# Patient Record
Sex: Female | Born: 1985 | Race: White | Hispanic: No | Smoking: Never smoker
Health system: Southern US, Community
[De-identification: ages and names within clinical notes are randomized; demographics above are authoritative.]

## PROBLEM LIST (undated history)

## (undated) ENCOUNTER — Inpatient Hospital Stay (HOSPITAL_COMMUNITY): Payer: Self-pay

## (undated) DIAGNOSIS — D649 Anemia, unspecified: Secondary | ICD-10-CM

## (undated) DIAGNOSIS — E669 Obesity, unspecified: Secondary | ICD-10-CM

## (undated) DIAGNOSIS — N83209 Unspecified ovarian cyst, unspecified side: Secondary | ICD-10-CM

## (undated) DIAGNOSIS — I10 Essential (primary) hypertension: Secondary | ICD-10-CM

## (undated) HISTORY — PX: TUBAL LIGATION: SHX77

## (undated) HISTORY — DX: Unspecified ovarian cyst, unspecified side: N83.209

## (undated) HISTORY — DX: Essential (primary) hypertension: I10

## (undated) HISTORY — DX: Obesity, unspecified: E66.9

## (undated) SURGERY — Surgical Case
Anesthesia: *Unknown

---
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
## 2009-05-16 ENCOUNTER — Emergency Department (HOSPITAL_COMMUNITY): Admission: EM | Admit: 2009-05-16 | Discharge: 2009-05-16 | Payer: Self-pay | Admitting: Emergency Medicine

## 2009-07-04 ENCOUNTER — Other Ambulatory Visit: Admission: RE | Admit: 2009-07-04 | Discharge: 2009-07-04 | Payer: Self-pay | Admitting: Obstetrics & Gynecology

## 2009-09-09 ENCOUNTER — Emergency Department (HOSPITAL_COMMUNITY): Admission: EM | Admit: 2009-09-09 | Discharge: 2009-09-09 | Payer: Self-pay | Admitting: Emergency Medicine

## 2009-12-17 ENCOUNTER — Ambulatory Visit: Payer: Self-pay | Admitting: Physician Assistant

## 2009-12-17 ENCOUNTER — Inpatient Hospital Stay (HOSPITAL_COMMUNITY): Admission: AD | Admit: 2009-12-17 | Discharge: 2009-12-17 | Payer: Self-pay | Admitting: Obstetrics and Gynecology

## 2010-01-13 ENCOUNTER — Inpatient Hospital Stay (HOSPITAL_COMMUNITY): Admission: AD | Admit: 2010-01-13 | Discharge: 2010-01-17 | Payer: Self-pay | Admitting: Obstetrics and Gynecology

## 2010-02-26 ENCOUNTER — Other Ambulatory Visit: Admission: RE | Admit: 2010-02-26 | Discharge: 2010-02-26 | Payer: Self-pay | Admitting: Obstetrics & Gynecology

## 2010-10-26 ENCOUNTER — Inpatient Hospital Stay (HOSPITAL_COMMUNITY)
Admission: AD | Admit: 2010-10-26 | Discharge: 2010-10-26 | Payer: Self-pay | Source: Home / Self Care | Attending: Family Medicine | Admitting: Family Medicine

## 2011-01-28 LAB — CBC
Hemoglobin: 13 g/dL (ref 12.0–15.0)
MCH: 29.3 pg (ref 26.0–34.0)
MCHC: 33.5 g/dL (ref 30.0–36.0)
RDW: 13.8 % (ref 11.5–15.5)

## 2011-01-28 LAB — POCT PREGNANCY, URINE: Preg Test, Ur: NEGATIVE

## 2011-02-03 LAB — URINALYSIS, ROUTINE W REFLEX MICROSCOPIC
Bilirubin Urine: NEGATIVE
Ketones, ur: NEGATIVE mg/dL
Nitrite: NEGATIVE
Protein, ur: NEGATIVE mg/dL
pH: 6 (ref 5.0–8.0)

## 2011-02-06 LAB — CBC
HCT: 35.4 % — ABNORMAL LOW (ref 36.0–46.0)
Hemoglobin: 11.9 g/dL — ABNORMAL LOW (ref 12.0–15.0)
MCHC: 33.5 g/dL (ref 30.0–36.0)
MCHC: 33.6 g/dL (ref 30.0–36.0)
MCV: 88.4 fL (ref 78.0–100.0)
Platelets: 162 10*3/uL (ref 150–400)
Platelets: 213 10*3/uL (ref 150–400)
RDW: 16 % — ABNORMAL HIGH (ref 11.5–15.5)

## 2011-02-25 LAB — CBC
Hemoglobin: 14.1 g/dL (ref 12.0–15.0)
RBC: 4.47 MIL/uL (ref 3.87–5.11)
RDW: 13.1 % (ref 11.5–15.5)
WBC: 9.9 10*3/uL (ref 4.0–10.5)

## 2011-02-25 LAB — URINALYSIS, ROUTINE W REFLEX MICROSCOPIC
Bilirubin Urine: NEGATIVE
Ketones, ur: NEGATIVE mg/dL
Nitrite: NEGATIVE
Protein, ur: NEGATIVE mg/dL
Urobilinogen, UA: 1 mg/dL (ref 0.0–1.0)

## 2011-02-25 LAB — RPR: RPR Ser Ql: NONREACTIVE

## 2011-02-25 LAB — BASIC METABOLIC PANEL
Calcium: 9.4 mg/dL (ref 8.4–10.5)
GFR calc Af Amer: >60 mL/min (ref >60)
GFR calc non Af Amer: >60 mL/min (ref >60)
Glucose, Bld: 77 mg/dL (ref 70–99)
Sodium: 136 mEq/L (ref 135–145)

## 2011-02-25 LAB — DIFFERENTIAL
Basophils Absolute: 0 10*3/uL (ref 0.0–0.1)
Lymphocytes Relative: 35 % (ref 12–46)
Monocytes Absolute: 0.6 10*3/uL (ref 0.1–1.0)
Neutro Abs: 5.7 10*3/uL (ref 1.7–7.7)

## 2011-02-25 LAB — GC/CHLAMYDIA PROBE AMP, GENITAL
Chlamydia, DNA Probe: NEGATIVE
GC Probe Amp, Genital: NEGATIVE

## 2011-02-25 LAB — PREGNANCY, URINE: Preg Test, Ur: POSITIVE

## 2012-02-28 ENCOUNTER — Other Ambulatory Visit (HOSPITAL_COMMUNITY)
Admission: RE | Admit: 2012-02-28 | Discharge: 2012-02-28 | Disposition: A | Discharge status: Home / Self Care | Payer: No Typology Code available for payment source | Source: Ambulatory Visit | Attending: Obstetrics and Gynecology | Admitting: Obstetrics and Gynecology

## 2012-02-28 ENCOUNTER — Other Ambulatory Visit: Payer: Self-pay | Admitting: Adult Health

## 2012-02-28 DIAGNOSIS — Z01419 Encounter for gynecological examination (general) (routine) without abnormal findings: Secondary | ICD-10-CM | POA: Insufficient documentation

## 2012-12-04 ENCOUNTER — Encounter (HOSPITAL_COMMUNITY): Payer: Self-pay | Admitting: Radiology

## 2012-12-04 ENCOUNTER — Emergency Department (HOSPITAL_COMMUNITY)
Admission: EM | Admit: 2012-12-04 | Discharge: 2012-12-04 | Disposition: A | Discharge status: Home / Self Care | Payer: No Typology Code available for payment source | Attending: Emergency Medicine | Admitting: Emergency Medicine

## 2012-12-04 ENCOUNTER — Emergency Department (HOSPITAL_COMMUNITY): Payer: No Typology Code available for payment source

## 2012-12-04 DIAGNOSIS — Z3202 Encounter for pregnancy test, result negative: Secondary | ICD-10-CM | POA: Insufficient documentation

## 2012-12-04 DIAGNOSIS — Y92410 Unspecified street and highway as the place of occurrence of the external cause: Secondary | ICD-10-CM | POA: Diagnosis not present

## 2012-12-04 DIAGNOSIS — S3690XA Unspecified injury of unspecified intra-abdominal organ, initial encounter: Secondary | ICD-10-CM | POA: Insufficient documentation

## 2012-12-04 DIAGNOSIS — Y9389 Activity, other specified: Secondary | ICD-10-CM | POA: Diagnosis not present

## 2012-12-04 DIAGNOSIS — S99919A Unspecified injury of unspecified ankle, initial encounter: Secondary | ICD-10-CM | POA: Diagnosis present

## 2012-12-04 DIAGNOSIS — S8990XA Unspecified injury of unspecified lower leg, initial encounter: Secondary | ICD-10-CM | POA: Diagnosis present

## 2012-12-04 DIAGNOSIS — T148XXA Other injury of unspecified body region, initial encounter: Secondary | ICD-10-CM | POA: Diagnosis not present

## 2012-12-04 DIAGNOSIS — S298XXA Other specified injuries of thorax, initial encounter: Secondary | ICD-10-CM | POA: Insufficient documentation

## 2012-12-04 LAB — URINALYSIS, ROUTINE W REFLEX MICROSCOPIC
Bilirubin Urine: NEGATIVE
Glucose, UA: NEGATIVE mg/dL
Ketones, ur: NEGATIVE mg/dL
Leukocytes, UA: NEGATIVE
Protein, ur: NEGATIVE mg/dL
pH: 6 (ref 5.0–8.0)

## 2012-12-04 LAB — POCT PREGNANCY, URINE: Preg Test, Ur: NEGATIVE

## 2012-12-04 MED ORDER — OXYCODONE-ACETAMINOPHEN 5-325 MG PO TABS: 2.0000 | ORAL_TABLET | ORAL | Status: DC | PRN

## 2012-12-04 MED ORDER — CYCLOBENZAPRINE HCL 10 MG PO TABS: 10.0000 mg | ORAL_TABLET | Freq: Two times a day (BID) | ORAL | Status: DC | PRN

## 2012-12-04 MED ORDER — KETOROLAC TROMETHAMINE 30 MG/ML IJ SOLN
30.0000 mg | Freq: Once | INTRAMUSCULAR | Status: AC
  Administered 2012-12-04: 30 mg via INTRAVENOUS
  Filled 2012-12-04: qty 1

## 2012-12-04 MED ORDER — IOHEXOL 300 MG/ML  SOLN
100.0000 mL | Freq: Once | INTRAMUSCULAR | Status: AC | PRN
  Administered 2012-12-04: 100 mL via INTRAVENOUS

## 2012-12-04 MED ORDER — DIAZEPAM 5 MG/ML IJ SOLN
5.0000 mg | Freq: Once | INTRAMUSCULAR | Status: AC
  Administered 2012-12-04: 5 mg via INTRAVENOUS
  Filled 2012-12-04: qty 2

## 2012-12-04 MED ORDER — OXYCODONE-ACETAMINOPHEN 5-325 MG PO TABS
2.0000 | ORAL_TABLET | Freq: Once | ORAL | Status: AC
  Administered 2012-12-04: 2 via ORAL
  Filled 2012-12-04: qty 2

## 2012-12-04 NOTE — ED Notes (Signed)
Pt transported to CT ?

## 2012-12-04 NOTE — ED Notes (Signed)
PA at bedside.

## 2012-12-04 NOTE — ED Provider Notes (Signed)
Medical screening examination/treatment/procedure(s) were conducted as a shared visit with non-physician practitioner(s) and myself.  I personally evaluated the patient during the encounter  See my additional note  Glynn Octave, MD 12/04/12 1630
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
## 2012-12-06 ENCOUNTER — Encounter (HOSPITAL_COMMUNITY): Payer: Self-pay | Admitting: Nurse Practitioner

## 2012-12-06 ENCOUNTER — Emergency Department (HOSPITAL_COMMUNITY)
Admission: EM | Admit: 2012-12-06 | Discharge: 2012-12-06 | Disposition: A | Discharge status: Home / Self Care | Payer: No Typology Code available for payment source | Attending: Emergency Medicine | Admitting: Emergency Medicine

## 2012-12-06 DIAGNOSIS — G44309 Post-traumatic headache, unspecified, not intractable: Secondary | ICD-10-CM | POA: Insufficient documentation

## 2012-12-06 DIAGNOSIS — R51 Headache: Secondary | ICD-10-CM

## 2012-12-06 DIAGNOSIS — R5381 Other malaise: Secondary | ICD-10-CM | POA: Insufficient documentation

## 2012-12-06 DIAGNOSIS — Z3202 Encounter for pregnancy test, result negative: Secondary | ICD-10-CM | POA: Insufficient documentation

## 2012-12-06 LAB — URINALYSIS, ROUTINE W REFLEX MICROSCOPIC
Glucose, UA: NEGATIVE mg/dL
Ketones, ur: NEGATIVE mg/dL
Leukocytes, UA: NEGATIVE
Nitrite: NEGATIVE
Specific Gravity, Urine: 1.016 (ref 1.005–1.030)
pH: 8 (ref 5.0–8.0)

## 2012-12-06 LAB — POCT PREGNANCY, URINE: Preg Test, Ur: NEGATIVE

## 2012-12-06 MED ORDER — DIPHENHYDRAMINE HCL 50 MG/ML IJ SOLN
25.0000 mg | Freq: Once | INTRAMUSCULAR | Status: AC
  Administered 2012-12-06: 25 mg via INTRAVENOUS
  Filled 2012-12-06: qty 1

## 2012-12-06 MED ORDER — METOCLOPRAMIDE HCL 5 MG/ML IJ SOLN
10.0000 mg | Freq: Once | INTRAMUSCULAR | Status: AC
  Administered 2012-12-06: 10 mg via INTRAVENOUS
  Filled 2012-12-06: qty 2

## 2012-12-06 MED ORDER — SODIUM CHLORIDE 0.9 % IV BOLUS (SEPSIS)
1000.0000 mL | Freq: Once | INTRAVENOUS | Status: AC
  Administered 2012-12-06: 1000 mL via INTRAVENOUS

## 2012-12-06 MED ORDER — KETOROLAC TROMETHAMINE 30 MG/ML IJ SOLN
30.0000 mg | Freq: Once | INTRAMUSCULAR | Status: AC
  Administered 2012-12-06: 30 mg via INTRAVENOUS
  Filled 2012-12-06: qty 1
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
## 2012-12-06 NOTE — ED Notes (Signed)
Discharge instructions reviewed. Pt verbalized understanding.  

## 2012-12-07 ENCOUNTER — Telehealth (HOSPITAL_COMMUNITY): Payer: Self-pay | Admitting: Orthopedic Surgery
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
## 2012-12-10 NOTE — ED Provider Notes (Signed)
Medical screening examination/treatment/procedure(s) were performed by non-physician practitioner and as supervising physician I was immediately available for consultation/collaboration.   Loren Racer, MD 12/10/12 1505

## 2012-12-14 NOTE — ED Provider Notes (Signed)
Medical screening examination/treatment/procedure(s) were performed by non-physician practitioner and as supervising physician I was immediately available for consultation/collaboration.   Carleene Cooper III, MD 12/14/12 2025

## 2013-01-05 LAB — OB RESULTS CONSOLE HIV ANTIBODY (ROUTINE TESTING): HIV: NONREACTIVE

## 2013-01-05 LAB — OB RESULTS CONSOLE ABO/RH: RH Type: POSITIVE

## 2013-01-05 LAB — OB RESULTS CONSOLE RPR: RPR: NONREACTIVE

## 2013-01-12 LAB — OB RESULTS CONSOLE GBS: GBS: POSITIVE

## 2013-01-19 ENCOUNTER — Encounter: Payer: Self-pay | Admitting: *Deleted

## 2013-01-25 ENCOUNTER — Other Ambulatory Visit: Payer: Self-pay | Admitting: Obstetrics & Gynecology

## 2013-01-25 DIAGNOSIS — Z36 Encounter for antenatal screening of mother: Secondary | ICD-10-CM

## 2013-02-02 ENCOUNTER — Other Ambulatory Visit: Payer: Self-pay | Admitting: Advanced Practice Midwife

## 2013-02-02 ENCOUNTER — Ambulatory Visit (INDEPENDENT_AMBULATORY_CARE_PROVIDER_SITE_OTHER): Payer: No Typology Code available for payment source | Admitting: Advanced Practice Midwife

## 2013-02-02 ENCOUNTER — Ambulatory Visit (INDEPENDENT_AMBULATORY_CARE_PROVIDER_SITE_OTHER): Payer: No Typology Code available for payment source

## 2013-02-02 ENCOUNTER — Encounter: Payer: Self-pay | Admitting: Advanced Practice Midwife

## 2013-02-02 VITALS — BP 122/64 | Wt 180.8 lb

## 2013-02-02 DIAGNOSIS — Z1389 Encounter for screening for other disorder: Secondary | ICD-10-CM

## 2013-02-02 DIAGNOSIS — O10919 Unspecified pre-existing hypertension complicating pregnancy, unspecified trimester: Secondary | ICD-10-CM | POA: Insufficient documentation

## 2013-02-02 DIAGNOSIS — O10911 Unspecified pre-existing hypertension complicating pregnancy, first trimester: Secondary | ICD-10-CM

## 2013-02-02 DIAGNOSIS — O34219 Maternal care for unspecified type scar from previous cesarean delivery: Secondary | ICD-10-CM

## 2013-02-02 DIAGNOSIS — Z3491 Encounter for supervision of normal pregnancy, unspecified, first trimester: Secondary | ICD-10-CM

## 2013-02-02 DIAGNOSIS — O10019 Pre-existing essential hypertension complicating pregnancy, unspecified trimester: Secondary | ICD-10-CM

## 2013-02-02 DIAGNOSIS — O99891 Other specified diseases and conditions complicating pregnancy: Secondary | ICD-10-CM

## 2013-02-02 DIAGNOSIS — Z331 Pregnant state, incidental: Secondary | ICD-10-CM

## 2013-02-02 DIAGNOSIS — Z36 Encounter for antenatal screening of mother: Secondary | ICD-10-CM

## 2013-02-02 LAB — POCT URINALYSIS DIPSTICK
Glucose, UA: NEGATIVE
Leukocytes, UA: NEGATIVE
Protein, UA: NEGATIVE

## 2013-02-02 NOTE — Progress Notes (Signed)
Pt reports swelling in the hands.

## 2013-02-02 NOTE — Addendum Note (Signed)
Addended by: Jacklyn Shell on: 02/02/2013 05:44 PM   Modules accepted: Orders

## 2013-02-03 NOTE — Progress Notes (Signed)
No c/o at this time.  Routine questions about pregnancy andswered.  F/U in 4 weeks for pnv and 2nd IT.

## 2013-02-07 LAB — MATERNAL SCREEN, INTEGRATED #1
Number of fetuses: 1
Referring Physician NPI: 1881783975

## 2013-03-02 ENCOUNTER — Ambulatory Visit (INDEPENDENT_AMBULATORY_CARE_PROVIDER_SITE_OTHER): Payer: BC Managed Care – PPO | Admitting: Advanced Practice Midwife

## 2013-03-02 ENCOUNTER — Other Ambulatory Visit: Payer: Self-pay | Admitting: Advanced Practice Midwife

## 2013-03-02 VITALS — BP 102/78 | Wt 184.5 lb

## 2013-03-02 DIAGNOSIS — O10911 Unspecified pre-existing hypertension complicating pregnancy, first trimester: Secondary | ICD-10-CM

## 2013-03-02 DIAGNOSIS — O99891 Other specified diseases and conditions complicating pregnancy: Secondary | ICD-10-CM

## 2013-03-02 DIAGNOSIS — O34219 Maternal care for unspecified type scar from previous cesarean delivery: Secondary | ICD-10-CM

## 2013-03-02 DIAGNOSIS — O09519 Supervision of elderly primigravida, unspecified trimester: Secondary | ICD-10-CM

## 2013-03-02 DIAGNOSIS — O10019 Pre-existing essential hypertension complicating pregnancy, unspecified trimester: Secondary | ICD-10-CM

## 2013-03-02 LAB — POCT URINALYSIS DIPSTICK
Glucose, UA: NEGATIVE
Nitrite, UA: NEGATIVE

## 2013-03-02 NOTE — Progress Notes (Signed)
Occ nosebleeds.  Try saline spray.  Feels jittery at times, several hours at a time. ? Hypoglycemia.  Instructed to seek care during an episode. Had 2nd IT today Routine questions about pregnancy answered.  F/U in 3 weeks for anatomy scan.

## 2013-03-02 NOTE — Progress Notes (Signed)
C/o nose bleeds and "don't feel right and jittery"

## 2013-03-08 LAB — MATERNAL SCREEN, INTEGRATED #2
AFP MoM: 0.85
Crown Rump Length: 64.7 mm
Estriol Mom: 0.94
Estriol, Free: 0.59 ng/mL
MSS Trisomy 18 Risk: <1:5000 {titer}
Nuchal Translucency: 1.61 mm
PAPP-A MoM: 0.58
hCG, Serum: 34.3 IU/mL

## 2013-03-11 ENCOUNTER — Encounter (HOSPITAL_COMMUNITY): Payer: Self-pay | Admitting: *Deleted

## 2013-03-11 ENCOUNTER — Telehealth: Payer: Self-pay | Admitting: *Deleted

## 2013-03-11 ENCOUNTER — Inpatient Hospital Stay (HOSPITAL_COMMUNITY)
Admission: AD | Admit: 2013-03-11 | Discharge: 2013-03-11 | Disposition: A | Discharge status: Home / Self Care | Payer: BC Managed Care – PPO | Source: Ambulatory Visit | Attending: Obstetrics & Gynecology | Admitting: Obstetrics & Gynecology

## 2013-03-11 DIAGNOSIS — O10019 Pre-existing essential hypertension complicating pregnancy, unspecified trimester: Secondary | ICD-10-CM | POA: Insufficient documentation

## 2013-03-11 DIAGNOSIS — O10911 Unspecified pre-existing hypertension complicating pregnancy, first trimester: Secondary | ICD-10-CM

## 2013-03-11 DIAGNOSIS — N949 Unspecified condition associated with female genital organs and menstrual cycle: Secondary | ICD-10-CM

## 2013-03-11 DIAGNOSIS — O34219 Maternal care for unspecified type scar from previous cesarean delivery: Secondary | ICD-10-CM

## 2013-03-11 DIAGNOSIS — O99891 Other specified diseases and conditions complicating pregnancy: Secondary | ICD-10-CM | POA: Insufficient documentation

## 2013-03-11 DIAGNOSIS — R109 Unspecified abdominal pain: Secondary | ICD-10-CM | POA: Insufficient documentation

## 2013-03-11 LAB — URINALYSIS, ROUTINE W REFLEX MICROSCOPIC
Hgb urine dipstick: NEGATIVE
Leukocytes, UA: NEGATIVE
Nitrite: NEGATIVE
Specific Gravity, Urine: 1.025 (ref 1.005–1.030)
Urobilinogen, UA: 0.2 mg/dL (ref 0.0–1.0)

## 2013-03-11 NOTE — Telephone Encounter (Signed)
Pt c/o cramping at 17 weeks of pregnancy, no bleeding. Informed pt to try OTC Tylenol, push fluids and to rest if no improvement to call office back, Pt informed to go to MAU with severe cramping and bleeding were to occur. Pt verbalized understanding.
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
## 2013-03-11 NOTE — MAU Note (Signed)
Pt reports having some cramping on and off since yesterday. Cramping is strong today. Denies vag bleeding or discharge at this time.

## 2013-03-25 ENCOUNTER — Encounter: Payer: Self-pay | Admitting: Advanced Practice Midwife

## 2013-03-25 ENCOUNTER — Ambulatory Visit (INDEPENDENT_AMBULATORY_CARE_PROVIDER_SITE_OTHER): Payer: BC Managed Care – PPO | Admitting: Advanced Practice Midwife

## 2013-03-25 ENCOUNTER — Ambulatory Visit (INDEPENDENT_AMBULATORY_CARE_PROVIDER_SITE_OTHER): Payer: BC Managed Care – PPO

## 2013-03-25 ENCOUNTER — Other Ambulatory Visit: Payer: Self-pay | Admitting: Advanced Practice Midwife

## 2013-03-25 VITALS — BP 120/90 | Wt 187.0 lb

## 2013-03-25 DIAGNOSIS — O10012 Pre-existing essential hypertension complicating pregnancy, second trimester: Secondary | ICD-10-CM

## 2013-03-25 DIAGNOSIS — O0990 Supervision of high risk pregnancy, unspecified, unspecified trimester: Secondary | ICD-10-CM | POA: Insufficient documentation

## 2013-03-25 DIAGNOSIS — Z1389 Encounter for screening for other disorder: Secondary | ICD-10-CM

## 2013-03-25 DIAGNOSIS — O34219 Maternal care for unspecified type scar from previous cesarean delivery: Secondary | ICD-10-CM

## 2013-03-25 DIAGNOSIS — O162 Unspecified maternal hypertension, second trimester: Secondary | ICD-10-CM

## 2013-03-25 DIAGNOSIS — O09519 Supervision of elderly primigravida, unspecified trimester: Secondary | ICD-10-CM

## 2013-03-25 DIAGNOSIS — Z3492 Encounter for supervision of normal pregnancy, unspecified, second trimester: Secondary | ICD-10-CM

## 2013-03-25 DIAGNOSIS — Z331 Pregnant state, incidental: Secondary | ICD-10-CM

## 2013-03-25 DIAGNOSIS — O0992 Supervision of high risk pregnancy, unspecified, second trimester: Secondary | ICD-10-CM

## 2013-03-25 DIAGNOSIS — O99891 Other specified diseases and conditions complicating pregnancy: Secondary | ICD-10-CM

## 2013-03-25 DIAGNOSIS — O10019 Pre-existing essential hypertension complicating pregnancy, unspecified trimester: Secondary | ICD-10-CM

## 2013-03-25 DIAGNOSIS — O10912 Unspecified pre-existing hypertension complicating pregnancy, second trimester: Secondary | ICD-10-CM

## 2013-03-25 DIAGNOSIS — O10911 Unspecified pre-existing hypertension complicating pregnancy, first trimester: Secondary | ICD-10-CM

## 2013-03-25 LAB — POCT URINALYSIS DIPSTICK
Blood, UA: NEGATIVE
Protein, UA: NEGATIVE

## 2013-03-25 NOTE — Progress Notes (Signed)
U/S-19+4wks, active fetus, meas c/w dates, cx long and closed, fluid wnl, no major abnl noted,ant gr 0 plac,  Female fetus, bilateral adnexa wnl

## 2013-03-25 NOTE — Progress Notes (Signed)
"  Belly pain."

## 2013-03-25 NOTE — Progress Notes (Signed)
Had anatomy scan today. Still with round ligament pain,  Better with extra water.   DIdn't take BP med this am.  No c/o at this time.  Routine questions about pregnancy answered.  F/U in 4 weeks for growth ultrasound.

## 2013-03-29 ENCOUNTER — Telehealth: Payer: Self-pay | Admitting: Obstetrics and Gynecology

## 2013-03-29 NOTE — Telephone Encounter (Signed)
Pt states had stomach bug this past weekend, unable to keep anything down, called after nurse line and was given Zofran. Pt informed ok to take Zofran during pregnancy, to push fluids to prevent dehydration. Pt states Zofran is helping. Pt told to call office back if anymore complications. Pt verbalized understanding.

## 2013-03-30 LAB — US OB DETAIL + 14 WK

## 2013-04-18 ENCOUNTER — Encounter (HOSPITAL_COMMUNITY): Payer: Self-pay | Admitting: *Deleted

## 2013-04-18 ENCOUNTER — Inpatient Hospital Stay (HOSPITAL_COMMUNITY)
Admission: AD | Admit: 2013-04-18 | Discharge: 2013-04-18 | Disposition: A | Discharge status: Home / Self Care | Payer: BC Managed Care – PPO | Source: Ambulatory Visit | Attending: Obstetrics and Gynecology | Admitting: Obstetrics and Gynecology

## 2013-04-18 DIAGNOSIS — N949 Unspecified condition associated with female genital organs and menstrual cycle: Secondary | ICD-10-CM | POA: Insufficient documentation

## 2013-04-18 DIAGNOSIS — O10019 Pre-existing essential hypertension complicating pregnancy, unspecified trimester: Secondary | ICD-10-CM | POA: Insufficient documentation

## 2013-04-18 DIAGNOSIS — O99891 Other specified diseases and conditions complicating pregnancy: Secondary | ICD-10-CM | POA: Insufficient documentation

## 2013-04-18 DIAGNOSIS — O10912 Unspecified pre-existing hypertension complicating pregnancy, second trimester: Secondary | ICD-10-CM

## 2013-04-18 DIAGNOSIS — N898 Other specified noninflammatory disorders of vagina: Secondary | ICD-10-CM

## 2013-04-18 DIAGNOSIS — O26892 Other specified pregnancy related conditions, second trimester: Secondary | ICD-10-CM

## 2013-04-18 DIAGNOSIS — O0992 Supervision of high risk pregnancy, unspecified, second trimester: Secondary | ICD-10-CM

## 2013-04-18 DIAGNOSIS — O34219 Maternal care for unspecified type scar from previous cesarean delivery: Secondary | ICD-10-CM

## 2013-04-18 LAB — URINALYSIS, ROUTINE W REFLEX MICROSCOPIC
Glucose, UA: NEGATIVE mg/dL
Hgb urine dipstick: NEGATIVE
Ketones, ur: NEGATIVE mg/dL
Protein, ur: NEGATIVE mg/dL

## 2013-04-18 LAB — WET PREP, GENITAL: Yeast Wet Prep HPF POC: NONE SEEN

## 2013-04-18 NOTE — MAU Provider Note (Signed)
History     CSN: [id number]  Arrival date and time: 04/18/13 1804   None     Chief Complaint  Patient presents with  . Vaginal Discharge   HPI 27 y.o. G2P1001 at [redacted]w[redacted]d here for leaking of fluid over last few days. Feels her underwear has been wet past two days. No gush of fluid. Last intercourse 2 days ago. No itching, dysuria, bleeding or cramping/contractions. Baby moving well.   Gets care at [facility]. History chronic HTN, prior c-section for arrest of descent/dilation. No other complications of pregnancy.  OB History   Grav Para Term Preterm Abortions TAB SAB Ect Mult Living   2 1 1       1       Past Medical History  Diagnosis Date  . Hypertension     Past Surgical History  Procedure Laterality Date  . Cesarean section      Family History  Problem Relation Age of Onset  . Hypertension Mother   . Cancer Mother     skin  . Cancer Maternal Grandmother   . Hypertension Maternal Grandmother   . Cancer Other     breast    History  Substance Use Topics  . Smoking status: Never Smoker   . Smokeless tobacco: Not on file  . Alcohol Use: No    Allergies: No Known Allergies  Prescriptions prior to admission  Medication Sig Dispense Refill  . acetaminophen (TYLENOL) 500 MG tablet Take 1,000 mg by mouth every 6 (six) hours as needed for pain.      . calcium carbonate (TUMS - DOSED IN MG ELEMENTAL CALCIUM) 500 MG chewable tablet Chew 2-4 tablets by mouth 2 (two) times daily as needed for heartburn.      . methyldopa (ALDOMET) 500 MG tablet Take 500 mg by mouth 2 (two) times daily.       . prenatal vitamin w/FE, FA (PRENATAL 1 + 1) 27-1 MG TABS Take 1 tablet by mouth daily at 12 noon.        ROS See HPI   Physical Exam   Blood pressure 114/68, pulse 86, temperature 97.7 F (36.5 C), temperature source Oral, resp. rate 18, weight 88.451 kg (195 lb), last menstrual period 11/08/2012.  Physical Exam  Constitutional: She is oriented to person, place, and time.  She appears well-developed and well-nourished. No distress.  HENT:  Head: Normocephalic and atraumatic.  Eyes: Conjunctivae and EOM are normal.  Neck: Normal range of motion. Neck supple.  Cardiovascular: Normal rate.   Respiratory: Effort normal. No respiratory distress.  GI: Soft. There is no tenderness. There is no rebound and no guarding.  Genitourinary:  Normal external genitalia. Normal vagina, minimal discharge, no blood. Clear/white mucous at cervical os. Cervix appears closed and is closed/thick/high on digital exam. No CMT.  Musculoskeletal: Normal range of motion. She exhibits no edema and no tenderness.  Neurological: She is alert and oriented to person, place, and time.  Skin: Skin is warm and dry.  Psychiatric: She has a normal mood and affect.   Results for orders placed during the hospital encounter of 04/18/13 (from the past 24 hour(s))  URINALYSIS, ROUTINE W REFLEX MICROSCOPIC     Status: Abnormal   Collection Time    04/18/13  6:20 PM      Result Value Range   Color, Urine YELLOW  YELLOW   APPearance CLEAR  CLEAR   Specific Gravity, Urine >1.030 (*) 1.005 - 1.030   pH 6.0  5.0 -  8.0   Glucose, UA NEGATIVE  NEGATIVE mg/dL   Hgb urine dipstick NEGATIVE  NEGATIVE   Bilirubin Urine NEGATIVE  NEGATIVE   Ketones, ur NEGATIVE  NEGATIVE mg/dL   Protein, ur NEGATIVE  NEGATIVE mg/dL   Urobilinogen, UA 0.2  0.0 - 1.0 mg/dL   Nitrite NEGATIVE  NEGATIVE   Leukocytes, UA NEGATIVE  NEGATIVE  WET PREP, GENITAL     Status: Abnormal   Collection Time    04/18/13  6:40 PM      Result Value Range   Yeast Wet Prep HPF POC NONE SEEN  NONE SEEN   Trich, Wet Prep NONE SEEN  NONE SEEN   Clue Cells Wet Prep HPF POC NONE SEEN  NONE SEEN   WBC, Wet Prep HPF POC FEW (*) NONE SEEN    FHTs:  Baseline: 140 Variability:  Good (1-6 bpm) Accelerations:  Present    Decelerations:  none TOCO:  No ctx   MAU Course  Procedures   Assessment and Plan  27 y.o. G2P1001 at [redacted]w[redacted]d with  -  Normal vaginal secretions - Reassurance - F/U at [facility] as scheduled   Napoleon Form 04/18/2013, 6:31 PM

## 2013-04-18 NOTE — MAU Note (Signed)
?   leaking, first noted 2 days ago.  No bleeding. No pain.  Leaking continued off and on.

## 2013-04-19 LAB — GC/CHLAMYDIA PROBE AMP: CT Probe RNA: NEGATIVE

## 2013-04-20 NOTE — MAU Provider Note (Signed)
Attestation of Attending Supervision of Advanced Practitioner: Evaluation and management procedures were performed by the PA/NP/CNM/OB Fellow under my supervision/collaboration. Chart reviewed and agree with management and plan.  Kirstie Larsen V 04/20/2013 1:50 PM

## 2013-04-21 ENCOUNTER — Telehealth: Payer: Self-pay | Admitting: Obstetrics and Gynecology

## 2013-04-21 NOTE — Telephone Encounter (Signed)
Spoke with pt. Thinks she saw blood in urine. Just noticed it 1 time. No urinary symptoms, no cramping. Advised to push fluids and keep appt for tomorrow. Pt voiced understanding. JSY

## 2013-04-22 ENCOUNTER — Ambulatory Visit (INDEPENDENT_AMBULATORY_CARE_PROVIDER_SITE_OTHER): Payer: BC Managed Care – PPO

## 2013-04-22 ENCOUNTER — Ambulatory Visit (INDEPENDENT_AMBULATORY_CARE_PROVIDER_SITE_OTHER): Payer: BC Managed Care – PPO | Admitting: Advanced Practice Midwife

## 2013-04-22 ENCOUNTER — Encounter: Payer: Self-pay | Admitting: Advanced Practice Midwife

## 2013-04-22 VITALS — BP 110/80 | Wt 195.5 lb

## 2013-04-22 DIAGNOSIS — O10019 Pre-existing essential hypertension complicating pregnancy, unspecified trimester: Secondary | ICD-10-CM

## 2013-04-22 DIAGNOSIS — Z1389 Encounter for screening for other disorder: Secondary | ICD-10-CM

## 2013-04-22 DIAGNOSIS — O99891 Other specified diseases and conditions complicating pregnancy: Secondary | ICD-10-CM

## 2013-04-22 DIAGNOSIS — O34219 Maternal care for unspecified type scar from previous cesarean delivery: Secondary | ICD-10-CM

## 2013-04-22 DIAGNOSIS — O10913 Unspecified pre-existing hypertension complicating pregnancy, third trimester: Secondary | ICD-10-CM

## 2013-04-22 DIAGNOSIS — O10912 Unspecified pre-existing hypertension complicating pregnancy, second trimester: Secondary | ICD-10-CM

## 2013-04-22 DIAGNOSIS — Z331 Pregnant state, incidental: Secondary | ICD-10-CM

## 2013-04-22 DIAGNOSIS — O162 Unspecified maternal hypertension, second trimester: Secondary | ICD-10-CM

## 2013-04-22 DIAGNOSIS — O169 Unspecified maternal hypertension, unspecified trimester: Secondary | ICD-10-CM

## 2013-04-22 LAB — POCT URINALYSIS DIPSTICK
Blood, UA: NEGATIVE
Glucose, UA: NEGATIVE
Ketones, UA: NEGATIVE

## 2013-04-22 NOTE — Progress Notes (Signed)
Having some pressure. Had growth u/s (42%).  Cx 4.3 cms long per u/s.    No c/o at this time.  Routine questions about pregnancy answered.  F/U in 4 weeks for growth us/PN2

## 2013-04-22 NOTE — Patient Instructions (Addendum)
Nothing to eat or drink after midnight before your next appointment & plan to be here for 2 hours (for your sugar test).  

## 2013-04-22 NOTE — Progress Notes (Signed)
U/S at 23+ wks: active fetus, meas c/w dates - approp growth, ant high gr 0 plac is 7+ cm superior to internal os, cx long and closed, fluid WNL, anat reviewed - no structural abnl identified in female fetus

## 2013-05-03 ENCOUNTER — Ambulatory Visit (INDEPENDENT_AMBULATORY_CARE_PROVIDER_SITE_OTHER): Payer: BC Managed Care – PPO | Admitting: Women's Health

## 2013-05-03 ENCOUNTER — Encounter: Payer: Self-pay | Admitting: Women's Health

## 2013-05-03 VITALS — BP 110/80 | Wt 196.0 lb

## 2013-05-03 DIAGNOSIS — Z3482 Encounter for supervision of other normal pregnancy, second trimester: Secondary | ICD-10-CM

## 2013-05-03 DIAGNOSIS — O34219 Maternal care for unspecified type scar from previous cesarean delivery: Secondary | ICD-10-CM

## 2013-05-03 DIAGNOSIS — Z1389 Encounter for screening for other disorder: Secondary | ICD-10-CM

## 2013-05-03 DIAGNOSIS — Z331 Pregnant state, incidental: Secondary | ICD-10-CM

## 2013-05-03 DIAGNOSIS — O10019 Pre-existing essential hypertension complicating pregnancy, unspecified trimester: Secondary | ICD-10-CM

## 2013-05-03 DIAGNOSIS — O99891 Other specified diseases and conditions complicating pregnancy: Secondary | ICD-10-CM

## 2013-05-03 LAB — POCT URINALYSIS DIPSTICK
Ketones, UA: NEGATIVE
Leukocytes, UA: NEGATIVE

## 2013-05-03 NOTE — Progress Notes (Addendum)
Here as work in. Reports good fm. Denies lof, vb, urinary frequency, urgency, hesitancy, or dysuria.  Reports pelvic pressure, swelling/pain, some LBP.  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 Requests note for decrease in hours-note printed for pt. Spec exam cx visually closed, SVE: outer os 1, inner os closed/th/-3. Reviewed ptl s/s, fm, s/s to report.  All questions answered. F/U 7/3 as scheduled for PN2 and visit.

## 2013-05-03 NOTE — Patient Instructions (Signed)
You will have your sugar test next visit.  Please do not eat or drink anything after midnight the night before you come, not even water.  You will be here for at least two hours.     Preterm Labor Preterm labor is when labor starts at less than 37 weeks of pregnancy. The normal length of a pregnancy is 39 to 41 weeks. CAUSES Often, there is no identifiable underlying cause as to why a woman goes into preterm labor. However, one of the most common known causes of preterm labor is infection. Infections of the uterus, cervix, vagina, amniotic sac, bladder, kidney, or even the lungs (pneumonia) can cause labor to start. Other causes of preterm labor include:  Urogenital infections, such as yeast infections and bacterial vaginosis.  Uterine abnormalities (uterine shape, uterine septum, fibroids, bleeding from the placenta).  A cervix that has been operated on and opens prematurely.  Malformations in the baby.  Multiple gestations (twins, triplets, and so on).  Breakage of the amniotic sac. Additional risk factors for preterm labor include:  Previous history of preterm labor.  Premature rupture of membranes (PROM).  A placenta that covers the opening of the cervix (placenta previa).  A placenta that separates from the uterus (placenta abruption).  A cervix that is too weak to hold the baby in the uterus (incompetence cervix).  Having too much fluid in the amniotic sac (polyhydramnios).  Taking illegal drugs or smoking while pregnant.  Not gaining enough weight while pregnant.  Women younger than 18 and older than 25 years old.  Low socioeconomic status.  African-American ethnicity. SYMPTOMS Signs and symptoms of preterm labor include:  Menstrual-like cramps.  Contractions that are 30 to 70 seconds apart, become very regular, closer together, and are more intense and painful.  Contractions that start on the top of the uterus and spread down to the lower abdomen and back.  A  sense of increased pelvic pressure or back pain.  A watery or bloody discharge that comes from the vagina. DIAGNOSIS  A diagnosis can be confirmed by:  A vaginal exam.  An ultrasound of the cervix.  Sampling (swabbing) cervico-vaginal secretions. These samples can be tested for the presence of fetal fibronectin. This is a protein found in cervical discharge which is associated with preterm labor.  Fetal monitoring. TREATMENT  Depending on the length of the pregnancy and other circumstances, a caregiver may suggest bed rest. If necessary, there are medicines that can be given to stop contractions and to quicken fetal lung maturity. If labor happens before 34 weeks of pregnancy, a prolonged hospital stay may be recommended. Treatment depends on the condition of both the mother and baby. PREVENTION There are some things a mother can do to lower the risk of preterm labor in future pregnancies. A woman can:   Stop smoking.  Maintain healthy weight gain and avoid chemicals and drugs that are not necessary.  Be watchful for any type of infection.  Inform her caregiver if she has a known history of preterm labor. Document Released: 01/25/2004 Document Revised: 01/27/2012 Document Reviewed: 03/01/2011 Exit[employer] Patient Information 2014 [employer].  

## 2013-05-03 NOTE — Progress Notes (Signed)
Pain in stomach and in vaginal area.

## 2013-05-20 ENCOUNTER — Other Ambulatory Visit: Payer: Self-pay | Admitting: Advanced Practice Midwife

## 2013-05-20 ENCOUNTER — Ambulatory Visit (INDEPENDENT_AMBULATORY_CARE_PROVIDER_SITE_OTHER): Payer: BC Managed Care – PPO | Admitting: Obstetrics and Gynecology

## 2013-05-20 ENCOUNTER — Ambulatory Visit (INDEPENDENT_AMBULATORY_CARE_PROVIDER_SITE_OTHER): Payer: BC Managed Care – PPO

## 2013-05-20 ENCOUNTER — Other Ambulatory Visit: Payer: BC Managed Care – PPO

## 2013-05-20 VITALS — BP 120/82 | Wt 200.8 lb

## 2013-05-20 DIAGNOSIS — Z3483 Encounter for supervision of other normal pregnancy, third trimester: Secondary | ICD-10-CM

## 2013-05-20 DIAGNOSIS — O34219 Maternal care for unspecified type scar from previous cesarean delivery: Secondary | ICD-10-CM

## 2013-05-20 DIAGNOSIS — O99891 Other specified diseases and conditions complicating pregnancy: Secondary | ICD-10-CM

## 2013-05-20 DIAGNOSIS — O10019 Pre-existing essential hypertension complicating pregnancy, unspecified trimester: Secondary | ICD-10-CM

## 2013-05-20 DIAGNOSIS — O10913 Unspecified pre-existing hypertension complicating pregnancy, third trimester: Secondary | ICD-10-CM

## 2013-05-20 DIAGNOSIS — Z3493 Encounter for supervision of normal pregnancy, unspecified, third trimester: Secondary | ICD-10-CM

## 2013-05-20 DIAGNOSIS — Z1389 Encounter for screening for other disorder: Secondary | ICD-10-CM

## 2013-05-20 DIAGNOSIS — O169 Unspecified maternal hypertension, unspecified trimester: Secondary | ICD-10-CM

## 2013-05-20 DIAGNOSIS — Z331 Pregnant state, incidental: Secondary | ICD-10-CM

## 2013-05-20 LAB — POCT URINALYSIS DIPSTICK
Blood, UA: NEGATIVE
Glucose, UA: NEGATIVE
Protein, UA: NEGATIVE

## 2013-05-20 LAB — CBC
HCT: 34 % — ABNORMAL LOW (ref 36.0–46.0)
Hemoglobin: 11.8 g/dL — ABNORMAL LOW (ref 12.0–15.0)
MCV: 86.7 fL (ref 78.0–100.0)
RBC: 3.92 MIL/uL (ref 3.87–5.11)
WBC: 10.9 10*3/uL — ABNORMAL HIGH (ref 4.0–10.5)

## 2013-05-20 LAB — RPR

## 2013-05-20 NOTE — Patient Instructions (Signed)
kFetal Movement Counts Patient Name: __________________________________________________ Patient Due Date: ____________________ Performing a fetal movement count is highly recommended in high-risk pregnancies, but it is good for every pregnant woman to do. Your caregiver may ask you to start counting fetal movements at 28 weeks of the pregnancy. Fetal movements often increase:  After eating a full meal.  After physical activity.  After eating or drinking something sweet or cold.  At rest. Pay attention to when you feel the baby is most active. This will help you notice a pattern of your baby's sleep and wake cycles and what factors contribute to an increase in fetal movement. It is important to perform a fetal movement count at the same time each day when your baby is normally most active.  HOW TO COUNT FETAL MOVEMENTS 1. Find a quiet and comfortable area to sit or lie down on your left side. Lying on your left side provides the best blood and oxygen circulation to your baby. 2. Write down the day and time on a sheet of paper or in a journal. 3. Start counting kicks, flutters, swishes, rolls, or jabs in a 2 hour period. You should feel at least 10 movements within 2 hours. 4. If you do not feel 10 movements in 2 hours, wait 2 3 hours and count again. Look for a change in the pattern or not enough counts in 2 hours. SEEK MEDICAL CARE IF:  You feel less than 10 counts in 2 hours, tried twice.  There is no movement in over an hour.  The pattern is changing or taking longer each day to reach 10 counts in 2 hours.  You feel the baby is not moving as he or she usually does. Date: ____________ Movements: ____________ Start time: ____________ Doreatha Martin time: ____________  Date: ____________ Movements: ____________ Start time: ____________ Doreatha Martin time: ____________ Date: ____________ Movements: ____________ Start time: ____________ Doreatha Martin time: ____________ Date: ____________ Movements: ____________  Start time: ____________ Doreatha Martin time: ____________ Date: ____________ Movements: ____________ Start time: ____________ Doreatha Martin time: ____________ Date: ____________ Movements: ____________ Start time: ____________ Doreatha Martin time: ____________ Date: ____________ Movements: ____________ Start time: ____________ Doreatha Martin time: ____________ Date: ____________ Movements: ____________ Start time: ____________ Doreatha Martin time: ____________  Date: ____________ Movements: ____________ Start time: ____________ Doreatha Martin time: ____________ Date: ____________ Movements: ____________ Start time: ____________ Doreatha Martin time: ____________ Date: ____________ Movements: ____________ Start time: ____________ Doreatha Martin time: ____________ Date: ____________ Movements: ____________ Start time: ____________ Doreatha Martin time: ____________ Date: ____________ Movements: ____________ Start time: ____________ Doreatha Martin time: ____________ Date: ____________ Movements: ____________ Start time: ____________ Doreatha Martin time: ____________ Date: ____________ Movements: ____________ Start time: ____________ Doreatha Martin time: ____________  Date: ____________ Movements: ____________ Start time: ____________ Doreatha Martin time: ____________ Date: ____________ Movements: ____________ Start time: ____________ Doreatha Martin time: ____________ Date: ____________ Movements: ____________ Start time: ____________ Doreatha Martin time: ____________ Date: ____________ Movements: ____________ Start time: ____________ Doreatha Martin time: ____________ Date: ____________ Movements: ____________ Start time: ____________ Doreatha Martin time: ____________ Date: ____________ Movements: ____________ Start time: ____________ Doreatha Martin time: ____________ Date: ____________ Movements: ____________ Start time: ____________ Doreatha Martin time: ____________  Date: ____________ Movements: ____________ Start time: ____________ Doreatha Martin time: ____________ Date: ____________ Movements: ____________ Start time: ____________ Doreatha Martin time:  ____________ Date: ____________ Movements: ____________ Start time: ____________ Doreatha Martin time: ____________ Date: ____________ Movements: ____________ Start time: ____________ Doreatha Martin time: ____________ Date: ____________ Movements: ____________ Start time: ____________ Doreatha Martin time: ____________ Date: ____________ Movements: ____________ Start time: ____________ Doreatha Martin time: ____________ Date: ____________ Movements: ____________ Start time: ____________ Doreatha Martin time: ____________  Date: ____________ Movements: ____________ Start time: ____________ Doreatha Martin  time: ____________ Date: ____________ Movements: ____________ Start time: ____________ Finish time: ____________ Date: ____________ Movements: ____________ Start time: ____________ Finish time: ____________ Date: ____________ Movements: ____________ Start time: ____________ Finish time: ____________ Date: ____________ Movements: ____________ Start time: ____________ Finish time: ____________ Date: ____________ Movements: ____________ Start time: ____________ Finish time: ____________ Date: ____________ Movements: ____________ Start time: ____________ Finish time: ____________  Date: ____________ Movements: ____________ Start time: ____________ Finish time: ____________ Date: ____________ Movements: ____________ Start time: ____________ Finish time: ____________ Date: ____________ Movements: ____________ Start time: ____________ Finish time: ____________ Date: ____________ Movements: ____________ Start time: ____________ Finish time: ____________ Date: ____________ Movements: ____________ Start time: ____________ Finish time: ____________ Date: ____________ Movements: ____________ Start time: ____________ Finish time: ____________ Date: ____________ Movements: ____________ Start time: ____________ Finish time: ____________  Date: ____________ Movements: ____________ Start time: ____________ Finish time: ____________ Date: ____________ Movements:  ____________ Start time: ____________ Finish time: ____________ Date: ____________ Movements: ____________ Start time: ____________ Finish time: ____________ Date: ____________ Movements: ____________ Start time: ____________ Finish time: ____________ Date: ____________ Movements: ____________ Start time: ____________ Finish time: ____________ Date: ____________ Movements: ____________ Start time: ____________ Finish time: ____________ Date: ____________ Movements: ____________ Start time: ____________ Finish time: ____________  Date: ____________ Movements: ____________ Start time: ____________ Finish time: ____________ Date: ____________ Movements: ____________ Start time: ____________ Finish time: ____________ Date: ____________ Movements: ____________ Start time: ____________ Finish time: ____________ Date: ____________ Movements: ____________ Start time: ____________ Finish time: ____________ Date: ____________ Movements: ____________ Start time: ____________ Finish time: ____________ Date: ____________ Movements: ____________ Start time: ____________ Finish time: ____________ Document Released: 12/04/2006 Document Revised: 10/21/2012 Document Reviewed: 08/31/2012 [employer] Patient Information 2014 [employer].  

## 2013-05-20 NOTE — Progress Notes (Signed)
Pt c/o swelling and watery discharge, states went to [facility] to be evaluated x 4 weeks ago for water discharge and told WNL. Pt here for PN2 and ultrasound also today.   Ultrasound for cervix length: 4.1 cm length, closed, no funnelling.  Will schedule for rpt c/s 22 Sept Monday . Considering BTL at c/s. Options if interval BTL at  6mos vs at c/s discussed. jvf

## 2013-05-20 NOTE — Progress Notes (Signed)
U/S(27+4wks)-vtx active fetus, approp growth EFw 2 lb 7 oz 52nd%tile, fluid WNL, ant gr 1 plac, cx long and closed 4.1cm, female fetus

## 2013-05-21 LAB — GLUCOSE TOLERANCE, 2 HOURS W/ 1HR
Glucose, 1 hour: 89 mg/dL (ref 70–170)
Glucose, 2 hour: 71 mg/dL (ref 70–139)
Glucose, Fasting: 69 mg/dL — ABNORMAL LOW (ref 70–99)

## 2013-05-21 LAB — ANTIBODY SCREEN: Antibody Screen: NEGATIVE

## 2013-05-24 LAB — HSV 2 ANTIBODY, IGG: HSV 2 Glycoprotein G Ab, IgG: 0.16 IV

## 2013-06-01 ENCOUNTER — Ambulatory Visit (INDEPENDENT_AMBULATORY_CARE_PROVIDER_SITE_OTHER): Payer: BC Managed Care – PPO | Admitting: Women's Health

## 2013-06-01 ENCOUNTER — Other Ambulatory Visit (INDEPENDENT_AMBULATORY_CARE_PROVIDER_SITE_OTHER): Payer: BC Managed Care – PPO

## 2013-06-01 ENCOUNTER — Encounter: Payer: Self-pay | Admitting: Women's Health

## 2013-06-01 VITALS — BP 116/80 | Wt 202.8 lb

## 2013-06-01 DIAGNOSIS — O99891 Other specified diseases and conditions complicating pregnancy: Secondary | ICD-10-CM

## 2013-06-01 DIAGNOSIS — Z1389 Encounter for screening for other disorder: Secondary | ICD-10-CM

## 2013-06-01 DIAGNOSIS — O10913 Unspecified pre-existing hypertension complicating pregnancy, third trimester: Secondary | ICD-10-CM

## 2013-06-01 DIAGNOSIS — R3915 Urgency of urination: Secondary | ICD-10-CM

## 2013-06-01 DIAGNOSIS — Z331 Pregnant state, incidental: Secondary | ICD-10-CM

## 2013-06-01 DIAGNOSIS — O34219 Maternal care for unspecified type scar from previous cesarean delivery: Secondary | ICD-10-CM

## 2013-06-01 DIAGNOSIS — O10019 Pre-existing essential hypertension complicating pregnancy, unspecified trimester: Secondary | ICD-10-CM

## 2013-06-01 DIAGNOSIS — O0993 Supervision of high risk pregnancy, unspecified, third trimester: Secondary | ICD-10-CM

## 2013-06-01 LAB — POCT URINALYSIS DIPSTICK
Glucose, UA: NEGATIVE
Leukocytes, UA: NEGATIVE
Nitrite, UA: NEGATIVE

## 2013-06-01 NOTE — Patient Instructions (Signed)
Urinary Tract Infection in Pregnancy  A urinary tract infection (UTI) is a bacterial infection of the urinary tract. Infection of the urinary tract can include the ureters, kidneys (pyelonephritis), bladder (cystitis), and urethra (urethritis). All pregnant women should be screened for bacteria in the urinary tract. Identifying and treating a UTI will decrease the risk of preterm labor and developing more serious infections in both the mother and baby.  CAUSES  Bacteria germs cause almost all UTIs. There are many factors that can increase your chances of getting a UTI during pregnancy. These include:   Having a short urethra.   Poor toilet and hygiene habits.   Sexual intercourse.   Blockage of urine along the urinary tract.   Problems with the pelvic muscles or nerves.   Diabetes.   Obesity.   Bladder problems after having several children.   Previous history of UTI.  SYMPTOMS    Pain, burning, or a stinging feeling when urinating.   Suddenly feeling the need to urinate right away (urgency).   Loss of bladder control (urinary incontinence).   Frequent urination, more than is common with pregnancy.   Lower abdominal or back discomfort.   Cloudy urine.   Blood in the urine (hematuria).   Fever.  When the kidneys are infected, the symptoms may be:   Back pain.   Flank pain on the right side more so than the left.   Fever.   Chills.   Nausea.   Vomiting.  DIAGNOSIS    Urine tests.   Additional tests and procedures may include:   Ultrasound of the kidneys, ureters, bladder, and urethra.   Looking in the bladder with a lighted tube (cystoscopy).   Certain X-ray studies only when absolutely necessary.  Finding out the results of your test  Ask when your test results will be ready. Make sure you get your test results.  TREATMENT   Antibiotic medicine by mouth.   Antibiotics given through the vein (intravenously), if needed.  HOME CARE INSTRUCTIONS    Take your antibiotics as directed. Finish  them even if you start to feel better. Only take medicine as directed by your caregiver.   Drink enough fluids to keep your urine clear or pale yellow.   Do not have sexual intercourse until the infection is gone and your caregiver says it is okay.   Make sure you are tested for UTIs throughout your pregnancy if you get one. These infections often come back.  Preventing a UTI in the future:   Practice good toilet habits. Always wipe from front to back. Use the tissue only once.   Do not hold your urine. Empty your bladder as soon as possible when the urge comes.   Do not douche or use deodorant sprays.   Wash with soap and warm water around the genital area and the anus.   Empty your bladder before and after sexual intercourse.   Wear underwear with a cotton crotch.   Avoid caffeine and carbonated drinks. They can irritate the bladder.   Drink cranberry juice or take cranberry pills. This may decrease the risk of getting a UTI.   Do not drink alcohol.   Keep all your appointments and tests as scheduled.  SEEK MEDICAL CARE IF:    Your symptoms get worse.   You are still having fevers 2 or more days after treatment begins.   You develop a rash.   You feel that you are having problems with medicines prescribed.   You   develop abnormal vaginal discharge.  SEEK IMMEDIATE MEDICAL CARE IF:    You develop back or flank pain.   You develop chills.   You have blood in your urine.   You develop nausea and vomiting.   You develop contractions of your uterus.   You have a gush of fluid from the vagina.  MAKE SURE YOU:    Understand these instructions.   Will watch your condition.   Will get help right away if you are not doing well or get worse.  Document Released: 03/01/2011 Document Revised: 10/21/2012 Document Reviewed: 03/01/2011  [employer] Patient Information 2014 [employer].

## 2013-06-01 NOTE — Progress Notes (Signed)
EFW= 2#,15oz., meas. C/W dates, 48%tile, AFI=15.2cm/55% for 29 wks, cx  Not seen, , FHR @ 134 bpm, vertex lie, active, ant. plac.

## 2013-06-01 NOTE — Progress Notes (Signed)
Has noticed blood in urine off and on.

## 2013-06-01 NOTE — Addendum Note (Signed)
Addended by: Cheral Marker on: 06/01/2013 03:14 PM   Modules accepted: Orders

## 2013-06-01 NOTE — Progress Notes (Signed)
W[place] today for reports of seeing spots of blood in urine. Urine today neg for all including hemoglobin. Some increased frequency and urgency, no dysuria or hesitancy. Will send urine for culture. Reports good fm. Reviewed ptl s/s, fetal kick counts, s/s to report.  All questions answered. F/U asap for growth u/s, and 3wks for visit and begin 2x/wk nst's d/t [facility].

## 2013-06-03 LAB — URINE CULTURE
Colony Count: NO GROWTH
Organism ID, Bacteria: NO GROWTH

## 2013-06-05 ENCOUNTER — Encounter: Payer: Self-pay | Admitting: Women's Health

## 2013-06-10 ENCOUNTER — Other Ambulatory Visit: Payer: Self-pay | Admitting: Obstetrics & Gynecology

## 2013-06-10 DIAGNOSIS — O10013 Pre-existing essential hypertension complicating pregnancy, third trimester: Secondary | ICD-10-CM

## 2013-06-17 ENCOUNTER — Ambulatory Visit (INDEPENDENT_AMBULATORY_CARE_PROVIDER_SITE_OTHER): Payer: BC Managed Care – PPO

## 2013-06-17 ENCOUNTER — Other Ambulatory Visit: Payer: Self-pay | Admitting: Obstetrics & Gynecology

## 2013-06-17 ENCOUNTER — Encounter: Payer: BC Managed Care – PPO | Admitting: Advanced Practice Midwife

## 2013-06-17 ENCOUNTER — Ambulatory Visit (INDEPENDENT_AMBULATORY_CARE_PROVIDER_SITE_OTHER): Payer: BC Managed Care – PPO | Admitting: Advanced Practice Midwife

## 2013-06-17 ENCOUNTER — Encounter: Payer: Self-pay | Admitting: Advanced Practice Midwife

## 2013-06-17 VITALS — BP 90/60 | Wt 205.0 lb

## 2013-06-17 DIAGNOSIS — O10913 Unspecified pre-existing hypertension complicating pregnancy, third trimester: Secondary | ICD-10-CM

## 2013-06-17 DIAGNOSIS — O10019 Pre-existing essential hypertension complicating pregnancy, unspecified trimester: Secondary | ICD-10-CM

## 2013-06-17 DIAGNOSIS — O10013 Pre-existing essential hypertension complicating pregnancy, third trimester: Secondary | ICD-10-CM

## 2013-06-17 DIAGNOSIS — O99891 Other specified diseases and conditions complicating pregnancy: Secondary | ICD-10-CM

## 2013-06-17 DIAGNOSIS — Z1389 Encounter for screening for other disorder: Secondary | ICD-10-CM

## 2013-06-17 DIAGNOSIS — O34219 Maternal care for unspecified type scar from previous cesarean delivery: Secondary | ICD-10-CM

## 2013-06-17 LAB — POCT URINALYSIS DIPSTICK
Blood, UA: NEGATIVE
Leukocytes, UA: NEGATIVE
Nitrite, UA: NEGATIVE
Protein, UA: NEGATIVE

## 2013-06-17 NOTE — Progress Notes (Signed)
HAD U/S DONE TODAY. 

## 2013-06-17 NOTE — Patient Instructions (Addendum)
Take only 1 methyldopa in the am and 1 in the pm for now.

## 2013-06-17 NOTE — Progress Notes (Addendum)
Had u/s today.  Was lying on her back during u/s and felt dizzy.  Probably has low b/p due to positioning.  On aldoment 500mg  BID.  Pt to take b/p at home at if it stays low, will  Decrease medication. No c/o at this time.  Routine questions about pregnancy answered.  F/U in Monday to start NST alternating with BPP q week.  Requested maternity leave now,.  States employer is fine with it, and will not draw disability.

## 2013-06-17 NOTE — Progress Notes (Signed)
U/S(31+4wks)-active fetus, meas c/w dates EFw 3 lb 8 oz (32nd%tile), fluid wnl AFI-11.3cm, fundal Gr1 plac, BPP 8/8, UA Doppler RI-0.63 & 0.61

## 2013-06-21 ENCOUNTER — Ambulatory Visit (INDEPENDENT_AMBULATORY_CARE_PROVIDER_SITE_OTHER): Payer: BC Managed Care – PPO | Admitting: Obstetrics and Gynecology

## 2013-06-21 VITALS — BP 100/64 | Wt 207.6 lb

## 2013-06-21 DIAGNOSIS — Z1389 Encounter for screening for other disorder: Secondary | ICD-10-CM

## 2013-06-21 DIAGNOSIS — O99891 Other specified diseases and conditions complicating pregnancy: Secondary | ICD-10-CM

## 2013-06-21 DIAGNOSIS — Z331 Pregnant state, incidental: Secondary | ICD-10-CM

## 2013-06-21 DIAGNOSIS — O10019 Pre-existing essential hypertension complicating pregnancy, unspecified trimester: Secondary | ICD-10-CM

## 2013-06-21 DIAGNOSIS — O34219 Maternal care for unspecified type scar from previous cesarean delivery: Secondary | ICD-10-CM

## 2013-06-21 LAB — POCT URINALYSIS DIPSTICK
Blood, UA: NEGATIVE
Glucose, UA: NEGATIVE
Nitrite, UA: NEGATIVE

## 2013-06-21 NOTE — Progress Notes (Signed)
No complaints at this time. NST today. 

## 2013-06-24 ENCOUNTER — Encounter: Payer: Self-pay | Admitting: Obstetrics & Gynecology

## 2013-06-24 ENCOUNTER — Other Ambulatory Visit: Payer: Self-pay | Admitting: Obstetrics & Gynecology

## 2013-06-24 ENCOUNTER — Ambulatory Visit (INDEPENDENT_AMBULATORY_CARE_PROVIDER_SITE_OTHER): Payer: BC Managed Care – PPO | Admitting: Obstetrics & Gynecology

## 2013-06-24 ENCOUNTER — Inpatient Hospital Stay (HOSPITAL_COMMUNITY)
Admission: AD | Admit: 2013-06-24 | Discharge: 2013-06-24 | Disposition: A | Discharge status: Home / Self Care | Payer: BC Managed Care – PPO | Source: Ambulatory Visit | Attending: Family Medicine | Admitting: Family Medicine

## 2013-06-24 ENCOUNTER — Other Ambulatory Visit: Payer: Self-pay | Admitting: Advanced Practice Midwife

## 2013-06-24 ENCOUNTER — Ambulatory Visit (INDEPENDENT_AMBULATORY_CARE_PROVIDER_SITE_OTHER): Payer: BC Managed Care – PPO

## 2013-06-24 VITALS — BP 90/60 | Wt 210.0 lb

## 2013-06-24 DIAGNOSIS — O10019 Pre-existing essential hypertension complicating pregnancy, unspecified trimester: Secondary | ICD-10-CM

## 2013-06-24 DIAGNOSIS — Z1389 Encounter for screening for other disorder: Secondary | ICD-10-CM

## 2013-06-24 DIAGNOSIS — Z331 Pregnant state, incidental: Secondary | ICD-10-CM

## 2013-06-24 DIAGNOSIS — O10913 Unspecified pre-existing hypertension complicating pregnancy, third trimester: Secondary | ICD-10-CM

## 2013-06-24 DIAGNOSIS — O99891 Other specified diseases and conditions complicating pregnancy: Secondary | ICD-10-CM | POA: Insufficient documentation

## 2013-06-24 DIAGNOSIS — O10013 Pre-existing essential hypertension complicating pregnancy, third trimester: Secondary | ICD-10-CM

## 2013-06-24 DIAGNOSIS — O34219 Maternal care for unspecified type scar from previous cesarean delivery: Secondary | ICD-10-CM

## 2013-06-24 DIAGNOSIS — Z0379 Encounter for other suspected maternal and fetal conditions ruled out: Secondary | ICD-10-CM

## 2013-06-24 LAB — POCT URINALYSIS DIPSTICK
Blood, UA: NEGATIVE
Glucose, UA: NEGATIVE
Ketones, UA: NEGATIVE
Leukocytes, UA: NEGATIVE
Nitrite, UA: NEGATIVE
Protein, UA: NEGATIVE

## 2013-06-24 NOTE — MAU Note (Signed)
Pt states she felt some waterl eaking about 1715. Pt  States she was having contractions earlier but only occasionally now

## 2013-06-24 NOTE — MAU Provider Note (Cosign Needed)
  History     CSN: [id number]  Arrival date and time: 06/24/13 1831   None     Chief Complaint  Patient presents with  . poss rom    HPI Sarah Eaton is a 27 y.o. G2P1001 at [redacted]w[redacted]d is for evaluation leakage of fluid. Patient reports 2 episodes of three-inch diameter spots of liquid after sitting. Patient has had issues with leakage of urine as well. Patient states baby is moving well. No contractions, no vaginal bleeding.  Patient is otherwise in her normal state of health. OB History   Grav Para Term Preterm Abortions TAB SAB Ect Mult Living   2 1 1       1       Past Medical History  Diagnosis Date  . Hypertension     Past Surgical History  Procedure Laterality Date  . Cesarean section      Family History  Problem Relation Age of Onset  . Hypertension Mother   . Cancer Mother     skin  . Cancer Maternal Grandmother   . Hypertension Maternal Grandmother   . Cancer Other     breast    History  Substance Use Topics  . Smoking status: Never Smoker   . Smokeless tobacco: Not on file  . Alcohol Use: No    Allergies: No Known Allergies  No prescriptions prior to admission    Review of Systems  Constitutional: Negative for fever and chills.  HENT: Negative for hearing loss.   Respiratory: Negative for cough and hemoptysis.   Gastrointestinal: Negative for nausea, vomiting and abdominal pain.  Neurological: Negative for headaches.   Physical Exam   Blood pressure 122/78, pulse 92, temperature 98 F (36.7 C), temperature source Oral, resp. rate 18, weight 95.709 kg (211 lb), last menstrual period 11/08/2012.  Physical Exam  Nursing note and vitals reviewed. Constitutional: She is oriented to person, place, and time. She appears well-developed and well-nourished. No distress.  Eyes: Conjunctivae and EOM are normal.  Neck: Normal range of motion. Neck supple.  Respiratory: Effort normal.  GI: Soft. She exhibits no distension and no mass. There is no  tenderness. There is no rebound and no guarding.  Genitourinary: Vagina normal and uterus normal. No vaginal discharge ( normal lochia pregnancy) found.  Neurological: She is alert and oriented to person, place, and time. No cranial nerve deficit. Coordination normal.  Skin: She is not diaphoretic.  Psychiatric: She has a normal mood and affect. Her behavior is normal. Judgment and thought content normal.   SVE: Fingertip/thick/high   Negative ferning MAU Course  Procedures  MDM Patient evaluated for rupture of membranes. Patient had normal lochia of pregnancy on visual exam. Patient had negative ferning on slide. Patient reassured and discharged home.   Assessment and Plan  Sarah Eaton is a 27 y.o. G2P1001 at [redacted]w[redacted]d here for evaluation from. Patient had negative ferning on slide. Patient had reassuring fetal heart tracings. Likely urine. Patient discharged with preterm labor precaution   Teddrick Mallari, Audie Clear 06/24/2013, 10:45 PM

## 2013-06-24 NOTE — Progress Notes (Signed)
U/S(32+4wks)-vtx active fetus, BPP 8/8, flud wnl AFI=13cm, UA Doppler RI 0.55, ant gr 1 plac

## 2013-06-24 NOTE — Progress Notes (Signed)
FOLLOW-UP U/S. 

## 2013-06-24 NOTE — Progress Notes (Signed)
Sonogram reviewed see report   BPP 8/8 excellent Doppler ratio BP weight and urine results all reviewed and noted. Patient reports good fetal movement, denies any bleeding and no rupture of membranes symptoms or regular contractions. Patient is without complaints. All questions were answered. Continue twice weekly testing

## 2013-06-24 NOTE — MAU Note (Signed)
Was at dr's office today- everything was fine.  Had a bad pain in upper abd., afterward, got up to bathroom and there was a wet spot on the bed, and another again later.  Underwear is damp- but that is nothing new

## 2013-06-28 ENCOUNTER — Ambulatory Visit (INDEPENDENT_AMBULATORY_CARE_PROVIDER_SITE_OTHER): Payer: BC Managed Care – PPO | Admitting: Obstetrics and Gynecology

## 2013-06-28 ENCOUNTER — Other Ambulatory Visit: Payer: BC Managed Care – PPO

## 2013-06-28 ENCOUNTER — Encounter: Payer: Self-pay | Admitting: Obstetrics and Gynecology

## 2013-06-28 VITALS — BP 120/90 | Wt 209.2 lb

## 2013-06-28 DIAGNOSIS — Z331 Pregnant state, incidental: Secondary | ICD-10-CM

## 2013-06-28 DIAGNOSIS — Z1389 Encounter for screening for other disorder: Secondary | ICD-10-CM

## 2013-06-28 DIAGNOSIS — O0993 Supervision of high risk pregnancy, unspecified, third trimester: Secondary | ICD-10-CM

## 2013-06-28 DIAGNOSIS — O34219 Maternal care for unspecified type scar from previous cesarean delivery: Secondary | ICD-10-CM

## 2013-06-28 DIAGNOSIS — O10019 Pre-existing essential hypertension complicating pregnancy, unspecified trimester: Secondary | ICD-10-CM

## 2013-06-28 LAB — POCT URINALYSIS DIPSTICK
Blood, UA: NEGATIVE
Glucose, UA: NEGATIVE
Nitrite, UA: NEGATIVE

## 2013-06-28 NOTE — Patient Instructions (Signed)
Kick countsFetal Movement Counts Patient Name: __________________________________________________ Patient Due Date: ____________________ Performing a fetal movement count is highly recommended in high-risk pregnancies, but it is good for every pregnant woman to do. Your caregiver may ask you to start counting fetal movements at 28 weeks of the pregnancy. Fetal movements often increase:  After eating a full meal.  After physical activity.  After eating or drinking something sweet or cold.  At rest. Pay attention to when you feel the baby is most active. This will help you notice a pattern of your baby's sleep and wake cycles and what factors contribute to an increase in fetal movement. It is important to perform a fetal movement count at the same time each day when your baby is normally most active.  HOW TO COUNT FETAL MOVEMENTS 1. Find a quiet and comfortable area to sit or lie down on your left side. Lying on your left side provides the best blood and oxygen circulation to your baby. 2. Write down the day and time on a sheet of paper or in a journal. 3. Start counting kicks, flutters, swishes, rolls, or jabs in a 2 hour period. You should feel at least 10 movements within 2 hours. 4. If you do not feel 10 movements in 2 hours, wait 2 3 hours and count again. Look for a change in the pattern or not enough counts in 2 hours. SEEK MEDICAL CARE IF:  You feel less than 10 counts in 2 hours, tried twice.  There is no movement in over an hour.  The pattern is changing or taking longer each day to reach 10 counts in 2 hours.  You feel the baby is not moving as he or she usually does. Date: ____________ Movements: ____________ Start time: ____________ Finish time: ____________  Date: ____________ Movements: ____________ Start time: ____________ Finish time: ____________ Date: ____________ Movements: ____________ Start time: ____________ Finish time: ____________ Date: ____________ Movements:  ____________ Start time: ____________ Finish time: ____________ Date: ____________ Movements: ____________ Start time: ____________ Finish time: ____________ Date: ____________ Movements: ____________ Start time: ____________ Finish time: ____________ Date: ____________ Movements: ____________ Start time: ____________ Finish time: ____________ Date: ____________ Movements: ____________ Start time: ____________ Finish time: ____________  Date: ____________ Movements: ____________ Start time: ____________ Finish time: ____________ Date: ____________ Movements: ____________ Start time: ____________ Finish time: ____________ Date: ____________ Movements: ____________ Start time: ____________ Finish time: ____________ Date: ____________ Movements: ____________ Start time: ____________ Finish time: ____________ Date: ____________ Movements: ____________ Start time: ____________ Finish time: ____________ Date: ____________ Movements: ____________ Start time: ____________ Finish time: ____________ Date: ____________ Movements: ____________ Start time: ____________ Finish time: ____________  Date: ____________ Movements: ____________ Start time: ____________ Finish time: ____________ Date: ____________ Movements: ____________ Start time: ____________ Finish time: ____________ Date: ____________ Movements: ____________ Start time: ____________ Finish time: ____________ Date: ____________ Movements: ____________ Start time: ____________ Finish time: ____________ Date: ____________ Movements: ____________ Start time: ____________ Finish time: ____________ Date: ____________ Movements: ____________ Start time: ____________ Finish time: ____________ Date: ____________ Movements: ____________ Start time: ____________ Finish time: ____________  Date: ____________ Movements: ____________ Start time: ____________ Finish time: ____________ Date: ____________ Movements: ____________ Start time: ____________ Finish  time: ____________ Date: ____________ Movements: ____________ Start time: ____________ Finish time: ____________ Date: ____________ Movements: ____________ Start time: ____________ Finish time: ____________ Date: ____________ Movements: ____________ Start time: ____________ Finish time: ____________ Date: ____________ Movements: ____________ Start time: ____________ Finish time: ____________ Date: ____________ Movements: ____________ Start time: ____________ Finish time: ____________  Date: ____________ Movements: ____________ Start time: ____________   Finish time: ____________ Date: ____________ Movements: ____________ Start time: ____________ Finish time: ____________ Date: ____________ Movements: ____________ Start time: ____________ Finish time: ____________ Date: ____________ Movements: ____________ Start time: ____________ Finish time: ____________ Date: ____________ Movements: ____________ Start time: ____________ Finish time: ____________ Date: ____________ Movements: ____________ Start time: ____________ Finish time: ____________ Date: ____________ Movements: ____________ Start time: ____________ Finish time: ____________  Date: ____________ Movements: ____________ Start time: ____________ Finish time: ____________ Date: ____________ Movements: ____________ Start time: ____________ Finish time: ____________ Date: ____________ Movements: ____________ Start time: ____________ Finish time: ____________ Date: ____________ Movements: ____________ Start time: ____________ Finish time: ____________ Date: ____________ Movements: ____________ Start time: ____________ Finish time: ____________ Date: ____________ Movements: ____________ Start time: ____________ Finish time: ____________ Date: ____________ Movements: ____________ Start time: ____________ Finish time: ____________  Date: ____________ Movements: ____________ Start time: ____________ Finish time: ____________ Date: ____________  Movements: ____________ Start time: ____________ Finish time: ____________ Date: ____________ Movements: ____________ Start time: ____________ Finish time: ____________ Date: ____________ Movements: ____________ Start time: ____________ Finish time: ____________ Date: ____________ Movements: ____________ Start time: ____________ Finish time: ____________ Date: ____________ Movements: ____________ Start time: ____________ Finish time: ____________ Date: ____________ Movements: ____________ Start time: ____________ Finish time: ____________  Date: ____________ Movements: ____________ Start time: ____________ Finish time: ____________ Date: ____________ Movements: ____________ Start time: ____________ Finish time: ____________ Date: ____________ Movements: ____________ Start time: ____________ Finish time: ____________ Date: ____________ Movements: ____________ Start time: ____________ Finish time: ____________ Date: ____________ Movements: ____________ Start time: ____________ Finish time: ____________ Date: ____________ Movements: ____________ Start time: ____________ Finish time: ____________ Document Released: 12/04/2006 Document Revised: 10/21/2012 Document Reviewed: 08/31/2012 [employer] Patient Information 2014 [employer].  

## 2013-06-28 NOTE — Progress Notes (Signed)
NST today for chtn.  Developed first htn with first pregnancy GHTN, none during interval. NST reactive

## 2013-06-29 ENCOUNTER — Telehealth: Payer: Self-pay | Admitting: Obstetrics and Gynecology

## 2013-06-29 NOTE — Telephone Encounter (Signed)
Pt states checked blood pressure last night due to night feeling well and was 90/63. Pt states taking Aldomet 500 mg  Bid. Pt was seen in office yesterday and blood pressure was 120/90 and has f/u appt on this Thursday. Per Dr. Despina Hidden pt to continue taking Aldomet as prescribed and to keep her appt for Thursday. Pt verbalized understanding.

## 2013-07-01 ENCOUNTER — Other Ambulatory Visit: Payer: Self-pay | Admitting: Obstetrics & Gynecology

## 2013-07-01 ENCOUNTER — Ambulatory Visit (INDEPENDENT_AMBULATORY_CARE_PROVIDER_SITE_OTHER): Payer: BC Managed Care – PPO | Admitting: Advanced Practice Midwife

## 2013-07-01 ENCOUNTER — Ambulatory Visit (INDEPENDENT_AMBULATORY_CARE_PROVIDER_SITE_OTHER): Payer: BC Managed Care – PPO

## 2013-07-01 VITALS — BP 120/80 | Wt 212.0 lb

## 2013-07-01 DIAGNOSIS — O10913 Unspecified pre-existing hypertension complicating pregnancy, third trimester: Secondary | ICD-10-CM

## 2013-07-01 DIAGNOSIS — Z1389 Encounter for screening for other disorder: Secondary | ICD-10-CM

## 2013-07-01 DIAGNOSIS — O10013 Pre-existing essential hypertension complicating pregnancy, third trimester: Secondary | ICD-10-CM

## 2013-07-01 DIAGNOSIS — O10019 Pre-existing essential hypertension complicating pregnancy, unspecified trimester: Secondary | ICD-10-CM

## 2013-07-01 LAB — POCT URINALYSIS DIPSTICK
Blood, UA: NEGATIVE
Protein, UA: NEGATIVE

## 2013-07-01 NOTE — Progress Notes (Signed)
Had U/S today.  All normal.  Denies problems.  F/U Monday for NST.

## 2013-07-01 NOTE — Progress Notes (Signed)
U/S(33+4wks)-vtx active fetus, meas c/w dates EFW 4 lb 10 oz (36th%tile), fluid wnl AFI-8.4cm, fundal gr 1 plac, BPP 8/8, UA Doppler RI-0.67 & 0.69, female fetus

## 2013-07-01 NOTE — Progress Notes (Signed)
Pt here today for routine visit and [place]. Pt states she is having pain and pressure and some irregular contractions. Pt denies any other problems or concerns.

## 2013-07-05 ENCOUNTER — Ambulatory Visit (INDEPENDENT_AMBULATORY_CARE_PROVIDER_SITE_OTHER): Payer: BC Managed Care – PPO | Admitting: Obstetrics and Gynecology

## 2013-07-05 VITALS — BP 128/88 | Wt 213.0 lb

## 2013-07-05 DIAGNOSIS — O10019 Pre-existing essential hypertension complicating pregnancy, unspecified trimester: Secondary | ICD-10-CM

## 2013-07-05 DIAGNOSIS — Z1389 Encounter for screening for other disorder: Secondary | ICD-10-CM

## 2013-07-05 DIAGNOSIS — Z331 Pregnant state, incidental: Secondary | ICD-10-CM

## 2013-07-05 LAB — POCT URINALYSIS DIPSTICK: Ketones, UA: NEGATIVE

## 2013-07-05 NOTE — Progress Notes (Signed)
No complaints at this time. Unable to obtain urine sample.

## 2013-07-06 ENCOUNTER — Other Ambulatory Visit: Payer: Self-pay | Admitting: Obstetrics & Gynecology

## 2013-07-06 DIAGNOSIS — O10913 Unspecified pre-existing hypertension complicating pregnancy, third trimester: Secondary | ICD-10-CM

## 2013-07-07 ENCOUNTER — Other Ambulatory Visit: Payer: Self-pay | Admitting: Obstetrics & Gynecology

## 2013-07-08 ENCOUNTER — Encounter: Payer: Self-pay | Admitting: Advanced Practice Midwife

## 2013-07-08 ENCOUNTER — Ambulatory Visit (INDEPENDENT_AMBULATORY_CARE_PROVIDER_SITE_OTHER): Payer: BC Managed Care – PPO | Admitting: Advanced Practice Midwife

## 2013-07-08 ENCOUNTER — Ambulatory Visit (INDEPENDENT_AMBULATORY_CARE_PROVIDER_SITE_OTHER): Payer: BC Managed Care – PPO

## 2013-07-08 VITALS — BP 116/72 | Wt 213.8 lb

## 2013-07-08 DIAGNOSIS — Z331 Pregnant state, incidental: Secondary | ICD-10-CM

## 2013-07-08 DIAGNOSIS — Z1389 Encounter for screening for other disorder: Secondary | ICD-10-CM

## 2013-07-08 DIAGNOSIS — O10019 Pre-existing essential hypertension complicating pregnancy, unspecified trimester: Secondary | ICD-10-CM

## 2013-07-08 DIAGNOSIS — O10913 Unspecified pre-existing hypertension complicating pregnancy, third trimester: Secondary | ICD-10-CM

## 2013-07-08 LAB — POCT URINALYSIS DIPSTICK
Blood, UA: NEGATIVE
Glucose, UA: NEGATIVE
Ketones, UA: NEGATIVE

## 2013-07-08 NOTE — Progress Notes (Addendum)
U/S(34+4wks)-vtx active fetus, BPP 8/8, fluid WNL AFI-8.5cm, ant gr 0 plac, UA Doppler RI=0.64 & 0.62, female fetus

## 2013-07-08 NOTE — Progress Notes (Signed)
Stuffy head/headache today. OTC meds for now.  U/s normal.  F/U Monday for NST

## 2013-07-09 ENCOUNTER — Other Ambulatory Visit: Payer: Self-pay | Admitting: Obstetrics & Gynecology

## 2013-07-09 DIAGNOSIS — O10013 Pre-existing essential hypertension complicating pregnancy, third trimester: Secondary | ICD-10-CM

## 2013-07-12 ENCOUNTER — Ambulatory Visit (INDEPENDENT_AMBULATORY_CARE_PROVIDER_SITE_OTHER): Payer: BC Managed Care – PPO | Admitting: Women's Health

## 2013-07-12 VITALS — BP 110/80 | Wt 218.0 lb

## 2013-07-12 DIAGNOSIS — O10019 Pre-existing essential hypertension complicating pregnancy, unspecified trimester: Secondary | ICD-10-CM

## 2013-07-12 DIAGNOSIS — O0993 Supervision of high risk pregnancy, unspecified, third trimester: Secondary | ICD-10-CM

## 2013-07-12 DIAGNOSIS — O10913 Unspecified pre-existing hypertension complicating pregnancy, third trimester: Secondary | ICD-10-CM

## 2013-07-12 DIAGNOSIS — Z1389 Encounter for screening for other disorder: Secondary | ICD-10-CM

## 2013-07-12 NOTE — Progress Notes (Signed)
Pt here today for routine visit and NST. Pt denies any problems or concerns at this time.  

## 2013-07-12 NOTE — Patient Instructions (Signed)

## 2013-07-12 NOTE — Progress Notes (Signed)
Reports good fm. Denies uc's, lof, vb, urinary frequency, urgency, hesitancy, or dysuria.  No complaints.  NST reactive. Reviewed ptl s/s, fkc.  All questions answered. F/U in 3d for u/s and visit.

## 2013-07-15 ENCOUNTER — Ambulatory Visit (INDEPENDENT_AMBULATORY_CARE_PROVIDER_SITE_OTHER): Payer: BC Managed Care – PPO | Admitting: Obstetrics & Gynecology

## 2013-07-15 ENCOUNTER — Encounter: Payer: Self-pay | Admitting: Obstetrics & Gynecology

## 2013-07-15 ENCOUNTER — Ambulatory Visit (INDEPENDENT_AMBULATORY_CARE_PROVIDER_SITE_OTHER): Payer: BC Managed Care – PPO

## 2013-07-15 VITALS — BP 100/60 | Wt 217.0 lb

## 2013-07-15 DIAGNOSIS — O10019 Pre-existing essential hypertension complicating pregnancy, unspecified trimester: Secondary | ICD-10-CM

## 2013-07-15 DIAGNOSIS — O34219 Maternal care for unspecified type scar from previous cesarean delivery: Secondary | ICD-10-CM

## 2013-07-15 DIAGNOSIS — Z331 Pregnant state, incidental: Secondary | ICD-10-CM

## 2013-07-15 DIAGNOSIS — Z1389 Encounter for screening for other disorder: Secondary | ICD-10-CM

## 2013-07-15 DIAGNOSIS — O10013 Pre-existing essential hypertension complicating pregnancy, third trimester: Secondary | ICD-10-CM

## 2013-07-15 DIAGNOSIS — O9989 Other specified diseases and conditions complicating pregnancy, childbirth and the puerperium: Secondary | ICD-10-CM

## 2013-07-15 DIAGNOSIS — O99891 Other specified diseases and conditions complicating pregnancy: Secondary | ICD-10-CM

## 2013-07-15 LAB — POCT URINALYSIS DIPSTICK
Blood, UA: NEGATIVE
Glucose, UA: NEGATIVE
Nitrite, UA: NEGATIVE

## 2013-07-15 NOTE — Progress Notes (Signed)
Follow-Up U/S. 

## 2013-07-15 NOTE — Progress Notes (Signed)
U/S(35+4wks)-vtx active fetus, EFW 5 lb 2 oz (18th%tile), fluid WNL AFI=11.7cm, ant gr 1 plac, UA Doppler RI=0.67x2, BPP 8/8, FHR=134bpm, female fetus

## 2013-07-15 NOTE — Progress Notes (Signed)
Sonogram noted and report done.

## 2013-07-20 ENCOUNTER — Encounter: Payer: Self-pay | Admitting: Obstetrics & Gynecology

## 2013-07-20 ENCOUNTER — Ambulatory Visit (INDEPENDENT_AMBULATORY_CARE_PROVIDER_SITE_OTHER): Payer: Self-pay | Admitting: Obstetrics & Gynecology

## 2013-07-20 VITALS — BP 110/80 | Wt 221.0 lb

## 2013-07-20 DIAGNOSIS — Z1389 Encounter for screening for other disorder: Secondary | ICD-10-CM

## 2013-07-20 DIAGNOSIS — O99891 Other specified diseases and conditions complicating pregnancy: Secondary | ICD-10-CM

## 2013-07-20 DIAGNOSIS — O99019 Anemia complicating pregnancy, unspecified trimester: Secondary | ICD-10-CM

## 2013-07-20 DIAGNOSIS — Z331 Pregnant state, incidental: Secondary | ICD-10-CM

## 2013-07-20 DIAGNOSIS — O10013 Pre-existing essential hypertension complicating pregnancy, third trimester: Secondary | ICD-10-CM

## 2013-07-20 DIAGNOSIS — O34219 Maternal care for unspecified type scar from previous cesarean delivery: Secondary | ICD-10-CM

## 2013-07-20 DIAGNOSIS — O10019 Pre-existing essential hypertension complicating pregnancy, unspecified trimester: Secondary | ICD-10-CM

## 2013-07-20 LAB — POCT URINALYSIS DIPSTICK
Glucose, UA: NEGATIVE
Leukocytes, UA: NEGATIVE
Protein, UA: NEGATIVE

## 2013-07-20 NOTE — Progress Notes (Signed)
For NST Today. 

## 2013-07-20 NOTE — Progress Notes (Signed)
Reactive NST BP weight and urine results all reviewed and noted. Patient reports good fetal movement, denies any bleeding and no rupture of membranes symptoms or regular contractions. Patient is without complaints. All questions were answered.  

## 2013-07-22 ENCOUNTER — Other Ambulatory Visit: Payer: Self-pay | Admitting: Obstetrics & Gynecology

## 2013-07-22 ENCOUNTER — Ambulatory Visit (INDEPENDENT_AMBULATORY_CARE_PROVIDER_SITE_OTHER): Payer: Self-pay

## 2013-07-22 ENCOUNTER — Ambulatory Visit (INDEPENDENT_AMBULATORY_CARE_PROVIDER_SITE_OTHER): Payer: Self-pay | Admitting: Obstetrics & Gynecology

## 2013-07-22 ENCOUNTER — Encounter: Payer: Self-pay | Admitting: Obstetrics & Gynecology

## 2013-07-22 VITALS — BP 100/70 | Wt 223.0 lb

## 2013-07-22 DIAGNOSIS — O99019 Anemia complicating pregnancy, unspecified trimester: Secondary | ICD-10-CM

## 2013-07-22 DIAGNOSIS — O10013 Pre-existing essential hypertension complicating pregnancy, third trimester: Secondary | ICD-10-CM

## 2013-07-22 DIAGNOSIS — O99891 Other specified diseases and conditions complicating pregnancy: Secondary | ICD-10-CM

## 2013-07-22 DIAGNOSIS — O10019 Pre-existing essential hypertension complicating pregnancy, unspecified trimester: Secondary | ICD-10-CM

## 2013-07-22 DIAGNOSIS — O0993 Supervision of high risk pregnancy, unspecified, third trimester: Secondary | ICD-10-CM

## 2013-07-22 DIAGNOSIS — Z331 Pregnant state, incidental: Secondary | ICD-10-CM

## 2013-07-22 DIAGNOSIS — Z1389 Encounter for screening for other disorder: Secondary | ICD-10-CM

## 2013-07-22 DIAGNOSIS — O34219 Maternal care for unspecified type scar from previous cesarean delivery: Secondary | ICD-10-CM

## 2013-07-22 DIAGNOSIS — Z3480 Encounter for supervision of other normal pregnancy, unspecified trimester: Secondary | ICD-10-CM

## 2013-07-22 LAB — POCT URINALYSIS DIPSTICK
Ketones, UA: NEGATIVE
Leukocytes, UA: NEGATIVE
Nitrite, UA: NEGATIVE
Protein, UA: NEGATIVE

## 2013-07-22 NOTE — Progress Notes (Signed)
For GC/ CHL.

## 2013-07-22 NOTE — Progress Notes (Signed)
U/S(36+4wks)-vtx active fetus, BPP 8/8, fluid wnl  AFI=10.7cm, UA Doppler RI=0.57 & 0.53, female fetus, ant gr 2 plac

## 2013-07-22 NOTE — Progress Notes (Signed)
Sonogram reassuring see report BP weight and urine results all reviewed and noted. Patient reports good fetal movement, denies any bleeding and no rupture of membranes symptoms or regular contractions. Patient is without complaints. All questions were answered.

## 2013-07-23 LAB — GC/CHLAMYDIA PROBE AMP: CT Probe RNA: NEGATIVE

## 2013-07-26 ENCOUNTER — Ambulatory Visit (INDEPENDENT_AMBULATORY_CARE_PROVIDER_SITE_OTHER): Payer: Self-pay | Admitting: Obstetrics & Gynecology

## 2013-07-26 ENCOUNTER — Encounter: Payer: Self-pay | Admitting: Obstetrics & Gynecology

## 2013-07-26 VITALS — BP 124/78 | Wt 222.0 lb

## 2013-07-26 DIAGNOSIS — O99019 Anemia complicating pregnancy, unspecified trimester: Secondary | ICD-10-CM

## 2013-07-26 DIAGNOSIS — O34219 Maternal care for unspecified type scar from previous cesarean delivery: Secondary | ICD-10-CM

## 2013-07-26 DIAGNOSIS — Z1389 Encounter for screening for other disorder: Secondary | ICD-10-CM

## 2013-07-26 DIAGNOSIS — O10913 Unspecified pre-existing hypertension complicating pregnancy, third trimester: Secondary | ICD-10-CM

## 2013-07-26 DIAGNOSIS — O10019 Pre-existing essential hypertension complicating pregnancy, unspecified trimester: Secondary | ICD-10-CM

## 2013-07-26 DIAGNOSIS — O99891 Other specified diseases and conditions complicating pregnancy: Secondary | ICD-10-CM

## 2013-07-26 LAB — POCT URINALYSIS DIPSTICK
Blood, UA: NEGATIVE
Ketones, UA: NEGATIVE
Protein, UA: NEGATIVE

## 2013-07-26 NOTE — Progress Notes (Signed)
Reactive NST BP weight and urine results all reviewed and noted. Patient reports good fetal movement, denies any bleeding and no rupture of membranes symptoms or regular contractions. Patient is without complaints. All questions were answered.  

## 2013-07-26 NOTE — Progress Notes (Signed)
Pt here today for routine visit and NST. Pt states her contractions are getting more regular. Pt states she is still having pressure.

## 2013-07-27 ENCOUNTER — Encounter: Payer: BC Managed Care – PPO | Admitting: Obstetrics & Gynecology

## 2013-07-29 ENCOUNTER — Encounter: Payer: BC Managed Care – PPO | Admitting: Obstetrics & Gynecology

## 2013-07-29 ENCOUNTER — Ambulatory Visit (INDEPENDENT_AMBULATORY_CARE_PROVIDER_SITE_OTHER): Payer: Self-pay | Admitting: Obstetrics & Gynecology

## 2013-07-29 VITALS — BP 108/80 | Wt 223.0 lb

## 2013-07-29 DIAGNOSIS — Z1389 Encounter for screening for other disorder: Secondary | ICD-10-CM

## 2013-07-29 DIAGNOSIS — O10913 Unspecified pre-existing hypertension complicating pregnancy, third trimester: Secondary | ICD-10-CM

## 2013-07-29 DIAGNOSIS — Z331 Pregnant state, incidental: Secondary | ICD-10-CM

## 2013-07-29 DIAGNOSIS — O99891 Other specified diseases and conditions complicating pregnancy: Secondary | ICD-10-CM

## 2013-07-29 DIAGNOSIS — O34219 Maternal care for unspecified type scar from previous cesarean delivery: Secondary | ICD-10-CM

## 2013-07-29 DIAGNOSIS — O10019 Pre-existing essential hypertension complicating pregnancy, unspecified trimester: Secondary | ICD-10-CM

## 2013-07-29 LAB — POCT URINALYSIS DIPSTICK
Ketones, UA: NEGATIVE
Leukocytes, UA: NEGATIVE

## 2013-07-29 NOTE — Progress Notes (Signed)
Reactive NST BP weight and urine results all reviewed and noted. Patient reports good fetal movement, denies any bleeding and no rupture of membranes symptoms or regular contractions. Patient is without complaints. All questions were answered.  

## 2013-07-29 NOTE — Progress Notes (Signed)
C/o "numbness in whole body when pt lays down"

## 2013-08-02 ENCOUNTER — Ambulatory Visit (INDEPENDENT_AMBULATORY_CARE_PROVIDER_SITE_OTHER): Payer: Self-pay | Admitting: Women's Health

## 2013-08-02 ENCOUNTER — Encounter: Payer: Self-pay | Admitting: Women's Health

## 2013-08-02 ENCOUNTER — Other Ambulatory Visit: Payer: Self-pay | Admitting: Obstetrics & Gynecology

## 2013-08-02 ENCOUNTER — Ambulatory Visit (INDEPENDENT_AMBULATORY_CARE_PROVIDER_SITE_OTHER): Payer: Self-pay

## 2013-08-02 VITALS — BP 118/80 | Wt 227.0 lb

## 2013-08-02 DIAGNOSIS — Z1389 Encounter for screening for other disorder: Secondary | ICD-10-CM

## 2013-08-02 DIAGNOSIS — O10013 Pre-existing essential hypertension complicating pregnancy, third trimester: Secondary | ICD-10-CM

## 2013-08-02 DIAGNOSIS — O10913 Unspecified pre-existing hypertension complicating pregnancy, third trimester: Secondary | ICD-10-CM

## 2013-08-02 DIAGNOSIS — O99891 Other specified diseases and conditions complicating pregnancy: Secondary | ICD-10-CM

## 2013-08-02 DIAGNOSIS — O10019 Pre-existing essential hypertension complicating pregnancy, unspecified trimester: Secondary | ICD-10-CM

## 2013-08-02 DIAGNOSIS — O34219 Maternal care for unspecified type scar from previous cesarean delivery: Secondary | ICD-10-CM

## 2013-08-02 DIAGNOSIS — Z331 Pregnant state, incidental: Secondary | ICD-10-CM

## 2013-08-02 DIAGNOSIS — O0993 Supervision of high risk pregnancy, unspecified, third trimester: Secondary | ICD-10-CM

## 2013-08-02 LAB — POCT URINALYSIS DIPSTICK
Blood, UA: NEGATIVE
Glucose, UA: NEGATIVE
Ketones, UA: NEGATIVE

## 2013-08-02 NOTE — Progress Notes (Signed)
Had U/S Today.

## 2013-08-02 NOTE — Patient Instructions (Signed)

## 2013-08-02 NOTE — Progress Notes (Signed)
U/s(38+1WKS)-VTX ACTIVE FETUS, MEAS C/W DATES, FLUID WNL AFI-8.25CM, ANT GR 2 PLAC, EFW 6 LB 8 OZ (25TH%TILE), BPP 8/8, UA DOPPLER RI-0.57 & 0.66, FHR-135BPM

## 2013-08-02 NOTE — Progress Notes (Signed)
Reports good fm. Denies uc's, lof, vb, urinary frequency, urgency, hesitancy, or dysuria.  Bilateral arms from shoulders to fingers numb throughout day- not doing any repetitive mvmnts w/ arms, but they have been swelling.  Reviewed u/s results, labor s/s, fkc.  All questions answered. F/U in 3d for nst and visit.

## 2013-08-03 ENCOUNTER — Other Ambulatory Visit: Payer: Self-pay | Admitting: Obstetrics & Gynecology

## 2013-08-03 DIAGNOSIS — O10013 Pre-existing essential hypertension complicating pregnancy, third trimester: Secondary | ICD-10-CM

## 2013-08-05 ENCOUNTER — Encounter (HOSPITAL_COMMUNITY): Payer: Self-pay | Admitting: Pharmacy Technician

## 2013-08-05 ENCOUNTER — Encounter: Payer: Self-pay | Admitting: Advanced Practice Midwife

## 2013-08-05 ENCOUNTER — Ambulatory Visit (INDEPENDENT_AMBULATORY_CARE_PROVIDER_SITE_OTHER): Payer: Self-pay | Admitting: Advanced Practice Midwife

## 2013-08-05 ENCOUNTER — Ambulatory Visit (INDEPENDENT_AMBULATORY_CARE_PROVIDER_SITE_OTHER): Payer: Self-pay

## 2013-08-05 ENCOUNTER — Encounter (HOSPITAL_COMMUNITY)
Admission: RE | Disposition: A | Discharge status: Home / Self Care | Payer: Self-pay | Source: Ambulatory Visit | Attending: Obstetrics & Gynecology

## 2013-08-05 ENCOUNTER — Encounter (HOSPITAL_COMMUNITY): Payer: Self-pay | Admitting: Anesthesiology

## 2013-08-05 ENCOUNTER — Inpatient Hospital Stay (HOSPITAL_COMMUNITY)
Admission: RE | Admit: 2013-08-05 | Discharge: 2013-08-07 | DRG: 371 | Disposition: A | Discharge status: Home / Self Care | Payer: BC Managed Care – PPO | Source: Ambulatory Visit | Attending: Obstetrics & Gynecology | Admitting: Obstetrics & Gynecology

## 2013-08-05 ENCOUNTER — Inpatient Hospital Stay (HOSPITAL_COMMUNITY): Payer: BC Managed Care – PPO | Admitting: Anesthesiology

## 2013-08-05 ENCOUNTER — Encounter (HOSPITAL_COMMUNITY): Payer: Self-pay | Admitting: *Deleted

## 2013-08-05 ENCOUNTER — Other Ambulatory Visit: Payer: Self-pay | Admitting: Advanced Practice Midwife

## 2013-08-05 VITALS — BP 110/82 | Wt 228.0 lb

## 2013-08-05 DIAGNOSIS — O34219 Maternal care for unspecified type scar from previous cesarean delivery: Secondary | ICD-10-CM

## 2013-08-05 DIAGNOSIS — Z1389 Encounter for screening for other disorder: Secondary | ICD-10-CM

## 2013-08-05 DIAGNOSIS — O368190 Decreased fetal movements, unspecified trimester, not applicable or unspecified: Secondary | ICD-10-CM

## 2013-08-05 DIAGNOSIS — Z302 Encounter for sterilization: Secondary | ICD-10-CM

## 2013-08-05 DIAGNOSIS — Z23 Encounter for immunization: Secondary | ICD-10-CM

## 2013-08-05 DIAGNOSIS — O99891 Other specified diseases and conditions complicating pregnancy: Secondary | ICD-10-CM

## 2013-08-05 DIAGNOSIS — O10013 Pre-existing essential hypertension complicating pregnancy, third trimester: Secondary | ICD-10-CM

## 2013-08-05 DIAGNOSIS — O99892 Other specified diseases and conditions complicating childbirth: Secondary | ICD-10-CM | POA: Diagnosis present

## 2013-08-05 DIAGNOSIS — Z331 Pregnant state, incidental: Secondary | ICD-10-CM

## 2013-08-05 DIAGNOSIS — O10913 Unspecified pre-existing hypertension complicating pregnancy, third trimester: Secondary | ICD-10-CM

## 2013-08-05 DIAGNOSIS — O1002 Pre-existing essential hypertension complicating childbirth: Secondary | ICD-10-CM | POA: Diagnosis present

## 2013-08-05 DIAGNOSIS — O289 Unspecified abnormal findings on antenatal screening of mother: Secondary | ICD-10-CM

## 2013-08-05 DIAGNOSIS — O0993 Supervision of high risk pregnancy, unspecified, third trimester: Secondary | ICD-10-CM

## 2013-08-05 DIAGNOSIS — Z22330 Carrier of Group B streptococcus: Secondary | ICD-10-CM

## 2013-08-05 DIAGNOSIS — O288 Other abnormal findings on antenatal screening of mother: Secondary | ICD-10-CM

## 2013-08-05 DIAGNOSIS — O9989 Other specified diseases and conditions complicating pregnancy, childbirth and the puerperium: Secondary | ICD-10-CM

## 2013-08-05 DIAGNOSIS — O10019 Pre-existing essential hypertension complicating pregnancy, unspecified trimester: Secondary | ICD-10-CM

## 2013-08-05 LAB — POCT URINALYSIS DIPSTICK
Glucose, UA: NEGATIVE
Ketones, UA: NEGATIVE
Leukocytes, UA: NEGATIVE

## 2013-08-05 LAB — CBC
HCT: 35.1 % — ABNORMAL LOW (ref 36.0–46.0)
MCV: 85.2 fL (ref 78.0–100.0)
RBC: 4.12 MIL/uL (ref 3.87–5.11)
RDW: 13.9 % (ref 11.5–15.5)
WBC: 12.1 10*3/uL — ABNORMAL HIGH (ref 4.0–10.5)

## 2013-08-05 LAB — APTT: aPTT: 26 seconds (ref 24–37)

## 2013-08-05 SURGERY — Surgical Case
Anesthesia: Spinal | Site: Abdomen | Laterality: Bilateral | Wound class: Clean Contaminated

## 2013-08-05 MED ORDER — PHENYLEPHRINE HCL 10 MG/ML IJ SOLN
INTRAMUSCULAR | Status: DC | PRN
  Administered 2013-08-05: 120 ug via INTRAVENOUS
  Administered 2013-08-05: 80 ug via INTRAVENOUS
  Administered 2013-08-05: 40 ug via INTRAVENOUS

## 2013-08-05 MED ORDER — SIMETHICONE 80 MG PO CHEW
80.0000 mg | CHEWABLE_TABLET | Freq: Three times a day (TID) | ORAL | Status: DC
  Administered 2013-08-07 (×3): 80 mg via ORAL

## 2013-08-05 MED ORDER — BUPIVACAINE HCL (PF) 0.5 % IJ SOLN
INTRAMUSCULAR | Status: DC | PRN
  Administered 2013-08-05: 30 mL

## 2013-08-05 MED ORDER — BUPIVACAINE HCL (PF) 0.5 % IJ SOLN
INTRAMUSCULAR | Status: AC
  Filled 2013-08-05: qty 30

## 2013-08-05 MED ORDER — ONDANSETRON HCL 4 MG/2ML IJ SOLN
INTRAMUSCULAR | Status: AC
  Filled 2013-08-05: qty 2

## 2013-08-05 MED ORDER — SIMETHICONE 80 MG PO CHEW: 80.0000 mg | CHEWABLE_TABLET | ORAL | Status: DC | PRN

## 2013-08-05 MED ORDER — NALBUPHINE SYRINGE 5 MG/0.5 ML
INJECTION | INTRAMUSCULAR | Status: AC
  Administered 2013-08-05: 10 mg via SUBCUTANEOUS
  Filled 2013-08-05: qty 1

## 2013-08-05 MED ORDER — SCOPOLAMINE 1 MG/3DAYS TD PT72: 1.0000 | MEDICATED_PATCH | Freq: Once | TRANSDERMAL | Status: DC

## 2013-08-05 MED ORDER — INFLUENZA VAC SPLIT QUAD 0.5 ML IM SUSP: 0.5000 mL | INTRAMUSCULAR | Status: AC

## 2013-08-05 MED ORDER — FENTANYL CITRATE 0.05 MG/ML IJ SOLN
INTRAMUSCULAR | Status: DC | PRN
  Administered 2013-08-05: 25 ug via INTRATHECAL

## 2013-08-05 MED ORDER — MORPHINE SULFATE 0.5 MG/ML IJ SOLN
INTRAMUSCULAR | Status: AC
  Filled 2013-08-05: qty 10

## 2013-08-05 MED ORDER — FENTANYL CITRATE 0.05 MG/ML IJ SOLN
INTRAMUSCULAR | Status: AC
  Administered 2013-08-05: 50 ug via INTRAVENOUS
  Filled 2013-08-05: qty 2

## 2013-08-05 MED ORDER — MENTHOL 3 MG MT LOZG: 1.0000 | LOZENGE | OROMUCOSAL | Status: DC | PRN

## 2013-08-05 MED ORDER — LANOLIN HYDROUS EX OINT: 1.0000 "application " | TOPICAL_OINTMENT | CUTANEOUS | Status: DC | PRN

## 2013-08-05 MED ORDER — SIMETHICONE 80 MG PO CHEW: 80.0000 mg | CHEWABLE_TABLET | ORAL | Status: DC

## 2013-08-05 MED ORDER — METOCLOPRAMIDE HCL 5 MG/ML IJ SOLN: 10.0000 mg | Freq: Three times a day (TID) | INTRAMUSCULAR | Status: DC | PRN

## 2013-08-05 MED ORDER — MEASLES, MUMPS & RUBELLA VAC ~~LOC~~ INJ: 0.5000 mL | INJECTION | Freq: Once | SUBCUTANEOUS | Status: DC

## 2013-08-05 MED ORDER — METHYLDOPA 500 MG PO TABS
500.0000 mg | ORAL_TABLET | Freq: Two times a day (BID) | ORAL | Status: DC
  Filled 2013-08-05 (×2): qty 1

## 2013-08-05 MED ORDER — PHENYLEPHRINE 40 MCG/ML (10ML) SYRINGE FOR IV PUSH (FOR BLOOD PRESSURE SUPPORT)
PREFILLED_SYRINGE | INTRAVENOUS | Status: AC
  Filled 2013-08-05: qty 5

## 2013-08-05 MED ORDER — OXYCODONE-ACETAMINOPHEN 5-325 MG PO TABS
1.0000 | ORAL_TABLET | ORAL | Status: DC | PRN
  Administered 2013-08-06 (×2): 2 via ORAL
  Administered 2013-08-06 (×2): 1 via ORAL
  Administered 2013-08-07 (×3): 2 via ORAL
  Filled 2013-08-05 (×4): qty 2
  Filled 2013-08-05: qty 1
  Filled 2013-08-05 (×2): qty 2

## 2013-08-05 MED ORDER — SCOPOLAMINE 1 MG/3DAYS TD PT72
MEDICATED_PATCH | TRANSDERMAL | Status: AC
  Administered 2013-08-05: 1.5 mg via TRANSDERMAL
  Filled 2013-08-05: qty 1

## 2013-08-05 MED ORDER — FENTANYL CITRATE 0.05 MG/ML IJ SOLN
25.0000 ug | INTRAMUSCULAR | Status: DC | PRN
  Administered 2013-08-05 (×4): 50 ug via INTRAVENOUS

## 2013-08-05 MED ORDER — ONDANSETRON HCL 4 MG/2ML IJ SOLN: 4.0000 mg | INTRAMUSCULAR | Status: DC | PRN

## 2013-08-05 MED ORDER — DIPHENHYDRAMINE HCL 25 MG PO CAPS: 25.0000 mg | ORAL_CAPSULE | Freq: Four times a day (QID) | ORAL | Status: DC | PRN

## 2013-08-05 MED ORDER — OXYTOCIN 40 UNITS IN LACTATED RINGERS INFUSION - SIMPLE MED: 62.5000 mL/h | INTRAVENOUS | Status: AC

## 2013-08-05 MED ORDER — NALOXONE HCL 1 MG/ML IJ SOLN
1.0000 ug/kg/h | INTRAVENOUS | Status: DC | PRN
  Filled 2013-08-05: qty 2

## 2013-08-05 MED ORDER — KETOROLAC TROMETHAMINE 60 MG/2ML IM SOLN
60.0000 mg | Freq: Once | INTRAMUSCULAR | Status: AC | PRN
  Administered 2013-08-05: 60 mg via INTRAMUSCULAR

## 2013-08-05 MED ORDER — CEFAZOLIN SODIUM-DEXTROSE 2-3 GM-% IV SOLR
2.0000 g | INTRAVENOUS | Status: AC
  Administered 2013-08-05: 2 g via INTRAVENOUS

## 2013-08-05 MED ORDER — EPHEDRINE 5 MG/ML INJ
INTRAVENOUS | Status: AC
  Filled 2013-08-05: qty 10

## 2013-08-05 MED ORDER — BUPIVACAINE HCL (PF) 0.5 % IJ SOLN
INTRAMUSCULAR | Status: DC | PRN
  Administered 2013-08-05: 11 mg

## 2013-08-05 MED ORDER — PRENATAL MULTIVITAMIN CH
1.0000 | ORAL_TABLET | Freq: Every day | ORAL | Status: DC
  Administered 2013-08-06 – 2013-08-07 (×2): 1 via ORAL
  Filled 2013-08-05 (×2): qty 1

## 2013-08-05 MED ORDER — MAGNESIUM HYDROXIDE 400 MG/5ML PO SUSP: 30.0000 mL | ORAL | Status: DC | PRN

## 2013-08-05 MED ORDER — EPHEDRINE SULFATE 50 MG/ML IJ SOLN
INTRAMUSCULAR | Status: DC | PRN
  Administered 2013-08-05 (×3): 10 mg via INTRAVENOUS

## 2013-08-05 MED ORDER — ONDANSETRON HCL 4 MG/2ML IJ SOLN: 4.0000 mg | Freq: Three times a day (TID) | INTRAMUSCULAR | Status: DC | PRN

## 2013-08-05 MED ORDER — MEPERIDINE HCL 25 MG/ML IJ SOLN: 6.2500 mg | INTRAMUSCULAR | Status: DC | PRN

## 2013-08-05 MED ORDER — NALBUPHINE HCL 10 MG/ML IJ SOLN
5.0000 mg | INTRAMUSCULAR | Status: DC | PRN
  Administered 2013-08-05: 10 mg via SUBCUTANEOUS
  Filled 2013-08-05: qty 1

## 2013-08-05 MED ORDER — LACTATED RINGERS IV SOLN
Freq: Once | INTRAVENOUS | Status: AC
  Administered 2013-08-05: 13:00:00 via INTRAVENOUS

## 2013-08-05 MED ORDER — SENNOSIDES-DOCUSATE SODIUM 8.6-50 MG PO TABS
2.0000 | ORAL_TABLET | Freq: Every day | ORAL | Status: DC
  Administered 2013-08-05 – 2013-08-07 (×2): 2 via ORAL

## 2013-08-05 MED ORDER — NALBUPHINE HCL 10 MG/ML IJ SOLN
5.0000 mg | INTRAMUSCULAR | Status: DC | PRN
  Filled 2013-08-05: qty 1

## 2013-08-05 MED ORDER — OXYTOCIN 10 UNIT/ML IJ SOLN
INTRAMUSCULAR | Status: AC
  Filled 2013-08-05: qty 4

## 2013-08-05 MED ORDER — LACTATED RINGERS IV SOLN
INTRAVENOUS | Status: DC
  Administered 2013-08-05 (×2): via INTRAVENOUS

## 2013-08-05 MED ORDER — KETOROLAC TROMETHAMINE 30 MG/ML IJ SOLN: 30.0000 mg | Freq: Four times a day (QID) | INTRAMUSCULAR | Status: AC | PRN

## 2013-08-05 MED ORDER — ONDANSETRON HCL 4 MG PO TABS: 4.0000 mg | ORAL_TABLET | ORAL | Status: DC | PRN

## 2013-08-05 MED ORDER — DIPHENHYDRAMINE HCL 50 MG/ML IJ SOLN: 25.0000 mg | INTRAMUSCULAR | Status: DC | PRN

## 2013-08-05 MED ORDER — NALOXONE HCL 0.4 MG/ML IJ SOLN: 0.4000 mg | INTRAMUSCULAR | Status: DC | PRN

## 2013-08-05 MED ORDER — DIBUCAINE 1 % RE OINT: 1.0000 "application " | TOPICAL_OINTMENT | RECTAL | Status: DC | PRN

## 2013-08-05 MED ORDER — TETANUS-DIPHTH-ACELL PERTUSSIS 5-2.5-18.5 LF-MCG/0.5 IM SUSP
0.5000 mL | Freq: Once | INTRAMUSCULAR | Status: AC
  Administered 2013-08-05: 0.5 mL via INTRAMUSCULAR
  Filled 2013-08-05: qty 0.5

## 2013-08-05 MED ORDER — CEFAZOLIN SODIUM-DEXTROSE 2-3 GM-% IV SOLR
INTRAVENOUS | Status: AC
  Filled 2013-08-05: qty 50

## 2013-08-05 MED ORDER — DIPHENHYDRAMINE HCL 25 MG PO CAPS
25.0000 mg | ORAL_CAPSULE | ORAL | Status: DC | PRN
  Administered 2013-08-05: 25 mg via ORAL
  Filled 2013-08-05 (×2): qty 1

## 2013-08-05 MED ORDER — SODIUM CHLORIDE 0.9 % IJ SOLN: 3.0000 mL | INTRAMUSCULAR | Status: DC | PRN

## 2013-08-05 MED ORDER — LACTATED RINGERS IV SOLN
INTRAVENOUS | Status: DC
  Administered 2013-08-05: 23:00:00 via INTRAVENOUS

## 2013-08-05 MED ORDER — FENTANYL CITRATE 0.05 MG/ML IJ SOLN
INTRAMUSCULAR | Status: AC
  Filled 2013-08-05: qty 2

## 2013-08-05 MED ORDER — ZOLPIDEM TARTRATE 5 MG PO TABS: 5.0000 mg | ORAL_TABLET | Freq: Every evening | ORAL | Status: DC | PRN

## 2013-08-05 MED ORDER — WITCH HAZEL-GLYCERIN EX PADS: 1.0000 "application " | MEDICATED_PAD | CUTANEOUS | Status: DC | PRN

## 2013-08-05 MED ORDER — SCOPOLAMINE 1 MG/3DAYS TD PT72
1.0000 | MEDICATED_PATCH | Freq: Once | TRANSDERMAL | Status: DC
  Administered 2013-08-05: 1.5 mg via TRANSDERMAL

## 2013-08-05 MED ORDER — KETOROLAC TROMETHAMINE 60 MG/2ML IM SOLN
INTRAMUSCULAR | Status: AC
  Administered 2013-08-05: 60 mg via INTRAMUSCULAR
  Filled 2013-08-05: qty 2

## 2013-08-05 MED ORDER — MORPHINE SULFATE (PF) 0.5 MG/ML IJ SOLN
INTRAMUSCULAR | Status: DC | PRN
  Administered 2013-08-05: 150 ug via INTRATHECAL

## 2013-08-05 MED ORDER — IBUPROFEN 600 MG PO TABS
600.0000 mg | ORAL_TABLET | Freq: Four times a day (QID) | ORAL | Status: DC
  Administered 2013-08-05 – 2013-08-07 (×7): 600 mg via ORAL
  Filled 2013-08-05 (×7): qty 1

## 2013-08-05 MED ORDER — DIPHENHYDRAMINE HCL 50 MG/ML IJ SOLN: 12.5000 mg | INTRAMUSCULAR | Status: DC | PRN

## 2013-08-05 SURGICAL SUPPLY — 29 items
APL SKNCLS STERI-STRIP NONHPOA (GAUZE/BANDAGES/DRESSINGS) ×1
BENZOIN TINCTURE PRP APPL 2/3 (GAUZE/BANDAGES/DRESSINGS) ×1 IMPLANT
CLIP FILSHIE TUBAL LIGA STRL (Clip) ×1 IMPLANT
CLOTH BEACON ORANGE TIMEOUT ST (SAFETY) ×2 IMPLANT
DEVICE BLD TRNS LUER ATTCH (MISCELLANEOUS) ×2 IMPLANT
DRAPE LG THREE QUARTER DISP (DRAPES) ×4 IMPLANT
DRSG OPSITE POSTOP 4X10 (GAUZE/BANDAGES/DRESSINGS) ×2 IMPLANT
DURAPREP 26ML APPLICATOR (WOUND CARE) ×2 IMPLANT
ELECT REM PT RETURN 9FT ADLT (ELECTROSURGICAL) ×2
ELECTRODE REM PT RTRN 9FT ADLT (ELECTROSURGICAL) ×1 IMPLANT
GLOVE BIO SURGEON STRL SZ 6.5 (GLOVE) ×2 IMPLANT
GOWN PREVENTION PLUS XLARGE (GOWN DISPOSABLE) ×4 IMPLANT
GOWN STRL REIN XL XLG (GOWN DISPOSABLE) ×4 IMPLANT
NDL SPNL 18GX3.5 QUINCKE PK (NEEDLE) ×1 IMPLANT
NEEDLE SPNL 18GX3.5 QUINCKE PK (NEEDLE) ×2 IMPLANT
NS IRRIG 1000ML POUR BTL (IV SOLUTION) ×2 IMPLANT
PACK C SECTION WH (CUSTOM PROCEDURE TRAY) ×2 IMPLANT
PAD OB MATERNITY 4.3X12.25 (PERSONAL CARE ITEMS) ×2 IMPLANT
STRIP CLOSURE SKIN 1/2X4 (GAUZE/BANDAGES/DRESSINGS) ×1 IMPLANT
SUT PDS AB 0 CTX 60 (SUTURE) ×1 IMPLANT
SUT VIC AB 2-0 CT1 27 (SUTURE) ×2
SUT VIC AB 2-0 CT1 TAPERPNT 27 (SUTURE) ×1 IMPLANT
SUT VIC AB 2-0 CTX 36 (SUTURE) ×4 IMPLANT
SUT VIC AB 3-0 CT1 27 (SUTURE) ×2
SUT VIC AB 3-0 CT1 TAPERPNT 27 (SUTURE) ×1 IMPLANT
SYR 30ML LL (SYRINGE) ×2 IMPLANT
TOWEL OR 17X24 6PK STRL BLUE (TOWEL DISPOSABLE) ×2 IMPLANT
TRAY FOLEY CATH 14FR (SET/KITS/TRAYS/PACK) ×2 IMPLANT
WATER STERILE IRR 1000ML POUR (IV SOLUTION) ×2 IMPLANT

## 2013-08-05 NOTE — Anesthesia Postprocedure Evaluation (Signed)
  Anesthesia Post Note  Patient: Sarah Eaton  Procedure(s) Performed: Procedure(s) (LRB): REPEAT CESAREAN SECTION WITH BILATERAL TUBAL LIGATION (Bilateral)  Anesthesia type: Spinal  Patient location: PACU  Post pain: Pain level controlled  Post assessment: Post-op Vital signs reviewed  Last Vitals:  Filed Vitals:   08/05/13 1500  BP: 119/95  Pulse: 70  Temp:   Resp: 20    Post vital signs: Reviewed  Level of consciousness: awake  Complications: No apparent anesthesia complications

## 2013-08-05 NOTE — Progress Notes (Signed)
U/S(38+4wks)-vtx  NON ACTIVE fetus, +Resp movements noted although no limb, tone movements noted, AFI-13.8cm fluid wnl, UA Doppler RI-0.57 BPP 4/10 female fetus 

## 2013-08-05 NOTE — Addendum Note (Signed)
Addended by: Jacklyn Shell on: 08/05/2013 11:06 AM   Modules accepted: Level of Service

## 2013-08-05 NOTE — Consult Note (Signed)
Asked to attend C- section delivery by Dr. Marice Potter. Infant arrived on heated warmer bed vigorous and crying.  Initial drying and bulb syringe suctioning performed. Three vessel cord noted, palate intact, anus patent, normal appearing female genitalia. Observed infant until 5 minute apgar was assigned. Left infant on warmer bed pink and crying with OR staff.

## 2013-08-05 NOTE — Transfer of Care (Signed)
Immediate Anesthesia Transfer of Care Note  Patient: Sarah Eaton  Procedure(s) Performed: Procedure(s): REPEAT CESAREAN SECTION WITH BILATERAL TUBAL LIGATION (Bilateral)  Patient Location: PACU  Anesthesia Type:Regional and Spinal  Level of Consciousness: awake, alert  and oriented  Airway & Oxygen Therapy: Patient Spontanous Breathing  Post-op Assessment: Report given to PACU RN and Post -op Vital signs reviewed and stable  Post vital signs: Reviewed and stable  Complications: No apparent anesthesia complications

## 2013-08-05 NOTE — Anesthesia Preprocedure Evaluation (Signed)
Anesthesia Evaluation  Patient identified by MRN, date of birth, ID band Patient awake    Reviewed: Allergy & Precautions, H&P , NPO status , Patient's Chart, lab work & pertinent test results, reviewed documented beta blocker date and time   History of Anesthesia Complications Negative for: history of anesthetic complications  Airway Mallampati: III TM Distance: >3 FB Neck ROM: full    Dental  (+) Teeth Intact   Pulmonary neg pulmonary ROS,  breath sounds clear to auscultation        Cardiovascular hypertension (CHTN), On Medications Rhythm:regular Rate:Normal     Neuro/Psych negative neurological ROS  negative psych ROS   GI/Hepatic negative GI ROS, Neg liver ROS,   Endo/Other  negative endocrine ROSMorbid obesity  Renal/GU negative Renal ROS  negative genitourinary   Musculoskeletal   Abdominal   Peds  Hematology negative hematology ROS (+)   Anesthesia Other Findings Last ate at [employer] at 4:30 am  Reproductive/Obstetrics (+) Pregnancy (h/o c/s x1, for repeat for NRFS)                           Anesthesia Physical Anesthesia Plan  ASA: III and emergent  Anesthesia Plan: Spinal   Post-op Pain Management:    Induction:   Airway Management Planned:   Additional Equipment:   Intra-op Plan:   Post-operative Plan:   Informed Consent: I have reviewed the patients History and Physical, chart, labs and discussed the procedure including the risks, benefits and alternatives for the proposed anesthesia with the patient or authorized representative who has indicated his/her understanding and acceptance.     Plan Discussed with: Surgeon and CRNA  Anesthesia Plan Comments:         Anesthesia Quick Evaluation

## 2013-08-05 NOTE — H&P (Signed)
Sarah Eaton is a 27 y.o. female G2P1001 with IUP at [redacted]w[redacted]d presenting for elective repeat cesarean section for a BPP of 4/8 in clinic this AM. 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 This AM, she had a  BPP of 4/8 and was scheduled for a repeat c/s in 3 days @[redacted]w[redacted]d  but given the fetal testing, she was sent for c/s today. She last ate at 5am.   . Pt states she has been having no contractions, associated with none vaginal bleeding.  Membranes are intact, with decreased  fetal movement.  No HA, vision changes, lee.    [facility] Complicated only by [facility] which has been well controlled. She is GBS +. Otherwise no abnormal labs. Normal anatomy and growth (32%ile).   Prenatal History/Complications:  Past Medical History: Past Medical History  Diagnosis Date  . Hypertension     Past Surgical History: Past Surgical History  Procedure Laterality Date  . Cesarean section      Obstetrical History: OB History   Grav Para Term Preterm Abortions TAB SAB Ect Mult Living   2 1 1       1       Social History: History   Social History  . Marital Status: Married    Spouse Name: N/A    Number of Children: N/A  . Years of Education: N/A   Social History Main Topics  . Smoking status: Never Smoker   . Smokeless tobacco: Never Used  . Alcohol Use: No  . Drug Use: No  . Sexual Activity: Yes    Birth Control/ Protection: None   Other Topics Concern  . Not on file   Social History Narrative  . No narrative on file    Family History: Family History  Problem Relation Age of Onset  . Hypertension Mother   . Cancer Mother     skin  . Cancer Maternal Grandmother   . Hypertension Maternal Grandmother   . Cancer Other     breast    Allergies: No Known Allergies  No prescriptions prior to admission     Review of Systems   Constitutional: Negative for fever, chills, weight loss, malaise/fatigue  and diaphoresis.  HENT: Negative for hearing loss, ear pain, nosebleeds, congestion, sore throat, neck pain, tinnitus and ear discharge.   Eyes: Negative for blurred vision, double vision, photophobia, pain, discharge and redness.  Respiratory: Negative for cough, hemoptysis, sputum production, shortness of breath, wheezing and stridor.   Cardiovascular: Negative for chest pain, palpitations, orthopnea,  leg swelling  Gastrointestinal: Positive for abdominal pain. Negative for heartburn, nausea, vomiting, diarrhea, constipation, blood in stool Genitourinary: Negative for dysuria, urgency, frequency, hematuria and flank pain.  Musculoskeletal: Negative for myalgias, back pain, joint pain and falls.  Skin: Negative for itching and rash.  Neurological: Negative for dizziness, tingling, tremors, sensory change, speech change, focal weakness, seizures, loss of consciousness, weakness and headaches.  Endo/Heme/Allergies: Negative for environmental allergies and polydipsia. Does not bruise/bleed easily.  Psychiatric/Behavioral: Negative for depression, suicidal ideas, hallucinations, memory loss and substance abuse. The patient is not nervous/anxious and does not have insomnia.       Last menstrual period 11/08/2012. General appearance: alert, cooperative and no distress Lungs: clear to auscultation bilaterally Heart: regular rate and rhythm Abdomen: soft, non-tender; bowel sounds normal Pelvic: deferred Extremities: Homans sign is negative, no sign of DVT Presentation: cephalic Fetal monitoring dopplers of 140  Prenatal labs: ABO, Rh: A/Positive/-- (02/18 0000) Antibody: NEG (07/03 0933) Rubella:   RPR: NON REAC (07/03 0933)  HBsAg: Negative (02/18 0000)  HIV: NON REACTIVE (07/03 0933)  GBS: Positive (02/25 0000)  1 hr Glucola 69 Genetic screening  normal Anatomy US normal    Assessment: Sarah Eaton is a 27 y.o. G2P1001 with an IUP at [redacted]w[redacted]d presenting for repeat c/s with  a BPP of 4/8 and hx of prior c/s.   Plan: The risks of cesarean section discussed with the patient included but were not limited to: bleeding which may require transfusion or reoperation; infection which may require antibiotics; injury to bowel, bladder, ureters or other surrounding organs; injury to the fetus; need for additional procedures including hysterectomy in the event of a life-threatening hemorrhage; placental abnormalities wth subsequent pregnancies, incisional problems, thromboembolic phenomenon and other postoperative/anesthesia complications. The patient concurred with the proposed plan, giving informed written consent for the procedure.   Patient has been NPO since 5am she will remain NPO for procedure. Anesthesia and OR aware. Preoperative prophylactic antibiotics and SCDs ordered on call to the OR.  To OR when ready.     Vale Haven, MD 08/05/2013, 11:41 AM

## 2013-08-05 NOTE — Op Note (Signed)
PROCEDURE DATE: 08/05/2013    PREOPERATIVE DIAGNOSES: Intrauterine pregnancy at 38.[redacted] weeks gestation; elective repeat c/s; BPP of 4/8- nonreassuring antenatal fetal testing.   POSTOPERATIVE DIAGNOSES: The same   PROCEDURE: Repeat Low Transverse Cesarean Section, Bilateral Tubal Sterilization using Filshie clips   SURGEON: Dr. Nicholaus Bloom   ASSISTANT: Dr. Rulon Abide    INDICATIONS: Sarah Eaton is a 27 yo G2P1001 at [redacted]w[redacted]d here for cesarean section and bilateral tubal sterilization secondary to the indications listed under preoperative diagnoses; please see preoperative note for further details. The risks of surgery were discussed with the patient including but were not limited to: bleeding which may require transfusion or reoperation; infection which may require antibiotics; injury to bowel, bladder, ureters or other surrounding organs; injury to the fetus; need for additional procedures including hysterectomy in the event of a life-threatening hemorrhage; placental abnormalities wth subsequent pregnancies, incisional problems, thromboembolic phenomenon and other postoperative/anesthesia complications. Patient also desires permanent sterilization. Other reversible forms of contraception were discussed with patient; she declines all other modalities. Risks of procedure discussed with patient including but not limited to: risk of regret, permanence of method, bleeding, infection, injury to surrounding organs and need for additional procedures. Failure risk of 1-2% with increased risk of ectopic gestation if pregnancy occurs was also discussed with patient. The patient concurred with the proposed plan, giving informed written consent for the procedures.   FINDINGS: Viable female infant in cephalic presentation. Apgars 8 and 9. Clear amniotic fluid. Intact placenta, three vessel cord. Normal uterus, fallopian tubes and ovaries bilaterally. Fallopian tubes sterilized with Filshie clips bilaterally.    ANESTHESIA: Spinal   INTRAVENOUS FLUIDS: 3000 ml  ESTIMATED BLOOD LOSS: 600 ml  URINE OUTPUT: 200 ml   SPECIMENS: Placenta sent to L&D   COMPLICATIONS: None immediate   PROCEDURE IN DETAIL: The patient preoperatively received intravenous antibiotics and had sequential compression devices applied to her lower extremities. She was then taken to the operating room where spinal anesthesia was administered and was found to be adequate. She was then placed in a dorsal supine position with a leftward tilt, and prepped and draped in a sterile manner. A foley catheter was placed into her bladder and attached to constant gravity. After an adequate timeout was performed, 30 cc of 0.5% marcaine was injected into the old scar and a Pfannenstiel skin incision was made with scalpel and carried through to the underlying layer of fascia. The fascia was incised in the midline, and this incision was extended bilaterally using the Mayo scissors. Kocher clamps were applied to the superior aspect of the fascial incision and the underlying rectus muscles were dissected off bluntly. A similar process was carried out on the inferior aspect of the fascial incision. The rectus muscles were incised partially with the bovie and the peritoneum was entered bluntly. Attention was turned to the lower uterine segment where a low transverse hysterotomy was made with a scalpel and extended bilaterally bluntly. The infant was successfully delivered with a double nuchal cord that was easily reduced, the cord was clamped and cut and the infant was handed over to awaiting neonatology team. The placenta was manually extracted intact with a three-vessel cord. The uterus was then cleared of clot and debris. The hysterotomy was closed with 2-0 Vicryl in a running locked fashion. Attention was then turned to the fallopian tubes, at the isthmus, with care given to incorporate the underlying mesosalpinx on both sides with the filshie clips,  allowing for bilateral tubal sterilization. The pelvis was  cleared of all clot and debris. Hemostasis was confirmed on all surfaces. The fascia was then closed using 0 PDS in a running fashion. The subcutaneous layer was irrigated. The skin was closed with a 4-0 Vicryl subcuticular stitch. The patient tolerated the procedure well. Sponge, lap, instrument and needle counts were correct x 2. She was taken to the recovery room in stable condition.

## 2013-08-05 NOTE — Anesthesia Procedure Notes (Signed)
Spinal  Patient location during procedure: OR Start time: 08/05/2013 1:47 PM Staffing Anesthesiologist: Angus Seller., Harrell Gave. Performed by: anesthesiologist  Preanesthetic Checklist Completed: patient identified, site marked, surgical consent, pre-op evaluation, timeout performed, IV checked, risks and benefits discussed and monitors and equipment checked Spinal Block Patient position: sitting Prep: DuraPrep Patient monitoring: heart rate, cardiac monitor, continuous pulse ox and blood pressure Approach: midline Location: L3-4 Injection technique: single-shot Needle Needle type: Sprotte  Needle gauge: 24 G Needle length: 9 cm Assessment Sensory level: T4 Additional Notes Patient identified.  Risk benefits discussed including failed block, incomplete pain control, headache, nerve damage, paralysis, blood pressure changes, nausea, vomiting, reactions to medication both toxic or allergic, and postpartum back pain.  Patient expressed understanding and wished to proceed.  All questions were answered.  Sterile technique used throughout procedure.  CSF was clear.  No parasthesia or other complications.  Please see nursing notes for vital signs.

## 2013-08-05 NOTE — Progress Notes (Addendum)
Non reactive NST. BPP ordered: 4/10    Got flu shot C/S.Marland Kitchen    No c/o at this time.  Dr. Despina Hidden in to see.Will do c/s today.  F/U in 6 weeks for ppcheck up.

## 2013-08-06 ENCOUNTER — Encounter (HOSPITAL_COMMUNITY): Payer: Self-pay | Admitting: Obstetrics & Gynecology

## 2013-08-06 LAB — CBC
HCT: 26.3 % — ABNORMAL LOW (ref 36.0–46.0)
MCH: 28.9 pg (ref 26.0–34.0)
MCV: 86.5 fL (ref 78.0–100.0)
Platelets: 186 10*3/uL (ref 150–400)
RBC: 3.04 MIL/uL — ABNORMAL LOW (ref 3.87–5.11)
WBC: 11.3 10*3/uL — ABNORMAL HIGH (ref 4.0–10.5)

## 2013-08-06 LAB — BIRTH TISSUE RECOVERY COLLECTION (PLACENTA DONATION)

## 2013-08-06 LAB — TYPE AND SCREEN: ABO/RH(D): A POS

## 2013-08-06 NOTE — Progress Notes (Signed)
Post Partum Day 1, RLTCS/BTL Subjective: no complaints, up ad lib, voiding and tolerating PO, small lochia, plans to breastfeed, no flatus yet  Objective: Blood pressure 121/77, pulse 99, temperature 98.1 F (36.7 C), temperature source Oral, resp. rate 18, weight 103.42 kg (228 lb), last menstrual period 11/08/2012, SpO2 99.00%, unknown if currently breastfeeding.  Physical Exam:  General: alert, cooperative and no distress Lochia:normal flow Chest: CTAB Heart: RRR no m/r/g Abdomen: +BS, soft, nontender, dsg intact, no bleeding Uterine Fundus: firm DVT Evaluation: No evidence of DVT seen on physical exam. Extremities: trace edema   Recent Labs  08/05/13 1220 08/06/13 0535  HGB 11.8* 8.8*  HCT 35.1* 26.3*    Assessment/Plan: Plan for discharge tomorrow   LOS: 1 day   CRESENZO-DISHMAN,Fount Bahe 08/06/2013, 8:00 AM

## 2013-08-06 NOTE — Progress Notes (Addendum)
2352 BP 108/70.  Harlin Heys, MD regarding parameters for BP medication Aldomet.  Verbal order given to hold BP medication when BP lower than 130/70.  Will continue to monitor. Dahlia Byes Boschen

## 2013-08-06 NOTE — Anesthesia Postprocedure Evaluation (Signed)
Anesthesia Post Note  Patient: Sarah Eaton  Procedure(s) Performed: Procedure(s) (LRB): REPEAT CESAREAN SECTION WITH BILATERAL TUBAL LIGATION (Bilateral)  Anesthesia type: Epidural  Patient location: Mother/Baby  Post pain: Pain level controlled  Post assessment: Post-op Vital signs reviewed  Last Vitals:  Filed Vitals:   08/06/13 0400  BP: 121/77  Pulse: 99  Temp: 36.7 C  Resp: 18    Post vital signs: Reviewed  Level of consciousness:alert  Complications: No apparent anesthesia complications

## 2013-08-06 NOTE — Progress Notes (Addendum)
Correction Order given to hold BP medication when lower than 130/80.

## 2013-08-07 MED ORDER — OXYCODONE-ACETAMINOPHEN 5-325 MG PO TABS: 1.0000 | ORAL_TABLET | ORAL | Status: DC | PRN

## 2013-08-07 MED ORDER — FERROUS SULFATE 325 (65 FE) MG PO TABS: 325.0000 mg | ORAL_TABLET | Freq: Two times a day (BID) | ORAL | Status: DC

## 2013-08-07 MED ORDER — IBUPROFEN 600 MG PO TABS: 600.0000 mg | ORAL_TABLET | Freq: Four times a day (QID) | ORAL | Status: DC | PRN

## 2013-08-07 NOTE — Discharge Summary (Signed)
Obstetric Discharge Summary Reason for Admission: Elective RLTCS d/t BPP 4/8 and BTL Prenatal Procedures: NST and ultrasound Intrapartum Procedures: cesarean: low cervical, transverse and tubal ligation Postpartum Procedures: none Complications-Operative and Postpartum: none Eating, drinking, voiding, ambulating well.  +flatus.  Lochia and pain wnl.  Denies dizziness, lightheadedness, or sob. No complaints. Desires early d/c  Hemoglobin  Date Value Range Status  08/06/2013 8.8* 12.0 - 15.0 g/dL Final     DELTA CHECK NOTED     REPEATED TO VERIFY     HCT  Date Value Range Status  08/06/2013 26.3* 36.0 - 46.0 % Final    Physical Exam:  General: alert, cooperative and no distress Lochia: appropriate Uterine Fundus: firm Incision: healing well, no significant drainage, no dehiscence, no significant erythema DVT Evaluation: No evidence of DVT seen on physical exam. Negative Homan's sign. No cords or calf tenderness. No significant calf/ankle edema.  Discharge Diagnoses: Term Pregnancy-delivered, BTL  Discharge Information: Date: 08/07/2013 Activity: pelvic rest Diet: routine Medications: PNV, Ibuprofen, Percocet and aldomet, iron Condition: stable Instructions: refer to practice specific booklet Discharge to: home Follow-up Information   Follow up with [facility] On 08/16/2013. (as scheduled for incision check)    Specialty:  Obstetrics and Gynecology   Contact information:   [address] [place] [address] [phone]      Newborn Data: Live born female  Birth Weight: 6 lb 11.8 oz (3055 g) APGAR: 8, 9  Home with mother. Breastfeeding. Contraception: s/p BTL. Circumcision at FT  Marge Duncans 08/07/2013, 11:13 AM

## 2013-08-09 ENCOUNTER — Encounter: Payer: BC Managed Care – PPO | Admitting: Women's Health

## 2013-08-09 ENCOUNTER — Inpatient Hospital Stay (HOSPITAL_COMMUNITY): Admission: RE | Admit: 2013-08-09 | Payer: BC Managed Care – PPO | Source: Ambulatory Visit

## 2013-08-09 ENCOUNTER — Other Ambulatory Visit: Payer: BC Managed Care – PPO

## 2013-08-16 ENCOUNTER — Encounter: Payer: Self-pay | Admitting: Obstetrics & Gynecology

## 2013-08-16 ENCOUNTER — Ambulatory Visit (INDEPENDENT_AMBULATORY_CARE_PROVIDER_SITE_OTHER): Payer: Self-pay | Admitting: Obstetrics & Gynecology

## 2013-08-16 VITALS — BP 120/88 | Ht 62.0 in | Wt 206.5 lb

## 2013-08-16 DIAGNOSIS — Z9889 Other specified postprocedural states: Secondary | ICD-10-CM

## 2013-08-16 NOTE — Progress Notes (Signed)
Patient ID: Sarah Eaton, female   DOB: [birth year], 27 y.o.   MRN: [id number] Incision clean dry intact No complaints No pain  Follow up in 5 weeks

## 2013-09-01 ENCOUNTER — Telehealth: Payer: Self-pay | Admitting: Obstetrics and Gynecology

## 2013-09-01 NOTE — Telephone Encounter (Signed)
Pt going to have form faxed today.

## 2013-09-20 ENCOUNTER — Ambulatory Visit (INDEPENDENT_AMBULATORY_CARE_PROVIDER_SITE_OTHER): Payer: BC Managed Care – PPO | Admitting: Women's Health

## 2013-09-20 ENCOUNTER — Encounter: Payer: Self-pay | Admitting: Women's Health

## 2013-09-20 VITALS — BP 120/82 | Ht 63.0 in | Wt 202.0 lb

## 2013-09-20 DIAGNOSIS — R309 Painful micturition, unspecified: Secondary | ICD-10-CM

## 2013-09-20 DIAGNOSIS — R300 Dysuria: Secondary | ICD-10-CM

## 2013-09-20 DIAGNOSIS — O0990 Supervision of high risk pregnancy, unspecified, unspecified trimester: Secondary | ICD-10-CM

## 2013-09-20 DIAGNOSIS — F53 Postpartum depression: Secondary | ICD-10-CM

## 2013-09-20 LAB — POCT URINALYSIS DIPSTICK
Glucose, UA: NEGATIVE
Ketones, UA: NEGATIVE

## 2013-09-20 MED ORDER — ESCITALOPRAM OXALATE 10 MG PO TABS: 10.0000 mg | ORAL_TABLET | Freq: Every day | ORAL | Status: DC

## 2013-09-20 NOTE — Progress Notes (Signed)
Patient ID: Sarah Eaton, female   DOB: February 09, 1986, 27 y.o.   MRN: [id number] Subjective:    Sarah Eaton is a 27 y.o. G67P2002 Caucasian female who presents for a postpartum visit. She is 6.5 weeks postpartum following a low cervical transverse Cesarean section and BTL. I have fully reviewed the prenatal and intrapartum course. The delivery was at 38.4 gestational weeks d/t BPP 4/8. Outcome: repeat cesarean section, low transverse incision. Anesthesia: spinal. She was on aldomet during pregnancy for [facility], but states she was not on anything prior to pregnancy and that her bp's were low at that time. Stopped taking aldomet upon d/c from hospital. Postpartum course has been complicated by crying throughout the day, not able to sleep, goes a few days only eating ~61meal/day, feels overwhelmed and depressed, not really finding joy in things she used to and just doesn't feel like herself.  Denies SI/HI/II.  Denies h/o depression. Discussed option of SSRI, pt would like to try. Understands it takes 3-4weeks to begin working. Baby's course has been uncomplicated until this week, he has bilateral ear infections and an URI. Baby is feeding by breastexclusively, but pt feels like her supply is decreasing. We discussed ways to boost supply including frequent feedings, pumping after feedings, adequate fluid and food intake, and fenugreek 3pills tid if needed. Bleeding states she bled x ~5wks, stopped x 1 week, and began bleeding friday.. Bowel function is constipation, takes colace daily which helps. . Bladder function is: reports dysuria, hesitancy x 1week. Denies frequency, urgency. Patient is not sexually active. Contraception method is tubal ligation, which she is saying she regrets.  States baby is 'so perfect, I would have another tomorrow if I could, but I know I can't afford anymore, and I know I did the right thing by having my tubes tied.' Postpartum depression screening: positive, score of 23.  The following  portions of the patient's history were reviewed and updated as appropriate: allergies, current medications, past medical history, past surgical history and problem list.  Review of Systems Pertinent items are noted in HPI.   Filed Vitals:   09/20/13 1349  BP: 120/82  Height: 5\' 3"  (1.6 m)  Weight: 202 lb (91.627 kg)    Objective:     General:  alert, cooperative and no distress   Breasts:  deferred, no complaints  Lungs: clear to auscultation bilaterally  Heart:  regular rate and rhythm  Abdomen: soft, nontender, incision well healed   Vulva: normal  Vagina: normal vagina  Cervix:  closed  Corpus: Well-involuted  Adnexa:  Non-palpable  Rectal Exam: No hemorrhoids        Results for orders placed in visit on 09/20/13 (from the past 24 hour(s))  POCT URINALYSIS DIPSTICK     Status: None   Collection Time    09/20/13  1:52 PM      Result Value Range   Color, UA       Clarity, UA       Glucose, UA neg     Bilirubin, UA       Ketones, UA neg     Spec Grav, UA       Blood, UA 2+     pH, UA       Protein, UA neg     Urobilinogen, UA       Nitrite, UA neg     Leukocytes, UA Negative      Assessment:   Postpartum exam 6.5 wks s/p RLTCS and BTL Depression  screening PPD H/O CHTN, BP currently stable w/o meds  Plan:   Contraception: tubal ligation Follow up in: 4 weeks for pap & physical and f/u lexapro, or earlier as needed.   Constipation prevention/relief measures reviewed and printed info given Info on PPD given Rx Lexapro 10mg  daily #30 w/ 3RF Reviewed ways to increase milk supply Will send urine cx based on symptoms  Cheral Marker, CNM, [facility] 09/20/2013 2:34 PM

## 2013-09-20 NOTE — Patient Instructions (Signed)
Constipation  Drink plenty of fluid, preferably water, throughout the day  Eat foods high in fiber such as fruits, vegetables, and grains  Exercise, such as walking, is a good way to keep your bowels regular  Drink warm fluids, especially warm prune juice, or decaf coffee  Eat a 1/2 cup of real oatmeal (not instant), 1/2 cup applesauce, and 1/2-1 cup warm prune juice every day  If needed, you may take Colace (docusate sodium) stool softener once or twice a day to help keep the stool soft. If you are pregnant, wait until you are out of your first trimester (12-14 weeks of pregnancy)  If you still are having problems with constipation, you may take Miralax once daily as needed to help keep your bowels regular.  If you are pregnant, wait until you are out of your first trimester (12-14 weeks of pregnancy)   Postpartum Depression and Baby Blues The postpartum period begins right after the birth of a baby. During this time, there is often a great amount of joy and excitement. It is also a time of considerable changes in the life of the parent(s). Regardless of how many times a mother gives birth, each child brings new challenges and dynamics to the family. It is not unusual to have feelings of excitement accompanied by confusing shifts in moods, emotions, and thoughts. All mothers are at risk of developing postpartum depression or the "baby blues." These mood changes can occur right after giving birth, or they may occur many months after giving birth. The baby blues or postpartum depression can be mild or severe. Additionally, postpartum depression can resolve rather quickly, or it can be a long-term condition. CAUSES Elevated hormones and their rapid decline are thought to be a main cause of postpartum depression and the baby blues. There are a number of hormones that radically change during and after pregnancy. Estrogen and progesterone usually decrease immediately after delivering your baby. The  level of thyroid hormone and various cortisol steroids also rapidly drop. Other factors that play a major role in these changes include major life events and genetics.  RISK FACTORS If you have any of the following risks for the baby blues or postpartum depression, know what symptoms to watch out for during the postpartum period. Risk factors that may increase the likelihood of getting the baby blues or postpartum depression include:  Havinga personal or family history of depression.  Having depression while being pregnant.  Having premenstrual or oral contraceptive-associated mood issues.  Having exceptional life stress.  Having marital conflict.  Lacking a social support network.  Having a baby with special needs.  Having health problems such as diabetes. SYMPTOMS Baby blues symptoms include:  Brief fluctuations in mood, such as going from extreme happiness to sadness.  Decreased concentration.  Difficulty sleeping.  Crying spells, tearfulness.  Irritability.  Anxiety. Postpartum depression symptoms typically begin within the first month after giving birth. These symptoms include:  Difficulty sleeping or excessive sleepiness.  Marked weight loss.  Agitation.  Feelings of worthlessness.  Lack of interest in activity or food. Postpartum psychosis is a very concerning condition and can be dangerous. Fortunately, it is rare. Displaying any of the following symptoms is cause for immediate medical attention. Postpartum psychosis symptoms include:  Hallucinations and delusions.  Bizarre or disorganized behavior.  Confusion or disorientation. DIAGNOSIS  A diagnosis is made by an evaluation of your symptoms. There are no medical or lab tests that lead to a diagnosis, but there are various questionnaires  that a caregiver may use to identify those with the baby blues, postpartum depression, or psychosis. Often times, a screening tool called the [place] Postnatal  Depression Scale is used to diagnose depression in the postpartum period.  TREATMENT The baby blues usually goes away on its own in 1 to 2 weeks. Social support is often all that is needed. You should be encouraged to get adequate sleep and rest. Occasionally, you may be given medicines to help you sleep.  Postpartum depression requires treatment as it can last several months or longer if it is not treated. Treatment may include individual or group therapy, medicine, or both to address any social, physiological, and psychological factors that may play a role in the depression. Regular exercise, a healthy diet, rest, and social support may also be strongly recommended.  Postpartum psychosis is more serious and needs treatment right away. Hospitalization is often needed. HOME CARE INSTRUCTIONS  Get as much rest as you can. Nap when the baby sleeps.  Exercise regularly. Some women find yoga and walking to be beneficial.  Eat a balanced and nourishing diet.  Do little things that you enjoy. Have a cup of tea, take a bubble bath, read your favorite magazine, or listen to your favorite music.  Avoid alcohol.  Ask for help with household chores, cooking, grocery shopping, or running errands as needed. Do not try to do everything.  Talk to people close to you about how you are feeling. Get support from your partner, family members, friends, or other new moms.  Try to stay positive in how you think. Think about the things you are grateful for.  Do not spend a lot of time alone.  Only take medicine as directed by your caregiver.  Keep all your postpartum appointments.  Let your caregiver know if you have any concerns. SEEK MEDICAL CARE IF: You are having a reaction or problems with your medicine. SEEK IMMEDIATE MEDICAL CARE IF:  You have suicidal feelings.  You feel you may harm the baby or someone else. Document Released: 08/08/2004 Document Revised: 01/27/2012 Document Reviewed:  09/10/2011 [facility] Patient Information 2014 [place], [place].

## 2013-09-21 LAB — URINE CULTURE
Colony Count: NO GROWTH
Organism ID, Bacteria: NO GROWTH

## 2013-10-06 ENCOUNTER — Telehealth: Payer: Self-pay | Admitting: Advanced Practice Midwife

## 2013-10-06 MED ORDER — METOCLOPRAMIDE HCL 10 MG PO TABS: 10.0000 mg | ORAL_TABLET | Freq: Four times a day (QID) | ORAL | Status: DC

## 2013-10-06 NOTE — Telephone Encounter (Signed)
Has decrease in breast milk will try reglan 10 mg -34 x daily #40 with 1 refill, increase water and calories

## 2013-10-06 NOTE — Telephone Encounter (Signed)
Pt states would like medication to increase breastmilk supply. Is taking fenegreek, breast pumping.

## 2013-10-18 ENCOUNTER — Encounter: Payer: Self-pay | Admitting: *Deleted

## 2013-10-18 ENCOUNTER — Other Ambulatory Visit: Payer: BC Managed Care – PPO | Admitting: Women's Health

## 2014-06-11 ENCOUNTER — Emergency Department (HOSPITAL_COMMUNITY)
Admission: EM | Admit: 2014-06-11 | Discharge: 2014-06-11 | Disposition: A | Discharge status: Home / Self Care | Payer: BC Managed Care – PPO | Attending: Emergency Medicine | Admitting: Emergency Medicine

## 2014-06-11 ENCOUNTER — Other Ambulatory Visit: Payer: Self-pay

## 2014-06-11 ENCOUNTER — Emergency Department (HOSPITAL_COMMUNITY): Payer: BC Managed Care – PPO

## 2014-06-11 ENCOUNTER — Encounter (HOSPITAL_COMMUNITY): Payer: Self-pay | Admitting: Emergency Medicine

## 2014-06-11 DIAGNOSIS — B9789 Other viral agents as the cause of diseases classified elsewhere: Secondary | ICD-10-CM | POA: Insufficient documentation

## 2014-06-11 DIAGNOSIS — Z3202 Encounter for pregnancy test, result negative: Secondary | ICD-10-CM | POA: Insufficient documentation

## 2014-06-11 DIAGNOSIS — B349 Viral infection, unspecified: Secondary | ICD-10-CM

## 2014-06-11 DIAGNOSIS — I10 Essential (primary) hypertension: Secondary | ICD-10-CM | POA: Insufficient documentation

## 2014-06-11 DIAGNOSIS — R52 Pain, unspecified: Secondary | ICD-10-CM | POA: Insufficient documentation

## 2014-06-11 LAB — I-STAT CHEM 8, ED
BUN: 8 mg/dL (ref 6–23)
CALCIUM ION: 1.15 mmol/L (ref 1.12–1.23)
Chloride: 105 mEq/L (ref 96–112)
Creatinine, Ser: 0.7 mg/dL (ref 0.50–1.10)
Glucose, Bld: 90 mg/dL (ref 70–99)
HEMATOCRIT: 39 % (ref 36.0–46.0)
HEMOGLOBIN: 13.3 g/dL (ref 12.0–15.0)
Potassium: 3.9 mEq/L (ref 3.7–5.3)
SODIUM: 136 meq/L — AB (ref 137–147)
TCO2: 23 mmol/L (ref 0–100)

## 2014-06-11 LAB — URINALYSIS, ROUTINE W REFLEX MICROSCOPIC
Bilirubin Urine: NEGATIVE
GLUCOSE, UA: NEGATIVE mg/dL
KETONES UR: NEGATIVE mg/dL
LEUKOCYTES UA: NEGATIVE
NITRITE: NEGATIVE
PH: 6.5 (ref 5.0–8.0)
Protein, ur: NEGATIVE mg/dL
SPECIFIC GRAVITY, URINE: 1.025 (ref 1.005–1.030)
Urobilinogen, UA: 0.2 mg/dL (ref 0.0–1.0)

## 2014-06-11 LAB — PREGNANCY, URINE: PREG TEST UR: NEGATIVE

## 2014-06-11 LAB — URINE MICROSCOPIC-ADD ON

## 2014-06-11 LAB — RAPID STREP SCREEN (MED CTR MEBANE ONLY): STREPTOCOCCUS, GROUP A SCREEN (DIRECT): NEGATIVE

## 2014-06-11 LAB — I-STAT TROPONIN, ED: Troponin i, poc: 0 ng/mL (ref 0.00–0.08)

## 2014-06-11 MED ORDER — ACETAMINOPHEN 325 MG PO TABS
650.0000 mg | ORAL_TABLET | Freq: Once | ORAL | Status: AC
  Administered 2014-06-11: 650 mg via ORAL
  Filled 2014-06-11: qty 2

## 2014-06-11 MED ORDER — IBUPROFEN 400 MG PO TABS
400.0000 mg | ORAL_TABLET | Freq: Once | ORAL | Status: AC
  Administered 2014-06-11: 400 mg via ORAL
  Filled 2014-06-11: qty 1

## 2014-06-11 NOTE — ED Notes (Signed)
Pt c/o intermittent centralized chest pain that began this morning. Pt also reports chills, lightheadedness, diaphoresis and fever. Pt states "I feel like I have the flu but 10x worse". Pt denies cough.

## 2014-06-11 NOTE — ED Notes (Signed)
Discharge instructions given and reviewed with patient.  Patient verbalized understanding to follow up with PMD as needed and to take Motrin and Tylenol as needed.  Patient ambulatory; discharged home in good condition.

## 2014-06-11 NOTE — Discharge Instructions (Signed)
°Emergency Department Resource Guide °1) Find a Doctor and Pay Out of Pocket °Although you won't have to find out who is covered by your insurance plan, it is a good idea to ask around and get recommendations. You will then need to call the office and see if the doctor you have chosen will accept you as a new patient and what types of options they offer for patients who are self-pay. Some doctors offer discounts or will set up payment plans for their patients who do not have insurance, but you will need to ask so you aren't surprised when you get to your appointment. ° °2) Contact Your Local Health Department °Not all health departments have doctors that can see patients for sick visits, but many do, so it is worth a call to see if yours does. If you don't know where your local health department is, you can check in your phone book. The CDC also has a tool to help you locate your state's health department, and many state websites also have listings of all of their local health departments. ° °3) Find a Walk-in Clinic °If your illness is not likely to be very severe or complicated, you may want to try a walk in clinic. These are popping up all over the country in pharmacies, drugstores, and shopping centers. They're usually staffed by nurse practitioners or physician assistants that have been trained to treat common illnesses and complaints. They're usually fairly quick and inexpensive. However, if you have serious medical issues or chronic medical problems, these are probably not your best option. ° °No Primary Care Doctor: °- Call [facility] at  832-8000 - they can help you locate a primary care doctor that  accepts your insurance, provides certain services, etc. °- Physician Referral Service- 1-800-533-3463 ° °Chronic Pain Problems: °Organization         Address  Phone   Notes  °[facility]  (336) 297-2271 Patients need to be referred by their primary care doctor.  ° °Medication  Assistance: °Organization         Address  Phone   Notes  °[place] [facility] [address] °[place], [place] [address] (336) 641-8030 --Must be a resident of [place] °-- Must have NO insurance coverage whatsoever (no Medicaid/ Medicare, etc.) °-- The pt. MUST have a primary care doctor that directs their care regularly and follows them in the community °  °[facility]  (866) 331-1348   °[employer]  (888) 892-1162   ° °Agencies that provide inexpensive medical care: °Organization         Address  Phone   Notes  °[facility]  (336) 832-8035   °[facility]    (336) 832-7272   °[facility] [address] °[place], [place] [address] (336) 832-4777   °[facility] [address], °[place] (336) 271-4999   °[facility]    (336) 373-0678   °[facility]    (336) 272-1050   °[facility] ° [address], [place] Phone:  (336) 832-4444, Fax:  (336) 832-4440 Hours of Operation:  9 am - 6 pm, M-F.  Also accepts Medicaid/Medicare and self-pay.  °[facility] ° [address], [place] Phone: (336) [phone], Fax: (336) [phone]. Hours of Operation:  8:30 am - 5:30 pm, M-F.  Also accepts Medicaid and self-pay.  °[facility] [place] [address], [place] Phone: (336) 878-6027   °[facility] [address], [place], [place] (336)723-1848, Ext. 123 Mondays & Thursdays: 7-9 AM.  First 15 patients are seen on a first come, first serve basis. °  ° °Medicaid-accepting [place] Providers: ° °Organization         Address  Phone   Notes  °[facility] [address], [place] (336) 641-2100 Also accepts self-pay patients.  °[facility] [address], [place] ° (336) 856-9996   °[facility] [address], [place]  (336) 288-8857   °[facility] [address] [place] [address], [place] (336) 299-7000   °Veita Bland [address], [place]  ° (336) 373-1557 Only accepts [place] Access Medicaid patients after they have their name applied to their card.  ° °Self-Pay (no insurance) in [place]: ° °Organization         Address  Phone   Notes  °Sickle Cell Patients, [facility] [address], [place] (336) 832-1970   °[facility] [address], [place] (336) 832-4400   °[facility] ° [address], [place] (336) 992-4800   °[facility]/Dr. Osei-Bonsu ° [address] [place] [address], [place] or [address], [place] (336) [phone] Phone number for both [place] and [place] locations is the same.  °[facility] [address], [place] (336) 299-0000   °[facility] [address] [place] [address], [place] or [address] (336) 852-7530 °(336) 878-2260   °[facility] [address], [place] (336) 350-1642, phone; (336) 294-5005, fax Sees patients 1st and 3rd Saturday of every month.  Must not qualify for public or private insurance (i.e. Medicaid, Medicare, [place] Health Choice, Veterans' Benefits) • Household income should be no more than 200% of the poverty level •The clinic cannot treat you if you are pregnant or think you are pregnant • Sexually transmitted diseases are not treated at the clinic.  ° ° °Dental Care: °Organization         Address  Phone  Notes  °[place] [facility] [address], [place] (336) 641-6152 Accepts children up to age 21 who are enrolled in Medicaid or [place] Health Choice; pregnant women with a Medicaid card; and children who have applied for Medicaid or [place] Health Choice, but were declined, whose parents can pay a reduced fee at time of service.  °[place]  [facility] [place]  [address], [place] (336) 641-7733 Accepts children up to age 21 who are enrolled in Medicaid or [place] Health Choice; pregnant women with a Medicaid card; and children who have applied for Medicaid or [place] Health Choice, but were declined, whose parents can pay a reduced fee at time of service.  °[facility] ° [address], [place] (336) 641-4533 Patients are seen by appointment only. Walk-ins are not accepted. [facility] will see patients 18 years of age and older. °Monday - Tuesday (8am-5pm) °Most Wednesdays (8:30-5pm) °$30 per visit, cash only  °[facility] ° [address], [place] (336) 641-4533 Patients are seen by appointment only. Walk-ins are not accepted. [facility] will see patients 18 years of age and older. °One   Wednesday Evening (Monthly: Volunteer Based).  $30 per visit, cash only  °[facility]  (919) 537-3737 for adults; Children under age 4, call Graduate Pediatric Dentistry at ([phone]) [phone]. Children aged 4-14, please call (919) [phone] to request a pediatric application. ° Dental services are provided in all areas of dental care including fillings, crowns and bridges, complete and partial dentures, implants, gum treatment, root canals, and extractions. Preventive care is also provided. Treatment is provided to both adults and children. °Patients are selected via a lottery and there is often a waiting list. °  °[facility] [address], °[place] ° (336) 763-8833 [email or web address] °  °[facility] [address], [place], [place] (336)723-1848, Ext. 123 Second and Fourth Thursday of each month, opens at 6:30 AM; Clinic ends at 9 AM.  Patients are seen on a first-come first-served basis, and a limited number are seen during each clinic.  ° °[facility] ° [address], [place], [place] (336) 723-7904    Eligibility Requirements °You must have lived in [place], or [place] counties for at least the last three months. °  You cannot be eligible for state or federal sponsored healthcare insurance, including Veterans Administration, Medicaid, or Medicare. °  You generally cannot be eligible for healthcare insurance through your employer.  °  How to apply: °Eligibility screenings are held every Tuesday and Wednesday afternoon from 1:00 pm until 4:00 pm. You do not need an appointment for the interview!  °[facility] [address], [place], [place] 336-631-2330   °[facility]  336-342-8273   °[place] [facility]  336-703-3100   °[facility]  336-570-6415   ° °Behavioral Health Resources in the Community: °Intensive Outpatient Programs °Organization         Address  Phone  Notes  °[facility] [address], [place], [place] 336-878-6098   °[facility] [address], [place], [place] 336-832-9800   °[facility] [address], [place], [place] ° 336-882-2125   °[facility] [address],  °[place], [place] 1-800-853-5163 or 336-641-4981   °Substance Abuse Resources °Organization         Address  Phone  Notes  °[facility]  336-882-2125   °[facility]  336-784-9470   °[facility]  336-285-9073   °[facility]  336-845-3988   °Residential & Outpatient Substance Abuse Program  1-800-659-3381   °Psychological Services °Organization         Address  Phone  Notes  °[facility]  336- 832-9600   °[facility]  336- 378-7881   °[facility] [address], [place] 1-800-853-5163 or 336-641-4981   ° °Mobile Crisis Teams °Organization         Address  Phone  Notes  °[facility], Mobile Crisis Care Unit  1-877-626-1772   °Assertive °[facility] ° [address]  [place], [place] 336-834-9664   °Sharon DeEsch [address] °[place] 336-554-5454   ° °Self-Help/Support Groups °Organization         Address  Phone             Notes  °[facility] of  - variety of support groups  336- 373-1402 Call for more information  °Narcotics Anonymous (NA), [facility] [address], °[place]  2 meetings at this location  ° °  Residential Treatment Programs Organization         Address  Phone  Notes  [facility] [address],    [place]  [phone])[phone]   [facility]  [address], [place] [phone], [place], [place] [phone]   [facility] [address], [place] [phone] Admissions: 8am-3pm M-F  [facility] [address]    [place], [place] [phone]   [facility] [address], [place], [place] [phone]   [facility] [address]  [place], [place] [phone]   [facility] Intensive Outpatient [address], [place], [place] [phone]   [facility] ([facility] [address]  [place], [place] [phone]) [phone] or [phone]   [facility] ([facility] [address] [place], [place] [phone] Accepts Medicaid  [facility] [address]  [place] [phone] Substance Abuse/Addiction Treatment   [facility]         Address  Phone  Notes  [facility]  (743)237-2705(888) (585)[phone]   Angie FavaJulie Brannon, PhD [address] [place], [place]   [phone](336) [phone] or [phone]) [phone]   [facility]   [address] [place], [place] [phone]) [phone]   [facility] [address], [place], [place] [phone](336) [phone] Insurance/Medicaid/sponsorship through [facility]  [facility] [address]                                    [place], [place] [phone](336) [phone] Therapy/tele-psych/case    [facility] [address]   [place], [place] [phone]    Dr. Lolly MustacheArfeen  [phone]   [facility]  [employer] [facility] 1) [address], [place] 2) [address], [place] 3)  [address], [place] [phone](336) [phone]) [phone]  [phone](336) [phone]   [facility] Child Abuse Hotline [phone](336) [phone] or [phone](336) [phone] (After Hours)       Take over the counter tylenol and ibuprofen, as directed on packaging, as needed for discomfort.  Call your regular medical doctor on Monday to schedule a follow up appointment within the next 2 days.  Return to the Emergency Department immediately sooner if worsening.

## 2014-06-11 NOTE — ED Provider Notes (Signed)
CSN: [id number]     Arrival date & time 06/11/14  1654 History   First MD Initiated Contact with Patient 06/11/14 1742     Chief Complaint  Patient presents with  . Chills  . Generalized Body Aches  . Fatigue      HPI Pt was seen at 1745.  Per pt, c/o gradual onset and persistence of constant chills, generalized body aches and fatigue since last night. States her chest started to "hurt" this morning, so she came to the ED for evaluation. Pt states over the past week she has been "exposed to strep" by one of her children, and another child was evaluated yesterday by his PMD and dx "virus." Denies fevers, no rash, no palpitations, no SOB, no N/V/D, no abd pain, no calf/LE pain or unilateral swelling.     Past Medical History  Diagnosis Date  . Hypertension    Past Surgical History  Procedure Laterality Date  . Cesarean section    . Cesarean section with bilateral tubal ligation Bilateral 08/05/2013    Procedure: REPEAT CESAREAN SECTION WITH BILATERAL TUBAL LIGATION;  Surgeon: Allie Bossier, MD;  Location: [facility];  Service: Obstetrics;  Laterality: Bilateral;   Family History  Problem Relation Age of Onset  . Hypertension Mother   . Cancer Mother     skin  . Cancer Maternal Grandmother   . Hypertension Maternal Grandmother   . Cancer Other     breast   History  Substance Use Topics  . Smoking status: Never Smoker   . Smokeless tobacco: Never Used  . Alcohol Use: No   OB History   Grav Para Term Preterm Abortions TAB SAB Ect Mult Living   2 2 2       2      Review of Systems ROS: Statement: All systems negative except as marked or noted in the HPI; Constitutional: Negative for fever and +chills, generalized body aches/fatigue. ; ; Eyes: Negative for eye pain, redness and discharge. ; ; ENMT: +"exposed to strep." Negative for ear pain, hoarseness, nasal congestion, sinus pressure and sore throat. ; ; Cardiovascular: Negative for palpitations, diaphoresis, dyspnea and peripheral  edema. ; ; Respiratory: Negative for cough, wheezing and stridor. ; ; Gastrointestinal: Negative for nausea, vomiting, diarrhea, abdominal pain, blood in stool, hematemesis, jaundice and rectal bleeding. . ; ; Genitourinary: Negative for dysuria, flank pain and hematuria. ; ; Musculoskeletal: Negative for back pain and neck pain. Negative for swelling and trauma.; ; Skin: Negative for pruritus, rash, abrasions, blisters, bruising and skin lesion.; ; Neuro: Negative for headache, lightheadedness and neck stiffness. Negative for weakness, altered level of consciousness , altered mental status, extremity weakness, paresthesias, involuntary movement, seizure and syncope.      Allergies  Review of patient's allergies indicates no known allergies.  Home Medications   Prior to Admission medications   Medication Sig Start Date End Date Taking? Authorizing Provider  GuaiFENesin (MUCINEX PO) Take 1 tablet by mouth once as needed (for congestion/cold symptoms).   Yes Historical Provider, MD  ibuprofen (ADVIL,MOTRIN) 200 MG tablet Take 800 mg by mouth daily as needed for mild pain or moderate pain.   Yes Historical Provider, MD  metoCLOPramide (REGLAN) 10 MG tablet Take 10 mg by mouth daily as needed. Takes to increase breat milk production   Yes Historical Provider, MD   BP 124/88  Pulse 99  Temp(Src) 99.3 F (37.4 C) (Oral)  Resp 18  SpO2 99%  LMP 06/09/2014 Physical Exam 1450: Physical  examination:  Nursing notes reviewed; Vital signs and O2 SAT reviewed;  Constitutional: Well developed, Well nourished, Well hydrated, In no acute distress; Head:  Normocephalic, atraumatic; Eyes: EOMI, PERRL, No scleral icterus; ENMT: TM's clear bilat. +edemetous nasal turbinates bilat with clear rhinorrhea. +erythema to posterior pharynx. Mouth and pharynx without lesions. No tonsillar exudates. No intra-oral edema. No submandibular or sublingual edema. No hoarse voice, no drooling, no stridor. No pain with  manipulation of larynx. No trismus. Mouth and pharynx normal, Mucous membranes moist; Neck: Supple, Full range of motion, No lymphadenopathy. No meningeal signs.; Cardiovascular: Regular rate and rhythm, No murmur, rub, or gallop; Respiratory: Breath sounds clear & equal bilaterally, No rales, rhonchi, wheezes.  Speaking full sentences with ease, Normal respiratory effort/excursion; Chest: Nontender, Movement normal; Abdomen: Soft, Nontender, Nondistended, Normal bowel sounds; Genitourinary: No CVA tenderness; Extremities: Pulses normal, No tenderness, No edema, No calf edema or asymmetry.; Neuro: AA&Ox3, Major CN grossly intact.  Speech clear. No gross focal motor or sensory deficits in extremities. Climbs on and off stretcher easily by herself. Gait steady.; Skin: Color normal, Warm, Dry.   ED Course  Procedures     EKG Interpretation   Date/Time:  Saturday June 11 2014 17:07:17 EDT Ventricular Rate:  101 PR Interval:  124 QRS Duration: 84 QT Interval:  336 QTC Calculation: 435 R Axis:   57 Text Interpretation:  Sinus tachycardia Nonspecific T wave abnormality  Abnormal ECG Confirmed by Adriana SimasOOK  MD, BRIAN ([id number]) on 06/11/2014 5:11:51 PM      MDM  MDM Reviewed: previous chart, nursing note and vitals Interpretation: x-ray, labs and ECG     Results for orders placed during the hospital encounter of 06/11/14  RAPID STREP SCREEN      Result Value Ref Range   Streptococcus, Group A Screen (Direct) NEGATIVE  NEGATIVE  URINALYSIS, ROUTINE W REFLEX MICROSCOPIC      Result Value Ref Range   Color, Urine YELLOW  YELLOW   APPearance CLEAR  CLEAR   Specific Gravity, Urine 1.025  1.005 - 1.030   pH 6.5  5.0 - 8.0   Glucose, UA NEGATIVE  NEGATIVE mg/dL   Hgb urine dipstick MODERATE (*) NEGATIVE   Bilirubin Urine NEGATIVE  NEGATIVE   Ketones, ur NEGATIVE  NEGATIVE mg/dL   Protein, ur NEGATIVE  NEGATIVE mg/dL   Urobilinogen, UA 0.2  0.0 - 1.0 mg/dL   Nitrite NEGATIVE  NEGATIVE    Leukocytes, UA NEGATIVE  NEGATIVE  PREGNANCY, URINE      Result Value Ref Range   Preg Test, Ur NEGATIVE  NEGATIVE  URINE MICROSCOPIC-ADD ON      Result Value Ref Range   WBC, UA 0-2  <3 WBC/hpf   RBC / HPF 3-6  <3 RBC/hpf  I-STAT TROPOININ, ED      Result Value Ref Range   Troponin i, poc 0.00  0.00 - 0.08 ng/mL   Comment 3           I-STAT CHEM 8, ED      Result Value Ref Range   Sodium 136 (*) 137 - 147 mEq/L   Potassium 3.9  3.7 - 5.3 mEq/L   Chloride 105  96 - 112 mEq/L   BUN 8  6 - 23 mg/dL   Creatinine, Ser 6.040.70  0.50 - 1.10 mg/dL   Glucose, Bld 90  70 - 99 mg/dL   Calcium, Ion 5.401.15  9.811.12 - 1.23 mmol/L   TCO2 23  0 - 100 mmol/L  Hemoglobin 13.3  12.0 - 15.0 g/dL   HCT 16.1  09.6 - 04.5 %   Dg Chest 2 View 06/11/2014   CLINICAL DATA:  28 year old female with cough, weakness fatigue and chills.  EXAM: CHEST  2 VIEW  COMPARISON:  12/04/2012  FINDINGS: The cardiomediastinal silhouette is unremarkable.  There is no evidence of focal airspace disease, pulmonary edema, suspicious pulmonary nodule/mass, pleural effusion, or pneumothorax. No acute bony abnormalities are identified.  IMPRESSION: No active cardiopulmonary disease.   Electronically Signed   By: Laveda Abbe M.D.   On: 06/11/2014 19:03    1920:  Workup reassuring. PERC negative, TIMI 0. Pt has tol PO well while in the ED without N/V. Pt currently sitting up on stretcher, feeding her child food. Appears comfortable, NAD. Pt states she wants to go home now. Will tx symptomatically at this time. Dx and testing d/w pt.  Questions answered.  Verb understanding, agreeable to d/c home with outpt f/u.   Laray Anger, DO 06/13/14 2027

## 2014-06-14 LAB — CULTURE, GROUP A STREP

## 2014-08-09 ENCOUNTER — Emergency Department: Payer: Self-pay | Admitting: Emergency Medicine

## 2014-08-09 LAB — CBC
HCT: 40 % (ref 35.0–47.0)
HGB: 13.4 g/dL (ref 12.0–16.0)
MCH: 29.3 pg (ref 26.0–34.0)
MCHC: 33.4 g/dL (ref 32.0–36.0)
MCV: 88 fL (ref 80–100)
PLATELETS: 292 10*3/uL (ref 150–440)
RBC: 4.56 10*6/uL (ref 3.80–5.20)
RDW: 13.3 % (ref 11.5–14.5)
WBC: 12.6 10*3/uL — AB (ref 3.6–11.0)

## 2014-08-09 LAB — URINALYSIS, COMPLETE
BLOOD: NEGATIVE
Bilirubin,UR: NEGATIVE
Glucose,UR: NEGATIVE mg/dL (ref 0–75)
KETONE: NEGATIVE
LEUKOCYTE ESTERASE: NEGATIVE
NITRITE: NEGATIVE
PROTEIN: NEGATIVE
Ph: 6 (ref 4.5–8.0)
RBC,UR: 1 /HPF (ref 0–5)
SPECIFIC GRAVITY: 1.026 (ref 1.003–1.030)
Squamous Epithelial: 1
WBC UR: <1 /HPF (ref 0–5)

## 2014-08-09 LAB — GC/CHLAMYDIA PROBE AMP

## 2014-08-09 LAB — COMPREHENSIVE METABOLIC PANEL
ALT: 21 U/L
ANION GAP: 3 — AB (ref 7–16)
Albumin: 3.5 g/dL (ref 3.4–5.0)
Alkaline Phosphatase: 84 U/L
BUN: 15 mg/dL (ref 7–18)
Bilirubin,Total: 0.1 mg/dL — ABNORMAL LOW (ref 0.2–1.0)
CO2: 30 mmol/L (ref 21–32)
CREATININE: 0.77 mg/dL (ref 0.60–1.30)
Calcium, Total: 8.2 mg/dL — ABNORMAL LOW (ref 8.5–10.1)
Chloride: 106 mmol/L (ref 98–107)
EGFR (African American): >60
EGFR (Non-African Amer.): >60
GLUCOSE: 86 mg/dL (ref 65–99)
Osmolality: 278 (ref 275–301)
Potassium: 4.7 mmol/L (ref 3.5–5.1)
SGOT(AST): 19 U/L (ref 15–37)
Sodium: 139 mmol/L (ref 136–145)
Total Protein: 7.2 g/dL (ref 6.4–8.2)

## 2014-08-09 LAB — WET PREP, GENITAL

## 2014-08-29 IMAGING — CR DG KNEE COMPLETE 4+V*L*
4 series · 4 of 4 positions shown · non-contrast
Comparison: None.

CLINICAL DATA: Medial knee pain.

LEFT KNEE - COMPLETE 4+ VIEW

[t knee ap left]
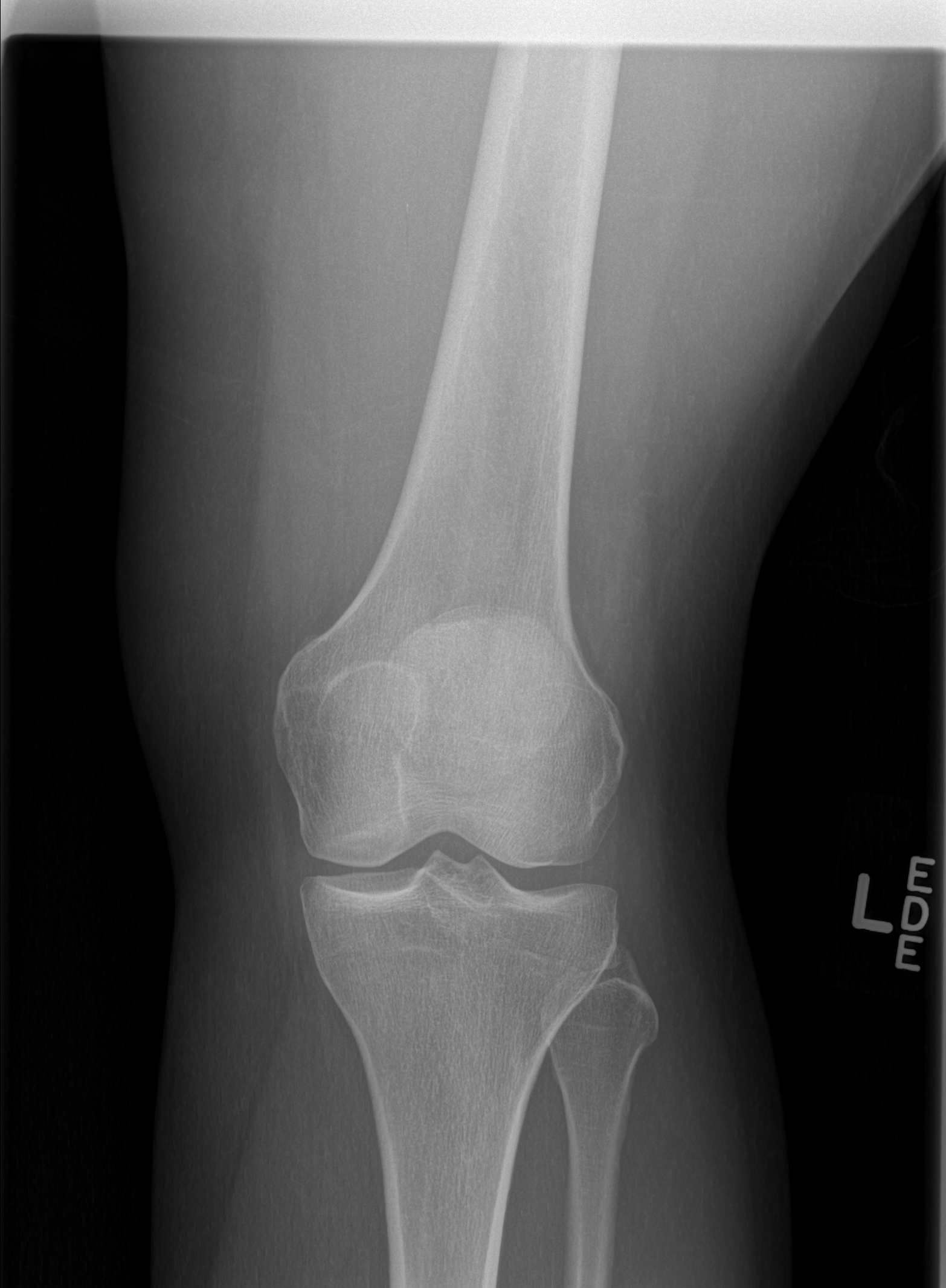

[t knee oblique left (1 of 2)]
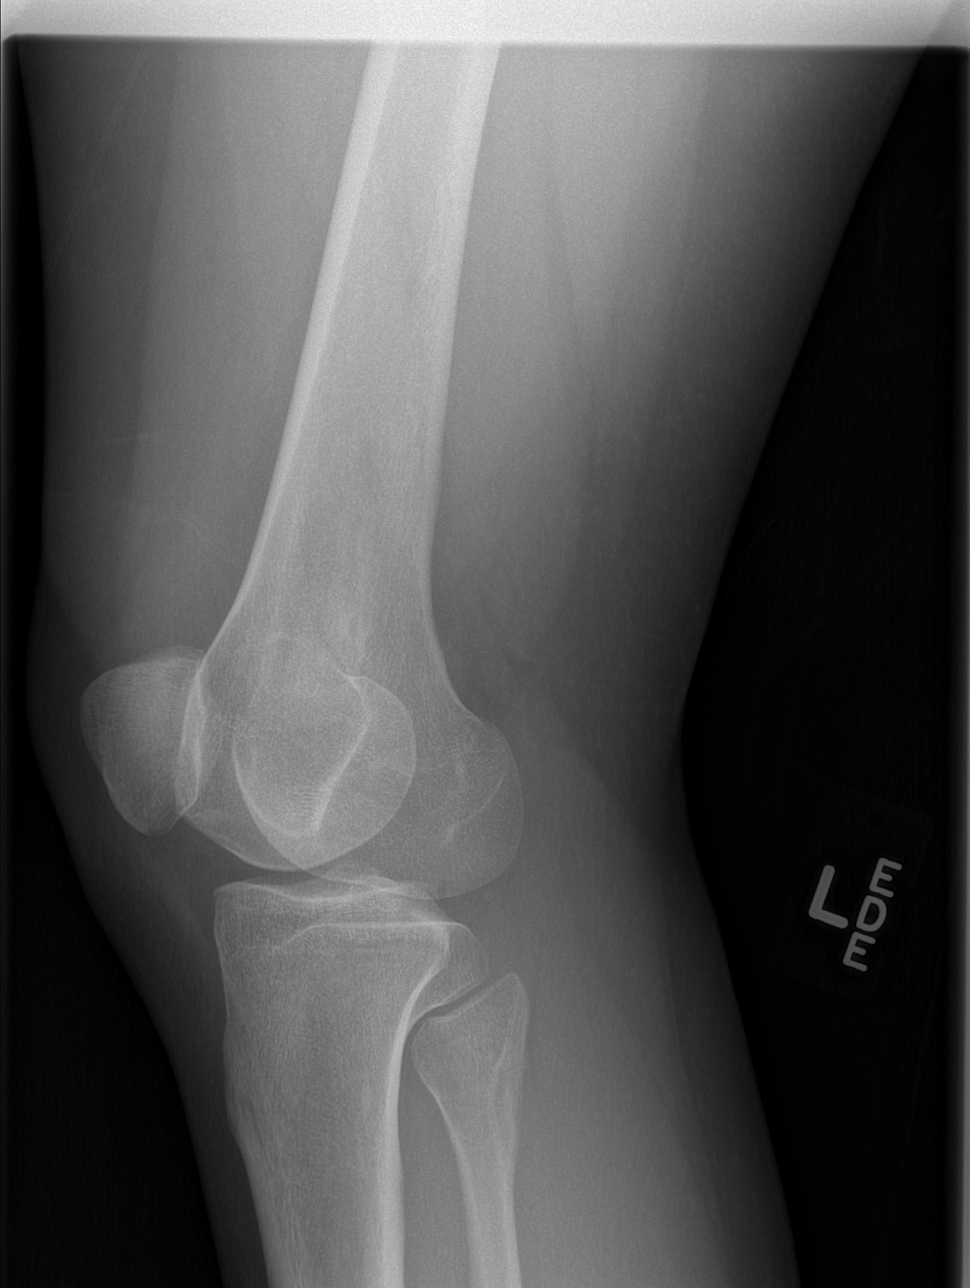

[t knee oblique left (2 of 2)]
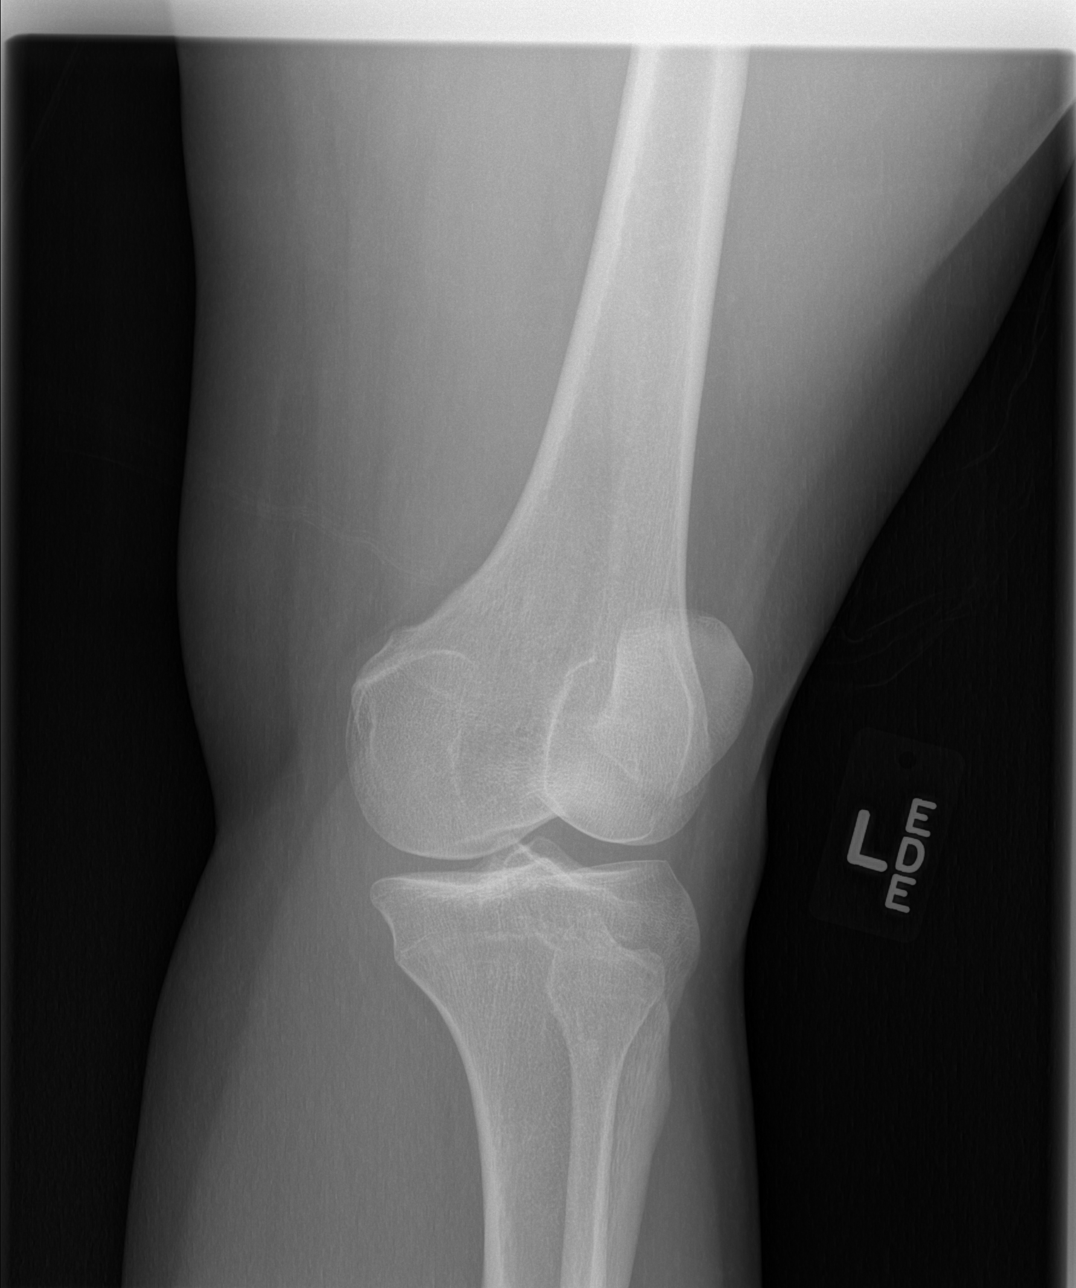

[t knee lat left]
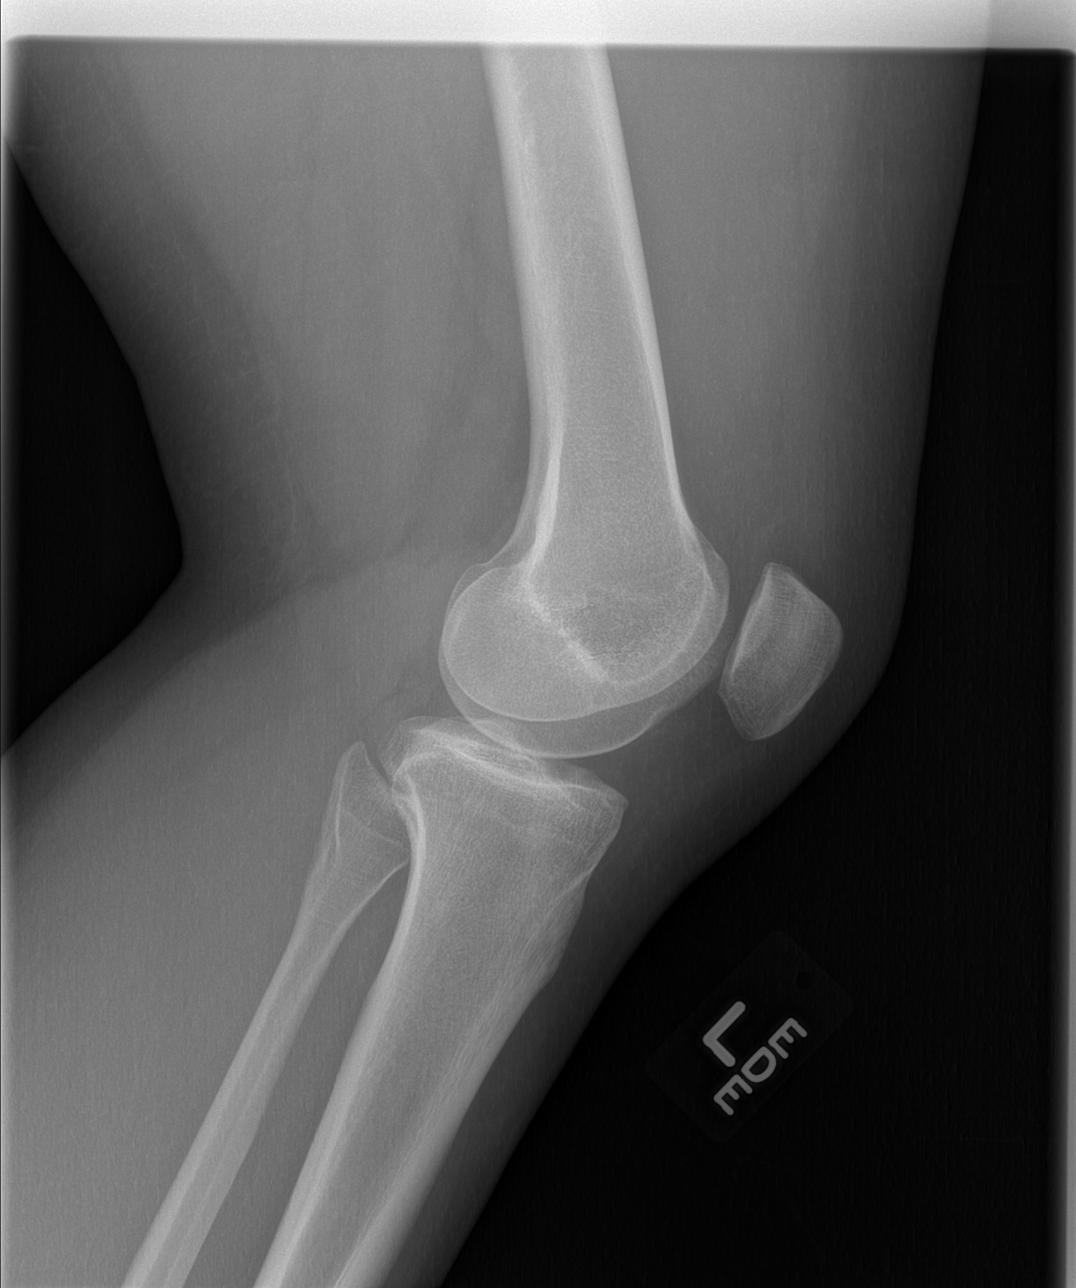

[4 of 4 positions shown; findings below may reference images not displayed]

FINDINGS: Four views of the left knee are negative for fracture or
dislocation.  There is normal alignment.  No evidence for a
suprapatellar joint effusion.
IMPRESSION: No acute bony abnormality.

## 2014-09-18 HISTORY — PX: ENDOMETRIAL ABLATION W/ NOVASURE: SUR434

## 2014-09-19 ENCOUNTER — Encounter (HOSPITAL_COMMUNITY): Payer: Self-pay | Admitting: Emergency Medicine

## 2014-10-17 ENCOUNTER — Ambulatory Visit: Payer: Self-pay | Admitting: Obstetrics and Gynecology

## 2014-12-20 ENCOUNTER — Ambulatory Visit: Payer: Self-pay | Admitting: Obstetrics and Gynecology

## 2015-03-13 LAB — SURGICAL PATHOLOGY

## 2015-03-15 NOTE — Op Note (Signed)
PATIENT NAME:  Sarah Eaton, Sarah Eaton DATE OF BIRTH:  03/14/1986  DATE OF PROCEDURE:  10/17/2014  PREOPERATIVE DIAGNOSIS:  Menorrhagia.  POSTOPERATIVE DIAGNOSIS:  Menorrhagia.  PROCEDURE: Hysteroscopy, dilatation and curettage with NovaSure endometrial ablation.   ANESTHESIA: General.   SURGEON: Samina Weekes S. Valentino Saxonherry, MD  ESTIMATED BLOOD LOSS: 25 mL.  OPERATIVE FLUIDS: 500 mL.   URINE OUTPUT: 100 mL.   COMPLICATIONS: None.   FINDINGS: Mobile uterus sounded to 8 cm, cervical length was 3 cm, cavity width was 4 cm. There was mildly proliferative endometrium appreciated. No endometrial masses noted. Both tubal ostia were identified.   SPECIMEN: Endometrial curettings.   CONDITION: The patient is stable.   PROCEDURE DETAILS: The patient was taken to the operating room where she was placed under general anesthesia without difficulty. She was then placed in the dorsal lithotomy position using Allen stirrups, and prepped and draped in normal sterile fashion. A timeout was performed. Next, a straight catheterization was performed with return of 100 mL of clear urine. A bivalve speculum was then inserted into the patient's vagina. The anterior lip of the cervix was then grasped with a single-tooth tenaculum. The uterus sounded to 8 cm and the cervix was gradually dilated using Pratt dilators. The hysteroscope was then primed and inserted into the uterine cavity with the above findings noted. After survey of the uterine cavity, the hysteroscope was then removed from the uterine cavity and a sharp curettage was performed until a gritty texture was noted. Next, the NovaSure device was primed and inserted into the patient's uterine cavity with the above noted measurements. The device was then activated for a total of 66 seconds with a power of 110 watts. Next, the NovaSure device was then removed from the uterine cavity. The hysteroscope was then readvanced into the uterine cavity where adequate  charring was noted. The hysteroscope was then removed from the patient's uterine cavity. After this, the tenaculum was removed from the anterior lip of the cervix. There was some bleeding noted that was not controlled with pressure alone, so a silver nitrate stick was placed on the area of oozing with good hemostasis achieved. The speculum was removed from the patient's vagina. The patient was awakened and taken to the PACU in stable condition.     ____________________________ Jacques EarthlyAnika S. Valentino Saxonherry, MD asc:LT D: 10/17/2014 14:19:51 ET T: 10/17/2014 16:07:29 ET JOB#: [id number]  cc: Jacques EarthlyAnika S. Valentino Saxonherry, MD, <Dictator> Fabian NovemberANIKA S Danny Zimny MD ELECTRONICALLY SIGNED 11/18/2014 9:48

## 2015-04-08 ENCOUNTER — Encounter (HOSPITAL_COMMUNITY): Payer: Self-pay | Admitting: *Deleted

## 2015-04-08 ENCOUNTER — Emergency Department (HOSPITAL_COMMUNITY)
Admission: EM | Admit: 2015-04-08 | Discharge: 2015-04-08 | Disposition: A | Discharge status: Home / Self Care | Payer: BLUE CROSS/BLUE SHIELD | Attending: Emergency Medicine | Admitting: Emergency Medicine

## 2015-04-08 ENCOUNTER — Emergency Department (HOSPITAL_COMMUNITY): Payer: BLUE CROSS/BLUE SHIELD

## 2015-04-08 DIAGNOSIS — I10 Essential (primary) hypertension: Secondary | ICD-10-CM | POA: Diagnosis not present

## 2015-04-08 DIAGNOSIS — Z79899 Other long term (current) drug therapy: Secondary | ICD-10-CM | POA: Insufficient documentation

## 2015-04-08 DIAGNOSIS — M25562 Pain in left knee: Secondary | ICD-10-CM | POA: Diagnosis present

## 2015-04-08 NOTE — ED Notes (Signed)
Pt states pain to left knee x ~3467mo. Also states that the knee feels, weak, "like it's going to give out" at times. No known injury.

## 2015-04-08 NOTE — ED Provider Notes (Signed)
CSN: [id number]     Arrival date & time 04/08/15  1117 History   First MD Initiated Contact with Patient 04/08/15 1212     Chief Complaint  Patient presents with  . Knee Pain     (Consider location/radiation/quality/duration/timing/severity/associated sxs/prior Treatment) Patient is a 29 y.o. female presenting with knee pain.  Knee Pain Location:  Knee Time since incident:  1 month Injury: no   Knee location:  L knee Pain details:    Quality:  Aching   Radiates to:  Does not radiate   Severity:  Moderate   Onset quality:  Gradual   Progression:  Worsening Chronicity:  New Dislocation: no   Foreign body present:  No foreign bodies Prior injury to area:  No Relieved by:  Nothing Worsened by:  Activity and bearing weight Ineffective treatments:  None tried  Sarah Eaton is a 29 y.o. female who presents to the ED with left knee pain that has been off and on for the past month. She reports that sometimes she feels like the knee is going to give out on her. She does not remember any injury. She does have 2 children that she is constantly running after.   Past Medical History  Diagnosis Date  . Hypertension    Past Surgical History  Procedure Laterality Date  . Cesarean section    . Cesarean section with bilateral tubal ligation Bilateral 08/05/2013    Procedure: REPEAT CESAREAN SECTION WITH BILATERAL TUBAL LIGATION;  Surgeon: Allie Bossier, MD;  Location: [facility];  Service: Obstetrics;  Laterality: Bilateral;   Family History  Problem Relation Age of Onset  . Hypertension Mother   . Cancer Mother     skin  . Cancer Maternal Grandmother   . Hypertension Maternal Grandmother   . Cancer Other     breast   History  Substance Use Topics  . Smoking status: Never Smoker   . Smokeless tobacco: Never Used  . Alcohol Use: No   OB History    Gravida Para Term Preterm AB TAB SAB Ectopic Multiple Living   Review of Systems Negative except as stated in  HPI   Allergies  Review of patient's allergies indicates no known allergies.  Home Medications   Prior to Admission medications   Medication Sig Start Date End Date Taking? Authorizing Provider  ibuprofen (ADVIL,MOTRIN) 200 MG tablet Take 800 mg by mouth daily as needed for mild pain or moderate pain.   Yes Historical Provider, MD  phentermine (ADIPEX-P) 37.5 MG tablet Take 37.5 mg by mouth daily. 03/26/15  Yes Historical Provider, MD  rizatriptan (MAXALT) 10 MG tablet Take 10 mg by mouth as needed for migraine. May repeat in 2 hours if needed   Yes Historical Provider, MD  topiramate (TOPAMAX) 100 MG tablet Take 100 mg by mouth 2 (two) times daily. 03/26/15  Yes Historical Provider, MD   BP 115/84 mmHg  Pulse 99  Temp(Src) 98.4 F (36.9 C) (Oral)  Resp 16  Ht  (1.575 m)  Wt 176 lb (79.833 kg)  BMI 32.18 kg/m2  SpO2 100%  LMP 03/19/2015 Physical Exam  Constitutional: She is oriented to person, place, and time. She appears well-developed and well-nourished. No distress.  Patient sitting on exam bed with both knees bent up under her. She appears comfortable.   HENT:  Head: Normocephalic.  Eyes: EOM are normal.  Neck: Neck supple.  Cardiovascular:  Normal rate.   Pulmonary/Chest: Effort normal.  Musculoskeletal: Normal range of motion.       Left knee: She exhibits normal range of motion, no swelling, no ecchymosis, no deformity, no laceration, no erythema, normal alignment and no LCL laxity. Tenderness found.       Legs: Full range of motion of the knee without pain. There is mild tenderness with palpation of the anterior aspect of the left knee.   Neurological: She is alert and oriented to person, place, and time. No cranial nerve deficit.  Skin: Skin is warm and dry.  Psychiatric: She has a normal mood and affect. Her behavior is normal.  Nursing note and vitals reviewed.   ED Course  Procedures (including critical care time) Labs Review Labs Reviewed - No data to  display  Imaging Review Dg Knee Complete 4 Views Left  04/08/2015   CLINICAL DATA:  One month history of generalized left knee pain. No known injuries.  EXAM: LEFT KNEE - COMPLETE 4+ VIEW  COMPARISON:  12/04/2012.  FINDINGS: Images were obtained with patient weight-bearing. No evidence of acute fracture or dislocation. Well-preserved joint spaces. Well-preserved bone mineral density. No intrinsic osseous abnormality. No visible joint effusion.  IMPRESSION: Normal examination.   Electronically Signed   By: Hulan Saashomas  Lawrence M.D.   On: 04/08/2015 12:06     MDM  29 y.o. female with left knee pain off and on x 1 month. Stable for d/c without neurovascular compromise. Placed in knee immobilizer and she will take ibuprofen and follow up with ortho. Discussed with the patient and all questioned fully answered. She will return if any problems arise.  Final diagnoses:  Left knee pain      Janne NapoleonHope M Charmian Forbis, NP 04/09/15 1734  Bethann BerkshireJoseph Zammit, MD 04/09/15 2330

## 2015-04-21 ENCOUNTER — Ambulatory Visit (INDEPENDENT_AMBULATORY_CARE_PROVIDER_SITE_OTHER): Payer: BLUE CROSS/BLUE SHIELD

## 2015-04-21 VITALS — BP 132/88 | HR 102 | Ht 64.0 in | Wt 173.1 lb

## 2015-04-21 DIAGNOSIS — E669 Obesity, unspecified: Secondary | ICD-10-CM

## 2015-04-21 MED ORDER — CYANOCOBALAMIN 1000 MCG/ML IJ SOLN
1000.0000 ug | Freq: Once | INTRAMUSCULAR | Status: AC
  Administered 2015-04-21: 1000 ug via INTRAMUSCULAR

## 2015-04-21 NOTE — Progress Notes (Signed)
Patient ID: Sarah Eaton, female   DOB: February 12, 1986, 29 y.o.   MRN: [id number] Pt. Here for weight ck., B/P ck, and B12 injection.  Weight loss of 5 lbs., since last visit-03/21/2015. No c/o side effects of phentermine. Pt. Has 2wks. Of phentermine left. Pulse 102 and pt. Has 4 small children with her.

## 2015-05-15 ENCOUNTER — Other Ambulatory Visit (INDEPENDENT_AMBULATORY_CARE_PROVIDER_SITE_OTHER): Payer: BLUE CROSS/BLUE SHIELD

## 2015-05-15 DIAGNOSIS — R300 Dysuria: Secondary | ICD-10-CM | POA: Diagnosis not present

## 2015-05-15 LAB — POCT URINALYSIS DIPSTICK
Bilirubin, UA: NEGATIVE
Blood, UA: NEGATIVE
Glucose, UA: NEGATIVE
Ketones, UA: NEGATIVE
Nitrite, UA: NEGATIVE
PH UA: 8
Spec Grav, UA: 1.01
Urobilinogen, UA: 0.2

## 2015-05-15 NOTE — Progress Notes (Cosign Needed)
Pt dropped off urine, c/o burning with urination

## 2015-05-18 LAB — URINE CULTURE

## 2015-05-23 ENCOUNTER — Ambulatory Visit (INDEPENDENT_AMBULATORY_CARE_PROVIDER_SITE_OTHER): Payer: BLUE CROSS/BLUE SHIELD | Admitting: *Deleted

## 2015-05-23 VITALS — BP 134/84 | HR 96 | Ht 64.0 in | Wt 135.7 lb

## 2015-05-23 DIAGNOSIS — E669 Obesity, unspecified: Secondary | ICD-10-CM | POA: Diagnosis not present

## 2015-05-23 MED ORDER — CYANOCOBALAMIN 1000 MCG/ML IJ SOLN
1000.0000 ug | Freq: Once | INTRAMUSCULAR | Status: AC
  Administered 2015-05-23: 1000 ug via INTRAMUSCULAR

## 2015-05-23 NOTE — Progress Notes (Cosign Needed)
Pt is here for wt, bp check, b-12 inj Pt has done really well with her weight loss, is very happy with her weight

## 2015-06-07 ENCOUNTER — Ambulatory Visit: Payer: BLUE CROSS/BLUE SHIELD | Admitting: Obstetrics and Gynecology

## 2015-06-07 ENCOUNTER — Encounter: Payer: Self-pay | Admitting: Obstetrics and Gynecology

## 2015-06-07 ENCOUNTER — Ambulatory Visit (INDEPENDENT_AMBULATORY_CARE_PROVIDER_SITE_OTHER): Payer: BLUE CROSS/BLUE SHIELD | Admitting: Obstetrics and Gynecology

## 2015-06-07 VITALS — BP 119/89 | HR 94 | Ht 63.0 in | Wt 166.0 lb

## 2015-06-07 DIAGNOSIS — Z79899 Other long term (current) drug therapy: Secondary | ICD-10-CM

## 2015-06-07 DIAGNOSIS — E663 Overweight: Secondary | ICD-10-CM | POA: Diagnosis not present

## 2015-06-07 MED ORDER — CYANOCOBALAMIN 1000 MCG/ML IJ KIT: 1000.0000 mg | PACK | INTRAMUSCULAR | Status: DC

## 2015-06-07 MED ORDER — PHENTERMINE HCL 37.5 MG PO TABS: 37.5000 mg | ORAL_TABLET | Freq: Every day | ORAL | Status: DC

## 2015-06-07 NOTE — Progress Notes (Signed)
Subjective:     Patient ID: Alease MedinaJulie C Brauner, female   DOB: October 15, 1986, 29 y.o.   MRN: [id number]  HPI Been on weight loss medications for 3 months with >30# weight loss, desires to continue to lose the rest of weight needed to return to normal BMI  Review of Systems Doing well w/o complaint    Objective:   Physical Exam A&O X4 Well groomed, overweight female in no distress PE not indicated. VSS, BMI 29    Assessment:     Weight loss management Overweight     Plan:     refll adipex and B12 Restart medication after 2 weeks, RTC in 4 weeks  Melody BiggsvilleBurr, CNM

## 2015-07-07 ENCOUNTER — Ambulatory Visit (INDEPENDENT_AMBULATORY_CARE_PROVIDER_SITE_OTHER): Payer: BLUE CROSS/BLUE SHIELD | Admitting: Obstetrics and Gynecology

## 2015-07-07 VITALS — BP 129/88 | HR 72 | Ht 63.0 in | Wt 171.3 lb

## 2015-07-07 DIAGNOSIS — E669 Obesity, unspecified: Secondary | ICD-10-CM | POA: Diagnosis not present

## 2015-07-07 MED ORDER — CYANOCOBALAMIN 1000 MCG/ML IJ SOLN
1000.0000 ug | Freq: Once | INTRAMUSCULAR | Status: AC
  Administered 2015-07-07: 1000 ug via INTRAMUSCULAR

## 2015-07-07 MED ORDER — CYANOCOBALAMIN 1000 MCG/ML IJ SOLN: 1000.0000 ug | Freq: Once | INTRAMUSCULAR | Status: DC

## 2015-07-07 NOTE — Progress Notes (Cosign Needed)
Patient ID: Sarah Eaton, female   DOB: October 10, 1986, 29 y.o.   MRN: [id number]  Has gained 5 lbs since last week, pt states she is going to get back at it. Having difficulty with constipation and states nothing works (miralax, mom, fruits, vegetables, etc) and would like a prescription for something. Also states she needs refill of phentermine and b12 but it looks like she has refills in the pharmacy.

## 2015-08-04 ENCOUNTER — Ambulatory Visit: Payer: BLUE CROSS/BLUE SHIELD

## 2015-08-11 ENCOUNTER — Ambulatory Visit (INDEPENDENT_AMBULATORY_CARE_PROVIDER_SITE_OTHER): Payer: BLUE CROSS/BLUE SHIELD | Admitting: Obstetrics and Gynecology

## 2015-08-11 VITALS — BP 121/77 | HR 90 | Ht 63.0 in | Wt 167.8 lb

## 2015-08-11 DIAGNOSIS — E669 Obesity, unspecified: Secondary | ICD-10-CM

## 2015-08-11 MED ORDER — CYANOCOBALAMIN 1000 MCG/ML IJ KIT: 1000.0000 mg | PACK | INTRAMUSCULAR | Status: DC

## 2015-08-11 MED ORDER — CYANOCOBALAMIN 1000 MCG/ML IJ SOLN
1000.0000 ug | Freq: Once | INTRAMUSCULAR | Status: AC
  Administered 2015-08-11: 1000 ug via INTRAMUSCULAR

## 2015-08-11 NOTE — Progress Notes (Cosign Needed)
Patient ID: Sarah Eaton, female   DOB: [birth year], 29 y.o.   MRN: [id number]    Pt presents for 1 month wt, bp, and b12 inj. Pt is down 4# from last visit. No s/e from adipex. Did not bring in b12. States she did not have any refills. Rx for b12 erx. Pt aware to get b12 prior to next visit Last seen by [facility] in 05/2015. Advised NV she will need to see provider.

## 2015-10-20 ENCOUNTER — Encounter: Payer: Self-pay | Admitting: Obstetrics and Gynecology

## 2015-10-20 ENCOUNTER — Ambulatory Visit (INDEPENDENT_AMBULATORY_CARE_PROVIDER_SITE_OTHER): Payer: BLUE CROSS/BLUE SHIELD | Admitting: Obstetrics and Gynecology

## 2015-10-20 VITALS — BP 121/96 | HR 87 | Ht 64.0 in | Wt 172.9 lb

## 2015-10-20 DIAGNOSIS — E663 Overweight: Secondary | ICD-10-CM | POA: Diagnosis not present

## 2015-10-20 MED ORDER — PHENDIMETRAZINE TARTRATE ER 105 MG PO CP24: 1.0000 | ORAL_CAPSULE | Freq: Every day | ORAL | Status: DC

## 2015-10-20 MED ORDER — CYANOCOBALAMIN 1000 MCG/ML IJ KIT: 1000.0000 mg | PACK | INTRAMUSCULAR | Status: DC

## 2015-10-20 NOTE — Progress Notes (Signed)
SUBJECTIVE:  29 y.o. here for follow-up weight loss visit, previously seen 4 weeks ago. Denies any concerns and feels like medication is not working as well. Is still exercising regualrly and eating a small but balanced diet.  OBJECTIVE:  BP 121/96 mmHg  Pulse 87  Ht 5\' 4"  (1.626 m)  Wt 172 lb 14.4 oz (78.427 kg)  BMI 29.66 kg/m2  Body mass index is 29.66 kg/(m^2). Patient appears well. ASSESSMENT:  Obesity- responding well to weight loss plan PLAN:  To continue with B12 but will change to Bontril XR- RX given. B12 101600mcg/ml injection given RTC in 4 weeks as planned  Melody FrostproofShambley, CNM

## 2015-11-27 ENCOUNTER — Ambulatory Visit: Payer: BLUE CROSS/BLUE SHIELD

## 2015-12-04 ENCOUNTER — Ambulatory Visit (INDEPENDENT_AMBULATORY_CARE_PROVIDER_SITE_OTHER): Payer: BLUE CROSS/BLUE SHIELD | Admitting: Obstetrics and Gynecology

## 2015-12-04 VITALS — BP 122/97 | HR 94 | Wt 161.4 lb

## 2015-12-04 DIAGNOSIS — E669 Obesity, unspecified: Secondary | ICD-10-CM

## 2015-12-04 MED ORDER — CYANOCOBALAMIN 1000 MCG/ML IJ SOLN
1000.0000 ug | Freq: Once | INTRAMUSCULAR | Status: AC
  Administered 2015-12-04: 1000 ug via INTRAMUSCULAR

## 2015-12-04 NOTE — Progress Notes (Cosign Needed)
Patient ID: Sarah Eaton, female   DOB: May 19, 1986, 30 y.o.   MRN: [id number] Pt presents for weight, B/P, B-12 injection. No side effects of medication-Phentermine, or B-12.  Weight loss of 11 lbs. Encouraged eating healthy and exercise. Pt states she has been up all night. B/P 122/97. Pt states she will keep a check on her B/P. B-12 provided in house, pt did not bring hers.

## 2015-12-31 ENCOUNTER — Other Ambulatory Visit: Payer: Self-pay | Admitting: Obstetrics and Gynecology

## 2016-01-01 ENCOUNTER — Ambulatory Visit (INDEPENDENT_AMBULATORY_CARE_PROVIDER_SITE_OTHER): Payer: BLUE CROSS/BLUE SHIELD | Admitting: Obstetrics and Gynecology

## 2016-01-01 VITALS — BP 130/87 | HR 86 | Wt 160.8 lb

## 2016-01-01 DIAGNOSIS — E669 Obesity, unspecified: Secondary | ICD-10-CM

## 2016-01-01 MED ORDER — CYANOCOBALAMIN 1000 MCG/ML IJ SOLN
1000.0000 ug | Freq: Once | INTRAMUSCULAR | Status: AC
  Administered 2016-01-01: 1000 ug via INTRAMUSCULAR

## 2016-01-01 NOTE — Progress Notes (Cosign Needed)
Pt is here for wt, bp check,b-12 inj Pt is doing well, denies any s/e    10/19/16- wt -172 12/04/15 wt-161

## 2016-01-29 ENCOUNTER — Ambulatory Visit (INDEPENDENT_AMBULATORY_CARE_PROVIDER_SITE_OTHER): Payer: BLUE CROSS/BLUE SHIELD | Admitting: Obstetrics and Gynecology

## 2016-01-29 ENCOUNTER — Ambulatory Visit: Payer: BLUE CROSS/BLUE SHIELD

## 2016-01-29 VITALS — BP 111/75 | HR 86 | Wt 169.6 lb

## 2016-01-29 DIAGNOSIS — E669 Obesity, unspecified: Secondary | ICD-10-CM

## 2016-01-29 MED ORDER — CYANOCOBALAMIN 1000 MCG/ML IJ SOLN
1000.0000 ug | Freq: Once | INTRAMUSCULAR | Status: AC
  Administered 2016-01-29: 1000 ug via INTRAMUSCULAR

## 2016-01-29 NOTE — Progress Notes (Signed)
Pt is here for wt, bp check, b-12 inj Per pt she has been sick with pneumonia and her children have been really sick, she hasn't taking  Any diet medication this month, she actually gained 9 lbs, will get serious since she is feeling much better

## 2016-02-05 ENCOUNTER — Other Ambulatory Visit: Payer: Self-pay | Admitting: Obstetrics and Gynecology

## 2016-02-28 ENCOUNTER — Ambulatory Visit (INDEPENDENT_AMBULATORY_CARE_PROVIDER_SITE_OTHER): Payer: BLUE CROSS/BLUE SHIELD | Admitting: Obstetrics and Gynecology

## 2016-02-28 ENCOUNTER — Telehealth: Payer: Self-pay | Admitting: Obstetrics and Gynecology

## 2016-02-28 ENCOUNTER — Other Ambulatory Visit: Payer: Self-pay | Admitting: Obstetrics and Gynecology

## 2016-02-28 VITALS — BP 111/83 | HR 84 | Wt 168.6 lb

## 2016-02-28 DIAGNOSIS — E663 Overweight: Secondary | ICD-10-CM

## 2016-02-28 MED ORDER — CYANOCOBALAMIN 1000 MCG/ML IJ SOLN
1000.0000 ug | Freq: Once | INTRAMUSCULAR | Status: AC
  Administered 2016-02-28: 1000 ug via INTRAMUSCULAR

## 2016-02-28 MED ORDER — PHENTERMINE HCL 37.5 MG PO TABS: 37.5000 mg | ORAL_TABLET | Freq: Every day | ORAL | Status: DC

## 2016-02-28 MED ORDER — CYANOCOBALAMIN 1000 MCG/ML IJ KIT: 1000.0000 mg | PACK | INTRAMUSCULAR | Status: DC

## 2016-02-28 NOTE — Progress Notes (Signed)
Patient ID: Sarah Eaton, female   DOB: 16-Apr-1986, 30 y.o.   MRN: [id number] Pt presents for weight, B/P, B-12 injection. No side effects of medication-Phentermine, or B-12.  Weight loss of 1 lbs. Encouraged eating healthy and exercise. Pt out of B-12 so in house was given. To make an appt for 2 wks with [facility] for weight management.

## 2016-02-28 NOTE — Telephone Encounter (Signed)
Sarah Eaton, Sarah Eaton came in this morn. She was supposed to see you for wt mngt. She doesn't have any more phentermine. I could get her on your schedule till June 1st b/c she needed 8am. She wants to know if you will write her a RX for phentermine.

## 2016-02-28 NOTE — Telephone Encounter (Signed)
Pt will pick up rx for phentermine tomorrow. RX for B-12 erx'd to pharmacy.

## 2016-02-28 NOTE — Telephone Encounter (Signed)
Please let her know i did go ahead and refill it. Please send RX to her if she can't come back and pick it up. Also will still need Nurse visit in 4 weeks

## 2016-04-08 ENCOUNTER — Encounter: Payer: Self-pay | Admitting: *Deleted

## 2016-04-08 ENCOUNTER — Emergency Department: Payer: BLUE CROSS/BLUE SHIELD

## 2016-04-08 ENCOUNTER — Emergency Department
Admission: EM | Admit: 2016-04-08 | Discharge: 2016-04-08 | Disposition: A | Discharge status: Home / Self Care | Payer: BLUE CROSS/BLUE SHIELD | Attending: Emergency Medicine | Admitting: Emergency Medicine

## 2016-04-08 DIAGNOSIS — S7002XA Contusion of left hip, initial encounter: Secondary | ICD-10-CM | POA: Diagnosis not present

## 2016-04-08 DIAGNOSIS — S7012XA Contusion of left thigh, initial encounter: Secondary | ICD-10-CM | POA: Diagnosis not present

## 2016-04-08 DIAGNOSIS — S161XXA Strain of muscle, fascia and tendon at neck level, initial encounter: Secondary | ICD-10-CM | POA: Insufficient documentation

## 2016-04-08 DIAGNOSIS — Y999 Unspecified external cause status: Secondary | ICD-10-CM | POA: Diagnosis not present

## 2016-04-08 DIAGNOSIS — S0093XA Contusion of unspecified part of head, initial encounter: Secondary | ICD-10-CM | POA: Insufficient documentation

## 2016-04-08 DIAGNOSIS — S20212A Contusion of left front wall of thorax, initial encounter: Secondary | ICD-10-CM | POA: Diagnosis not present

## 2016-04-08 DIAGNOSIS — Y9389 Activity, other specified: Secondary | ICD-10-CM | POA: Diagnosis not present

## 2016-04-08 DIAGNOSIS — Z79899 Other long term (current) drug therapy: Secondary | ICD-10-CM | POA: Insufficient documentation

## 2016-04-08 DIAGNOSIS — Y92411 Interstate highway as the place of occurrence of the external cause: Secondary | ICD-10-CM | POA: Diagnosis not present

## 2016-04-08 DIAGNOSIS — I10 Essential (primary) hypertension: Secondary | ICD-10-CM | POA: Diagnosis not present

## 2016-04-08 DIAGNOSIS — S060X0A Concussion without loss of consciousness, initial encounter: Secondary | ICD-10-CM | POA: Diagnosis not present

## 2016-04-08 DIAGNOSIS — S0990XA Unspecified injury of head, initial encounter: Secondary | ICD-10-CM | POA: Diagnosis present

## 2016-04-08 LAB — POCT PREGNANCY, URINE: Preg Test, Ur: NEGATIVE

## 2016-04-08 MED ORDER — IBUPROFEN 800 MG PO TABS: 800.0000 mg | ORAL_TABLET | Freq: Three times a day (TID) | ORAL | Status: DC | PRN

## 2016-04-08 MED ORDER — CYCLOBENZAPRINE HCL 10 MG PO TABS: 10.0000 mg | ORAL_TABLET | Freq: Three times a day (TID) | ORAL | Status: DC | PRN
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
## 2016-04-08 NOTE — Discharge Instructions (Signed)
Cervical Sprain A cervical sprain is an injury in the neck in which the strong, fibrous tissues (ligaments) that connect your neck bones stretch or tear. Cervical sprains can range from mild to severe. Severe cervical sprains can cause the neck vertebrae to be unstable. This can lead to damage of the spinal cord and can result in serious nervous system problems. The amount of time it takes for a cervical sprain to get better depends on the cause and extent of the injury. Most cervical sprains heal in 1 to 3 weeks. CAUSES  Severe cervical sprains may be caused by:   Contact sport injuries (such as from football, rugby, wrestling, hockey, auto racing, gymnastics, diving, martial arts, or boxing).   Motor vehicle collisions.   Whiplash injuries. This is an injury from a sudden forward and backward whipping movement of the head and neck.  Falls.  Mild cervical sprains may be caused by:   Being in an awkward position, such as while cradling a telephone between your ear and shoulder.   Sitting in a chair that does not offer proper support.   Working at a poorly Landscape architect station.   Looking up or down for long periods of time.  SYMPTOMS   Pain, soreness, stiffness, or a burning sensation in the front, back, or sides of the neck. This discomfort may develop immediately after the injury or slowly, 24 hours or more after the injury.   Pain or tenderness directly in the middle of the back of the neck.   Shoulder or upper back pain.   Limited ability to move the neck.   Headache.   Dizziness.   Weakness, numbness, or tingling in the hands or arms.   Muscle spasms.   Difficulty swallowing or chewing.   Tenderness and swelling of the neck.  DIAGNOSIS  Most of the time your health care provider can diagnose a cervical sprain by taking your history and doing a physical exam. Your health care provider will ask about previous neck injuries and any known neck  problems, such as arthritis in the neck. X-rays may be taken to find out if there are any other problems, such as with the bones of the neck. Other tests, such as a CT scan or MRI, may also be needed.  TREATMENT  Treatment depends on the severity of the cervical sprain. Mild sprains can be treated with rest, keeping the neck in place (immobilization), and pain medicines. Severe cervical sprains are immediately immobilized. Further treatment is done to help with pain, muscle spasms, and other symptoms and may include:  Medicines, such as pain relievers, numbing medicines, or muscle relaxants.   Physical therapy. This may involve stretching exercises, strengthening exercises, and posture training. Exercises and improved posture can help stabilize the neck, strengthen muscles, and help stop symptoms from returning.  HOME CARE INSTRUCTIONS   Put ice on the injured area.   Put ice in a plastic bag.   Place a towel between your skin and the bag.   Leave the ice on for 15-20 minutes, 3-4 times a day.   If your injury was severe, you may have been given a cervical collar to wear. A cervical collar is a two-piece collar designed to keep your neck from moving while it heals.  Do not remove the collar unless instructed by your health care provider.  If you have long hair, keep it outside of the collar.  Ask your health care provider before making any adjustments to your collar. Minor  adjustments may be required over time to improve comfort and reduce pressure on your chin or on the back of your head.  Ifyou are allowed to remove the collar for cleaning or bathing, follow your health care provider's instructions on how to do so safely.  Keep your collar clean by wiping it with mild soap and water and drying it completely. If the collar you have been given includes removable pads, remove them every 1-2 days and hand wash them with soap and water. Allow them to air dry. They should be completely  dry before you wear them in the collar.  If you are allowed to remove the collar for cleaning and bathing, wash and dry the skin of your neck. Check your skin for irritation or sores. If you see any, tell your health care provider.  Do not drive while wearing the collar.   Only take over-the-counter or prescription medicines for pain, discomfort, or fever as directed by your health care provider.   Keep all follow-up appointments as directed by your health care provider.   Keep all physical therapy appointments as directed by your health care provider.   Make any needed adjustments to your workstation to promote good posture.   Avoid positions and activities that make your symptoms worse.   Warm up and stretch before being active to help prevent problems.  SEEK MEDICAL CARE IF:   Your pain is not controlled with medicine.   You are unable to decrease your pain medicine over time as planned.   Your activity level is not improving as expected.  SEEK IMMEDIATE MEDICAL CARE IF:   You develop any bleeding.  You develop stomach upset.  You have signs of an allergic reaction to your medicine.   Your symptoms get worse.   You develop new, unexplained symptoms.   You have numbness, tingling, weakness, or paralysis in any part of your body.  MAKE SURE YOU:   Understand these instructions.  Will watch your condition.  Will get help right away if you are not doing well or get worse.   This information is not intended to replace advice given to you by your health care provider. Make sure you discuss any questions you have with your health care provider.   Document Released: 09/01/2007 Document Revised: 11/09/2013 Document Reviewed: 05/12/2013 Elsevier Interactive Patient Education 2016 [place].  Blunt Chest Trauma Blunt chest trauma is an injury caused by a blow to the chest. These chest injuries can be very painful. Blunt chest trauma often results in  bruised or broken (fractured) ribs. Most cases of bruised and fractured ribs from blunt chest traumas get better after 1 to 3 weeks of rest and pain medicine. Often, the soft tissue in the chest wall is also injured, causing pain and bruising. Internal organs, such as the heart and lungs, may also be injured. Blunt chest trauma can lead to serious medical problems. This injury requires immediate medical care. CAUSES   Motor vehicle collisions.  Falls.  Physical violence.  Sports injuries. SYMPTOMS   Chest pain. The pain may be worse when you move or breathe deeply.  Shortness of breath.  Lightheadedness.  Bruising.  Tenderness.  Swelling. DIAGNOSIS  Your caregiver will do a physical exam. X-rays may be taken to look for fractures. However, minor rib fractures may not show up on X-rays until a few days after the injury. If a more serious injury is suspected, further imaging tests may be done. This may include ultrasounds, computed  tomography (CT) scans, or magnetic resonance imaging (MRI). TREATMENT  Treatment depends on the severity of your injury. Your caregiver may prescribe pain medicines and deep breathing exercises. HOME CARE INSTRUCTIONS  Limit your activities until you can move around without much pain.  Do not do any strenuous work until your injury is healed.  Put ice on the injured area.  Put ice in a plastic bag.  Place a towel between your skin and the bag.  Leave the ice on for 15-20 minutes, 03-04 times a day.  You may wear a rib belt as directed by your caregiver to reduce pain.  Practice deep breathing as directed by your caregiver to keep your lungs clear.  Only take over-the-counter or prescription medicines for pain, fever, or discomfort as directed by your caregiver. SEEK IMMEDIATE MEDICAL CARE IF:   You have increasing pain or shortness of breath.  You cough up blood.  You have nausea, vomiting, or abdominal pain.  You have a fever.  You  feel dizzy, weak, or you faint. MAKE SURE YOU:  Understand these instructions.  Will watch your condition.  Will get help right away if you are not doing well or get worse.   This information is not intended to replace advice given to you by your health care provider. Make sure you discuss any questions you have with your health care provider.   Document Released: 12/12/2004 Document Revised: 11/25/2014 Document Reviewed: 05/03/2015 Elsevier Interactive Patient Education 2016 [employer].  Concussion, Adult A concussion, or closed-head injury, is a brain injury caused by a direct blow to the head or by a quick and sudden movement (jolt) of the head or neck. Concussions are usually not life-threatening. Even so, the effects of a concussion can be serious. If you have had a concussion before, you are more likely to experience concussion-like symptoms after a direct blow to the head.  CAUSES  Direct blow to the head, such as from running into another player during a soccer game, being hit in a fight, or hitting your head on a hard surface.  A jolt of the head or neck that causes the brain to move back and forth inside the skull, such as in a car crash. SIGNS AND SYMPTOMS The signs of a concussion can be hard to notice. Early on, they may be missed by you, family members, and health care providers. You may look fine but act or feel differently. Symptoms are usually temporary, but they may last for days, weeks, or even longer. Some symptoms may appear right away while others may not show up for hours or days. Every head injury is different. Symptoms include:  Mild to moderate headaches that will not go away.  A feeling of pressure inside your head.  Having more trouble than usual:  Learning or remembering things you have heard.  Answering questions.  Paying attention or concentrating.  Organizing daily tasks.  Making decisions and solving problems.  Slowness in thinking, acting  or reacting, speaking, or reading.  Getting lost or being easily confused.  Feeling tired all the time or lacking energy (fatigued).  Feeling drowsy.  Sleep disturbances.  Sleeping more than usual.  Sleeping less than usual.  Trouble falling asleep.  Trouble sleeping (insomnia).  Loss of balance or feeling lightheaded or dizzy.  Nausea or vomiting.  Numbness or tingling.  Increased sensitivity to:  Sounds.  Lights.  Distractions.  Vision problems or eyes that tire easily.  Diminished sense of taste or smell.  Ringing in the ears.  Mood changes such as feeling sad or anxious.  Becoming easily irritated or angry for little or no reason.  Lack of motivation.  Seeing or hearing things other people do not see or hear (hallucinations). DIAGNOSIS Your health care provider can usually diagnose a concussion based on a description of your injury and symptoms. He or she will ask whether you passed out (lost consciousness) and whether you are having trouble remembering events that happened right before and during your injury. Your evaluation might include:  A brain scan to look for signs of injury to the brain. Even if the test shows no injury, you may still have a concussion.  Blood tests to be sure other problems are not present. TREATMENT  Concussions are usually treated in an emergency department, in urgent care, or at a clinic. You may need to stay in the hospital overnight for further treatment.  Tell your health care provider if you are taking any medicines, including prescription medicines, over-the-counter medicines, and natural remedies. Some medicines, such as blood thinners (anticoagulants) and aspirin, may increase the chance of complications. Also tell your health care provider whether you have had alcohol or are taking illegal drugs. This information may affect treatment.  Your health care provider will send you home with important instructions to  follow.  How fast you will recover from a concussion depends on many factors. These factors include how severe your concussion is, what part of your brain was injured, your age, and how healthy you were before the concussion.  Most people with mild injuries recover fully. Recovery can take time. In general, recovery is slower in older persons. Also, persons who have had a concussion in the past or have other medical problems may find that it takes longer to recover from their current injury. HOME CARE INSTRUCTIONS General Instructions  Carefully follow the directions your health care provider gave you.  Only take over-the-counter or prescription medicines for pain, discomfort, or fever as directed by your health care provider.  Take only those medicines that your health care provider has approved.  Do not drink alcohol until your health care provider says you are well enough to do so. Alcohol and certain other drugs may slow your recovery and can put you at risk of further injury.  If it is harder than usual to remember things, write them down.  If you are easily distracted, try to do one thing at a time. For example, do not try to watch TV while fixing dinner.  Talk with family members or close friends when making important decisions.  Keep all follow-up appointments. Repeated evaluation of your symptoms is recommended for your recovery.  Watch your symptoms and tell others to do the same. Complications sometimes occur after a concussion. Older adults with a brain injury may have a higher risk of serious complications, such as a blood clot on the brain.  Tell your teachers, school nurse, school counselor, coach, athletic trainer, or work Production designer, theatre/television/film about your injury, symptoms, and restrictions. Tell them about what you can or cannot do. They should watch for:  Increased problems with attention or concentration.  Increased difficulty remembering or learning new information.  Increased  time needed to complete tasks or assignments.  Increased irritability or decreased ability to cope with stress.  Increased symptoms.  Rest. Rest helps the brain to heal. Make sure you:  Get plenty of sleep at night. Avoid staying up late at night.  Keep the same bedtime  hours on weekends and weekdays.  Rest during the day. Take daytime naps or rest breaks when you feel tired.  Limit activities that require a lot of thought or concentration. These include:  Doing homework or job-related work.  Watching TV.  Working on the computer.  Avoid any situation where there is potential for another head injury (football, hockey, soccer, basketball, martial arts, downhill snow sports and horseback riding). Your condition will get worse every time you experience a concussion. You should avoid these activities until you are evaluated by the appropriate follow-up health care providers. Returning To Your Regular Activities You will need to return to your normal activities slowly, not all at once. You must give your body and brain enough time for recovery.  Do not return to sports or other athletic activities until your health care provider tells you it is safe to do so.  Ask your health care provider when you can drive, ride a bicycle, or operate heavy machinery. Your ability to react may be slower after a brain injury. Never do these activities if you are dizzy.  Ask your health care provider about when you can return to work or school. Preventing Another Concussion It is very important to avoid another brain injury, especially before you have recovered. In rare cases, another injury can lead to permanent brain damage, brain swelling, or death. The risk of this is greatest during the first 7-10 days after a head injury. Avoid injuries by:  Wearing a seat belt when riding in a car.  Drinking alcohol only in moderation.  Wearing a helmet when biking, skiing, skateboarding, skating, or doing  similar activities.  Avoiding activities that could lead to a second concussion, such as contact or recreational sports, until your health care provider says it is okay.  Taking safety measures in your home.  Remove clutter and tripping hazards from floors and stairways.  Use grab bars in bathrooms and handrails by stairs.  Place non-slip mats on floors and in bathtubs.  Improve lighting in dim areas. SEEK MEDICAL CARE IF:  You have increased problems paying attention or concentrating.  You have increased difficulty remembering or learning new information.  You need more time to complete tasks or assignments than before.  You have increased irritability or decreased ability to cope with stress.  You have more symptoms than before. Seek medical care if you have any of the following symptoms for more than 2 weeks after your injury:  Lasting (chronic) headaches.  Dizziness or balance problems.  Nausea.  Vision problems.  Increased sensitivity to noise or light.  Depression or mood swings.  Anxiety or irritability.  Memory problems.  Difficulty concentrating or paying attention.  Sleep problems.  Feeling tired all the time. SEEK IMMEDIATE MEDICAL CARE IF:  You have severe or worsening headaches. These may be a sign of a blood clot in the brain.  You have weakness (even if only in one hand, leg, or part of the face).  You have numbness.  You have decreased coordination.  You vomit repeatedly.  You have increased sleepiness.  One pupil is larger than the other.  You have convulsions.  You have slurred speech.  You have increased confusion. This may be a sign of a blood clot in the brain.  You have increased restlessness, agitation, or irritability.  You are unable to recognize people or places.  You have neck pain.  It is difficult to wake you up.  You have unusual behavior changes.  You  lose consciousness. MAKE SURE YOU:  Understand these  instructions.  Will watch your condition.  Will get help right away if you are not doing well or get worse.   This information is not intended to replace advice given to you by your health care provider. Make sure you discuss any questions you have with your health care provider.   Document Released: 01/25/2004 Document Revised: 11/25/2014 Document Reviewed: 05/27/2013 Elsevier Interactive Patient Education 2016 [employer]  Head Injury, Adult You have received a head injury. It does not appear serious at this time. Headaches and vomiting are common following head injury. It should be easy to awaken from sleeping. Sometimes it is necessary for you to stay in the emergency department for a while for observation. Sometimes admission to the hospital may be needed. After injuries such as yours, most problems occur within the first 24 hours, but side effects may occur up to 7-10 days after the injury. It is important for you to carefully monitor your condition and contact your health care provider or seek immediate medical care if there is a change in your condition. WHAT ARE THE TYPES OF HEAD INJURIES? Head injuries can be as minor as a bump. Some head injuries can be more severe. More severe head injuries include:  A jarring injury to the brain (concussion).  A bruise of the brain (contusion). This mean there is bleeding in the brain that can cause swelling.  A cracked skull (skull fracture).  Bleeding in the brain that collects, clots, and forms a bump (hematoma). WHAT CAUSES A HEAD INJURY? A serious head injury is most likely to happen to someone who is in a car wreck and is not wearing a seat belt. Other causes of major head injuries include bicycle or motorcycle accidents, sports injuries, and falls. HOW ARE HEAD INJURIES DIAGNOSED? A complete history of the event leading to the injury and your current symptoms will be helpful in diagnosing head injuries. Many times, pictures of the  brain, such as CT or MRI are needed to see the extent of the injury. Often, an overnight hospital stay is necessary for observation.  WHEN SHOULD I SEEK IMMEDIATE MEDICAL CARE?  You should get help right away if:  You have confusion or drowsiness.  You feel sick to your stomach (nauseous) or have continued, forceful vomiting.  You have dizziness or unsteadiness that is getting worse.  You have severe, continued headaches not relieved by medicine. Only take over-the-counter or prescription medicines for pain, fever, or discomfort as directed by your health care provider.  You do not have normal function of the arms or legs or are unable to walk.  You notice changes in the black spots in the center of the colored part of your eye (pupil).  You have a clear or bloody fluid coming from your nose or ears.  You have a loss of vision. During the next 24 hours after the injury, you must stay with someone who can watch you for the warning signs. This person should contact local emergency services (911 in the [place] if you have seizures, you become unconscious, or you are unable to wake up. HOW CAN I PREVENT A HEAD INJURY IN THE FUTURE? The most important factor for preventing major head injuries is avoiding motor vehicle accidents. To minimize the potential for damage to your head, it is crucial to wear seat belts while riding in motor vehicles. Wearing helmets while bike riding and playing collision sports (like football) is also  helpful. Also, avoiding dangerous activities around the house will further help reduce your risk of head injury.  WHEN CAN I RETURN TO NORMAL ACTIVITIES AND ATHLETICS? You should be reevaluated by your health care provider before returning to these activities. If you have any of the following symptoms, you should not return to activities or contact sports until 1 week after the symptoms have stopped:  Persistent headache.  Dizziness or vertigo.  Poor attention and  concentration.  Confusion.  Memory problems.  Nausea or vomiting.  Fatigue or tire easily.  Irritability.  Intolerant of bright lights or loud noises.  Anxiety or depression.  Disturbed sleep. MAKE SURE YOU:   Understand these instructions.  Will watch your condition.  Will get help right away if you are not doing well or get worse.   This information is not intended to replace advice given to you by your health care provider. Make sure you discuss any questions you have with your health care provider.   Document Released: 11/04/2005 Document Revised: 11/25/2014 Document Reviewed: 07/12/2013 Elsevier Interactive Patient Education 2016 [employer]  Iliac Crest Contusion An iliac crest contusion is a deep bruise of your hip bone (hip pointer). Contusions are the result of an injury that caused bleeding under the skin. The contusion may turn blue, purple, or yellow. Minor injuries will give you a painless contusion, but more severe contusions may stay painful and swollen for a few weeks.  CAUSES  An iliac crest contusion is usually caused by a blow to the top of your hip pointer. The injury most often comes from contact sports.  SYMPTOMS   Swelling and redness of the hip area.  Bruising of the hip area.  Tenderness or soreness of the hip. DIAGNOSIS  The diagnosis can be made by taking your history and doing a physical exam. You may need an X-ray of your hip pointer to look for a broken bone (fracture). TREATMENT  Often, the best treatment for an iliac crest contusion is resting, icing, elevating, and applying cold compresses to the injured area. Over-the-counter medicines may also be recommended for pain control. Crutches may be used if walking is very painful. Some people need physical therapy to help with range of motion and strengthening.  HOME CARE INSTRUCTIONS   Put ice on the injured area.  Put ice in a plastic bag.  Place a towel between your skin and the  bag.  Leave the ice on for 15-20 minutes, 03-04 times a day.  Only take over-the-counter or prescription medicines for pain, discomfort, or fever as directed by your caregiver. Your caregiver may recommend avoiding anti-inflammatory medicines (aspirin, ibuprofen, and naproxen) for 48 hours because these medicines may increase bruising.  Keep your leg straightened (extended) when possible.  Walk or move around as the pain allows, or as directed by your caregiver. Use crutches if your caregiver recommends them.  Apply compression wraps as directed by your caregiver. You may remove the wraps for sleeping, showers, and baths. SEEK IMMEDIATE MEDICAL CARE IF:   You have increased bruising or swelling.  You have pain that is getting worse.  Your swelling or pain is not relieved with medicines.  Your toes get cold. MAKE SURE YOU:   Understand these instructions.  Will watch your condition.  Will get help right away if you are not doing well or get worse.   This information is not intended to replace advice given to you by your health care provider. Make sure you discuss any  questions you have with your health care

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
## 2016-04-18 ENCOUNTER — Encounter: Payer: Self-pay | Admitting: Obstetrics and Gynecology

## 2016-04-18 ENCOUNTER — Ambulatory Visit (INDEPENDENT_AMBULATORY_CARE_PROVIDER_SITE_OTHER): Payer: BLUE CROSS/BLUE SHIELD | Admitting: Obstetrics and Gynecology

## 2016-04-18 VITALS — BP 108/68 | HR 81 | Wt 178.6 lb

## 2016-04-18 DIAGNOSIS — E669 Obesity, unspecified: Secondary | ICD-10-CM | POA: Diagnosis not present

## 2016-04-18 MED ORDER — CYANOCOBALAMIN 1000 MCG/ML IJ SOLN
1000.0000 ug | Freq: Once | INTRAMUSCULAR | Status: AC
  Administered 2016-04-18: 1000 ug via INTRAMUSCULAR

## 2016-04-24 NOTE — Progress Notes (Signed)
SUBJECTIVE:  30 y.o. here for follow-up weight loss visit, previously seen 4 weeks ago. Denies any concerns and feels like medication is  Still working and desires continuing.  OBJECTIVE:  BP 108/68 mmHg  Pulse 81  Wt 178 lb 9.6 oz (81.012 kg)  Body mass index is 31.65 kg/(m^2). Patient appears well. ASSESSMENT:  Obesity- responding well to weight loss plan PLAN:  To continue with current medications. B12 105900mcg/ml injection given RTC in 4 weeks as planned  Sylar Voong [place]Shambley, CNM

## 2016-05-17 ENCOUNTER — Ambulatory Visit (INDEPENDENT_AMBULATORY_CARE_PROVIDER_SITE_OTHER): Payer: BLUE CROSS/BLUE SHIELD | Admitting: Obstetrics and Gynecology

## 2016-05-17 VITALS — BP 114/89 | HR 86 | Wt 168.1 lb

## 2016-05-17 DIAGNOSIS — E669 Obesity, unspecified: Secondary | ICD-10-CM | POA: Diagnosis not present

## 2016-05-17 MED ORDER — CYANOCOBALAMIN 1000 MCG/ML IJ SOLN
1000.0000 ug | Freq: Once | INTRAMUSCULAR | Status: AC
  Administered 2016-05-17: 1000 ug via INTRAMUSCULAR

## 2016-05-17 NOTE — Progress Notes (Signed)
Pt presents for weight, B/P, B-12 injection. No side effects of medication-Phentermine, or B-12.  Weight loss of  10 lbs. Encouraged eating healthy and exercise.

## 2016-06-12 ENCOUNTER — Encounter: Payer: Self-pay | Admitting: Emergency Medicine

## 2016-06-12 ENCOUNTER — Emergency Department
Admission: EM | Admit: 2016-06-12 | Discharge: 2016-06-12 | Disposition: A | Discharge status: Home / Self Care | Payer: BLUE CROSS/BLUE SHIELD | Attending: Emergency Medicine | Admitting: Emergency Medicine

## 2016-06-12 DIAGNOSIS — Z79899 Other long term (current) drug therapy: Secondary | ICD-10-CM | POA: Insufficient documentation

## 2016-06-12 DIAGNOSIS — T781XXA Other adverse food reactions, not elsewhere classified, initial encounter: Secondary | ICD-10-CM | POA: Diagnosis not present

## 2016-06-12 DIAGNOSIS — T7840XA Allergy, unspecified, initial encounter: Secondary | ICD-10-CM

## 2016-06-12 DIAGNOSIS — I10 Essential (primary) hypertension: Secondary | ICD-10-CM | POA: Diagnosis not present

## 2016-06-12 MED ORDER — METHYLPREDNISOLONE SODIUM SUCC 125 MG IJ SOLR
125.0000 mg | Freq: Once | INTRAMUSCULAR | Status: AC
  Administered 2016-06-12: 125 mg via INTRAVENOUS

## 2016-06-12 MED ORDER — METHYLPREDNISOLONE SODIUM SUCC 125 MG IJ SOLR
INTRAMUSCULAR | Status: AC
  Administered 2016-06-12: 125 mg via INTRAVENOUS
  Filled 2016-06-12: qty 2

## 2016-06-12 MED ORDER — PREDNISONE 20 MG PO TABS: 40.0000 mg | ORAL_TABLET | Freq: Every day | ORAL | 0 refills | Status: DC

## 2016-06-12 MED ORDER — DIPHENHYDRAMINE HCL 50 MG/ML IJ SOLN
50.0000 mg | Freq: Once | INTRAMUSCULAR | Status: AC
  Administered 2016-06-12: 50 mg via INTRAVENOUS

## 2016-06-12 MED ORDER — FAMOTIDINE IN NACL 20-0.9 MG/50ML-% IV SOLN
20.0000 mg | Freq: Once | INTRAVENOUS | Status: AC
  Administered 2016-06-12: 20 mg via INTRAVENOUS
  Filled 2016-06-12: qty 50

## 2016-06-12 MED ORDER — DIPHENHYDRAMINE HCL 50 MG/ML IJ SOLN
INTRAMUSCULAR | Status: AC
  Administered 2016-06-12: 50 mg via INTRAVENOUS
  Filled 2016-06-12: qty 1

## 2016-06-12 NOTE — ED Triage Notes (Signed)
Pt to ED from work via coworker c/o allergic reaction.  Pt states ate a tuna wrap at work around 1300 and started having burning sensation to tongue, tongue swelling, chest pain, and difficulty breathing.  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  Pt presents A&Ox4, facial edema, tongue swelling, decreased lung sounds, and chest pain.  Dr. Lenard Lance notified and at bedside, orders being placed.

## 2016-06-12 NOTE — ED Notes (Signed)
Pt states "my tongue still feels swollen but I'm breathing a lot better".

## 2016-06-12 NOTE — ED Provider Notes (Signed)
[facility] Emergency Department Provider Note  Time seen: 1:29 PM  I have reviewed the triage vital signs and the nursing notes.   HISTORY  Chief Complaint No chief complaint on file.    HPI Sarah Eaton is a 30 y.o. female with no past medical history who presents to the emergency department after an allergic reaction. According to the patient she was eating a tuna sandwich around 1 PM. She states she felt burning in her mouth or eating the same when she within 10 minutes she felt like her throat was closing and she was having hoarseness when talking. Patient states allergies to bananas are her only known allergies. States a similar feeling when she eats bananas. Has never received an EpiPen. Patient denies any trouble breathing or swallowing. She does have a hoarse voice. Denies any itching or rash.  Past Medical History:  Diagnosis Date  . Hypertension   . Obesity     Patient Active Problem List   Diagnosis Date Noted  . Supervision of high-risk pregnancy 03/25/2013  . History of cesarean delivery, currently pregnant 03/02/2013  . HTN in pregnancy, chronic 02/02/2013    Past Surgical History:  Procedure Laterality Date  . CESAREAN SECTION    . CESAREAN SECTION WITH BILATERAL TUBAL LIGATION Bilateral 08/05/2013   Procedure: REPEAT CESAREAN SECTION WITH BILATERAL TUBAL LIGATION;  Surgeon: Emily Filbert, MD;  Location: [facility];  Service: Obstetrics;  Laterality: Bilateral;    Prior to Admission medications   Medication Sig Start Date End Date Taking? Authorizing Provider  Cyanocobalamin (B-12 COMPLIANCE INJECTION) 1000 MCG/ML KIT Inject 1,000 mg as directed every 30 (thirty) days. 02/28/16   Melody N Shambley, CNM  cyclobenzaprine (FLEXERIL) 10 MG tablet Take 1 tablet (10 mg total) by mouth every 8 (eight) hours as needed for muscle spasms. 04/08/16   Pierce Crane Beers, PA-C  ibuprofen (ADVIL,MOTRIN) 800 MG tablet Take 1 tablet (800 mg total) by mouth every  8 (eight) hours as needed. 04/08/16   Pierce Crane Beers, PA-C  phentermine (ADIPEX-P) 37.5 MG tablet Take 1 tablet (37.5 mg total) by mouth daily. Patient not taking: Reported on 04/18/2016 02/28/16   Melody N Shambley, CNM  rizatriptan (MAXALT) 10 MG tablet TAKE 1 TABLET AS 1 DOSE. MAY REPEAT DOSE EVERY 2 HOURS FOR 2 DOSES 02/06/16   Rubie Maid, MD  topiramate (TOPAMAX) 100 MG tablet Take 100 mg by mouth 2 (two) times daily. 03/26/15   Historical Provider, MD    Allergies Review of patient's allergies indicates no known allergies.  Family History  Problem Relation Age of Onset  . Hypertension Mother   . Cancer Mother     skin  . Cancer Maternal Grandmother   . Hypertension Maternal Grandmother   . Cancer Other     breast    Social History Social History  Substance Use Topics  . Smoking status: Never Smoker  . Smokeless tobacco: Never Used  . Alcohol use Yes    Review of Systems Constitutional: Negative for fever Cardiovascular: Negative for chest pain. Respiratory: Negative for shortness of breath.Positive for hoarse voice. Gastrointestinal: Negative for abdominal pain Musculoskeletal: Negative for back pain. Skin: Negative for rash.Negative for itching. Neurological: Negative for headaches, focal weakness or numbness. 10-point ROS otherwise negative.  ____________________________________________   PHYSICAL EXAM:  Constitutional: Alert and oriented. Well appearing and in no distress. Eyes: Normal exam ENT   Head: Normocephalic and atraumatic.   Mouth/Throat: Mucous membranes are moist. Cardiovascular: Normal rate, regular  rhythm. No murmur Respiratory: Normal respiratory effort without tachypnea nor retractions. Breath sounds are clear  Gastrointestinal: Soft and nontender. No distention.   Musculoskeletal: Nontender with normal range of motion in all extremities. Neurologic:  Normal speech and language. No gross focal neurologic deficits are appreciated. Skin:   Skin is warm, dry and intact.  Psychiatric: Mood and affect are normal. Speech and behavior are normal.   ____________________________________________    EKG  EKG reviewed and interpreted by myself shows sinus tachycardia 104 bpm, narrow QRS, normal axis, normal intervals, nonspecific but no concerning ST changes.  ____________________________________________   INITIAL IMPRESSION / ASSESSMENT AND PLAN / ED COURSE  Pertinent labs & imaging results that were available during my care of the patient were reviewed by me and considered in my medical decision making (see chart for details).  The patient presents the emergency department with difficulty speaking and a feeling of swelling in her throat. Patient states symptoms started approximately 10 minutes after eating a tuna sandwich. Patient denies hives, rash, itching. States similar reactions to bananas in the past. Denies any trouble breathing or swallowing at this time but does state she feels like her throat is somewhat swollen. Patient does have a hoarse voice. Her lung sounds, no wheeze, no oral edema noted. We will treat with IV Pepcid, Solu-Medrol, Benadryl, IV fluids and closely monitor in the emergency department on telemetry.  ----------------------------------------- 3:02 PM on 06/12/2016 -----------------------------------------  Patient appears much better at this time. Still has mild hoarseness, but states it is much improved, feels like the swelling has gone down significantly. Patient continues to have no appreciable swelling on examination but her voice does sound much better. We'll continue monitoring in the emergency department, as long as the patient continues to show improvement plan to discharge approximately one hour.   ----------------------------------------- 4:08 PM on 06/12/2016 -----------------------------------------  Patient appears much improved. Normal voice. Clear lung sounds. We'll discharge home with 5  days of steroids.  ____________________________________________   FINAL CLINICAL IMPRESSION(S) / ED DIAGNOSES  Allergic reaction    Harvest Dark, MD 06/12/16 [phone]

## 2016-06-28 ENCOUNTER — Ambulatory Visit: Payer: BLUE CROSS/BLUE SHIELD | Admitting: Obstetrics and Gynecology

## 2016-07-26 ENCOUNTER — Ambulatory Visit (INDEPENDENT_AMBULATORY_CARE_PROVIDER_SITE_OTHER): Payer: BLUE CROSS/BLUE SHIELD | Admitting: Obstetrics and Gynecology

## 2016-07-26 VITALS — BP 112/83 | HR 75 | Ht 63.0 in | Wt 183.0 lb

## 2016-07-26 DIAGNOSIS — E663 Overweight: Secondary | ICD-10-CM

## 2016-07-26 MED ORDER — CYANOCOBALAMIN 1000 MCG/ML IJ SOLN
1000.0000 ug | Freq: Once | INTRAMUSCULAR | Status: AC
  Administered 2016-07-26: 1000 ug via INTRAMUSCULAR

## 2016-07-26 NOTE — Progress Notes (Signed)
Patient ID: Alease MedinaJulie C Aure, female   DOB: 12-24-1985, 30 y.o.   MRN: [id number] Pt presents for weight, B/P, B-12 injection. No side effects of medication-Phentermine, or B-12.  Weight gain of _13_ lbs. Encouraged eating healthy and exercise. 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 Currently being tested for allergies.

## 2016-08-23 ENCOUNTER — Ambulatory Visit (INDEPENDENT_AMBULATORY_CARE_PROVIDER_SITE_OTHER): Payer: BLUE CROSS/BLUE SHIELD | Admitting: Obstetrics and Gynecology

## 2016-08-23 ENCOUNTER — Encounter: Payer: Self-pay | Admitting: Obstetrics and Gynecology

## 2016-08-23 VITALS — BP 121/84 | HR 88 | Ht 62.0 in | Wt 182.8 lb

## 2016-08-23 DIAGNOSIS — E6609 Other obesity due to excess calories: Secondary | ICD-10-CM

## 2016-08-23 MED ORDER — PHENTERMINE HCL 37.5 MG PO TABS: 37.5000 mg | ORAL_TABLET | Freq: Every day | ORAL | 2 refills | Status: DC

## 2016-08-23 MED ORDER — CYANOCOBALAMIN 1000 MCG/ML IJ SOLN: 1000.0000 ug | INTRAMUSCULAR | 1 refills | Status: DC

## 2016-08-23 MED ORDER — RIZATRIPTAN BENZOATE 10 MG PO TABS: 10.0000 mg | ORAL_TABLET | ORAL | 6 refills | Status: DC | PRN

## 2016-08-23 NOTE — Progress Notes (Signed)
SUBJECTIVE:  30 y.o. here for follow-up weight loss visit, previously seen 4 weeks ago. Denies any concerns and feels like medication has worked well until she had allergic reaction and had to take prednisone. Desires another trial to get more weight off as she is exercising regularly and eating 1200-1400 cal/day.  OBJECTIVE:  BP 121/84   Pulse 88   Ht 5\' 2"  (1.575 m)   Wt 182 lb 12.8 oz (82.9 kg)   BMI 33.43 kg/m   Body mass index is 33.43 kg/m. Patient appears well. ASSESSMENT:  Obesity- responding well to weight loss plan PLAN:  To continue with current medications. B12 107000mcg/ml injection given RTC in 4 weeks as planned  Branndon Tuite [place] LajasShambley, CNM

## 2016-08-30 ENCOUNTER — Telehealth: Payer: Self-pay | Admitting: Obstetrics and Gynecology

## 2016-08-30 NOTE — Telephone Encounter (Signed)
Patient is concerned, she has had a tubal ligation but her menses is 13 days late. She would like a call back. Thanks

## 2016-08-30 NOTE — Telephone Encounter (Signed)
Pt called and stated she was returning your call  

## 2016-08-30 NOTE — Telephone Encounter (Signed)
Called pt we discussed her message, she voiced understanding

## 2016-09-09 ENCOUNTER — Other Ambulatory Visit: Payer: Self-pay | Admitting: *Deleted

## 2016-09-09 DIAGNOSIS — N926 Irregular menstruation, unspecified: Secondary | ICD-10-CM

## 2016-09-11 ENCOUNTER — Other Ambulatory Visit: Payer: BLUE CROSS/BLUE SHIELD

## 2016-09-11 DIAGNOSIS — N926 Irregular menstruation, unspecified: Secondary | ICD-10-CM

## 2016-09-12 LAB — BETA HCG QUANT (REF LAB): hCG Quant: <1 m[IU]/mL

## 2016-09-20 ENCOUNTER — Ambulatory Visit (INDEPENDENT_AMBULATORY_CARE_PROVIDER_SITE_OTHER): Payer: BLUE CROSS/BLUE SHIELD | Admitting: Obstetrics and Gynecology

## 2016-09-20 VITALS — BP 126/89 | HR 87 | Ht 62.0 in | Wt 174.0 lb

## 2016-09-20 DIAGNOSIS — E6609 Other obesity due to excess calories: Secondary | ICD-10-CM

## 2016-09-20 MED ORDER — CYANOCOBALAMIN 1000 MCG/ML IJ SOLN
1000.0000 ug | Freq: Once | INTRAMUSCULAR | Status: AC
  Administered 2016-09-20: 1000 ug via INTRAMUSCULAR

## 2016-09-20 NOTE — Progress Notes (Signed)
Patient ID: Alease MedinaJulie C Eaton, female   DOB: [birth year], 30 y.o.   MRN: [id number] Pt presents for weight, B/P, B-12 injection. No side effects of medication-Phentermine, or B-12.  Weight loss of 8 lbs. Encouraged eating healthy and exercise.

## 2016-10-01 ENCOUNTER — Encounter: Payer: Self-pay | Admitting: Obstetrics and Gynecology

## 2016-10-29 ENCOUNTER — Encounter: Payer: BLUE CROSS/BLUE SHIELD | Admitting: Obstetrics and Gynecology

## 2016-12-13 ENCOUNTER — Ambulatory Visit (INDEPENDENT_AMBULATORY_CARE_PROVIDER_SITE_OTHER): Payer: BLUE CROSS/BLUE SHIELD | Admitting: Obstetrics and Gynecology

## 2016-12-13 ENCOUNTER — Encounter: Payer: Self-pay | Admitting: Obstetrics and Gynecology

## 2016-12-13 VITALS — BP 120/89 | HR 90 | Ht 63.0 in | Wt 187.3 lb

## 2016-12-13 DIAGNOSIS — N912 Amenorrhea, unspecified: Secondary | ICD-10-CM

## 2016-12-13 DIAGNOSIS — L659 Nonscarring hair loss, unspecified: Secondary | ICD-10-CM

## 2016-12-13 DIAGNOSIS — R5383 Other fatigue: Secondary | ICD-10-CM

## 2016-12-13 NOTE — Progress Notes (Signed)
Subjective:     Patient ID: Sarah Eaton, female   DOB: 1985/12/19, 31 y.o.   MRN: [id number]  HPI Here today due to concerns over inability to lose weight, no menses since June 2017 (she was having light menses monthly since ablation); hair loss- much thinner than it has ever been; also noted lactacting a few months ago and stopped breastfeeding [age] year old at 46 months.  Fatigue-generalized. Is exercising regular, and taking phentermine and B12 as directed.   Review of Systems Negative except started above in HPI    Objective:   Physical Exam A&O x4 Well groomed female in no distress Blood pressure 120/89, pulse 90, height _0  (1.6 m), weight 187 lb 4.8 oz (85 kg). Hair notable thin HRR Thyroid without enlargement or noted nodules Abdomen soft and non-tender No noted edema Pelvic exam not indicated    Assessment:     Fatigue Unexplained hair loss Amenorrhea (s/p ablation-but was having light menses until June of 2017)     Plan:     Labs obtained, reassured of normal physical findings except hair loss. RTC as needed per labs.  >50% of 10 minutes spent in counseling.  Melody [place], CNM

## 2016-12-15 LAB — THYROID PANEL WITH TSH
Free Thyroxine Index: 2 (ref 1.2–4.9)
T3 Uptake Ratio: 27 % (ref 24–39)
T4, Total: 7.3 ug/dL (ref 4.5–12.0)
TSH: 1.61 u[IU]/mL (ref 0.450–4.500)

## 2016-12-15 LAB — CBC
HEMATOCRIT: 42.5 % (ref 34.0–46.6)
HEMOGLOBIN: 14.6 g/dL (ref 11.1–15.9)
MCH: 30.5 pg (ref 26.6–33.0)
MCHC: 34.4 g/dL (ref 31.5–35.7)
MCV: 89 fL (ref 79–97)
Platelets: 339 10*3/uL (ref 150–379)
RBC: 4.78 x10E6/uL (ref 3.77–5.28)
RDW: 13.4 % (ref 12.3–15.4)
WBC: 8.1 10*3/uL (ref 3.4–10.8)

## 2016-12-15 LAB — TESTOSTERONE, FREE, TOTAL, SHBG
Sex Hormone Binding: 45.3 nmol/L (ref 24.6–122.0)
Testosterone, Free: 1 pg/mL (ref 0.0–4.2)
Testosterone: 15 ng/dL (ref 8–48)

## 2016-12-15 LAB — ESTRADIOL: ESTRADIOL: 152 pg/mL

## 2016-12-15 LAB — COMPREHENSIVE METABOLIC PANEL
ALBUMIN: 4.5 g/dL (ref 3.5–5.5)
ALT: 12 IU/L (ref 0–32)
AST: 13 IU/L (ref 0–40)
Albumin/Globulin Ratio: 1.6 (ref 1.2–2.2)
Alkaline Phosphatase: 66 IU/L (ref 39–117)
BUN/Creatinine Ratio: 18 (ref 9–23)
BUN: 19 mg/dL (ref 6–20)
Bilirubin Total: 0.4 mg/dL (ref 0.0–1.2)
CALCIUM: 9.9 mg/dL (ref 8.7–10.2)
CO2: 18 mmol/L (ref 18–29)
CREATININE: 1.03 mg/dL — AB (ref 0.57–1.00)
Chloride: 101 mmol/L (ref 96–106)
GFR calc Af Amer: 85 mL/min/{1.73_m2} (ref >59)
GFR, EST NON AFRICAN AMERICAN: 74 mL/min/{1.73_m2} (ref >59)
GLOBULIN, TOTAL: 2.9 g/dL (ref 1.5–4.5)
Glucose: 74 mg/dL (ref 65–99)
Potassium: 4.7 mmol/L (ref 3.5–5.2)
SODIUM: 137 mmol/L (ref 134–144)
Total Protein: 7.4 g/dL (ref 6.0–8.5)

## 2016-12-15 LAB — VITAMIN D 25 HYDROXY (VIT D DEFICIENCY, FRACTURES): Vit D, 25-Hydroxy: 30.2 ng/mL (ref 30.0–100.0)

## 2016-12-15 LAB — FSH/LH
FSH: 3.1 m[IU]/mL
LH: 12 m[IU]/mL

## 2016-12-15 LAB — FERRITIN: FERRITIN: 76 ng/mL (ref 15–150)

## 2016-12-15 LAB — PROGESTERONE: PROGESTERONE: 12.7 ng/mL

## 2016-12-15 LAB — PROLACTIN: Prolactin: 16.5 ng/mL (ref 4.8–23.3)

## 2016-12-15 LAB — DHEA-SULFATE: DHEA-SO4: 134.9 ug/dL (ref 84.8–378.0)

## 2016-12-19 ENCOUNTER — Encounter: Payer: Self-pay | Admitting: Obstetrics and Gynecology

## 2016-12-27 ENCOUNTER — Encounter: Payer: Self-pay | Admitting: Obstetrics and Gynecology

## 2016-12-27 ENCOUNTER — Ambulatory Visit (INDEPENDENT_AMBULATORY_CARE_PROVIDER_SITE_OTHER): Payer: BLUE CROSS/BLUE SHIELD | Admitting: Obstetrics and Gynecology

## 2016-12-27 VITALS — BP 127/88 | HR 103 | Ht 63.0 in | Wt 186.0 lb

## 2016-12-27 DIAGNOSIS — E669 Obesity, unspecified: Secondary | ICD-10-CM | POA: Diagnosis not present

## 2016-12-31 NOTE — Progress Notes (Signed)
Subjective:     Patient ID: Sarah Eaton, female   DOB: 1985-12-04, 31 y.o.   MRN: [id number]  HPI Here to review labs and ultrasound. Denies any changes since last visit.  Review of Systems Negative except fatigue and hair loss described in previous visit.    Objective:   Physical Exam A&O x4 Well groomed female in no distress Blood pressure 127/88, pulse (!) 103, height 5\' 3"  (1.6 m), weight 186 lb (84.4 kg).     Assessment:     fatigue and hair loss Medication reviewed    Plan:     Reviewed normal findings. Patient still frustrated and feels like something wrong. Referred to endocrinologist as requested RTC as needed.  Sarah Eaton, CNM

## 2017-01-06 ENCOUNTER — Encounter: Payer: Self-pay | Admitting: Obstetrics and Gynecology

## 2017-02-24 ENCOUNTER — Telehealth: Payer: Self-pay | Admitting: Obstetrics and Gynecology

## 2017-02-24 NOTE — Telephone Encounter (Signed)
Patient wanted to know if she could get a refill on phentermine before her appointment since her appointment had to be rescheduled. Thanks

## 2017-02-24 NOTE — Telephone Encounter (Signed)
Melody do you feel she is ready to try the phentermine again??? Pls advise

## 2017-02-25 ENCOUNTER — Other Ambulatory Visit: Payer: Self-pay | Admitting: *Deleted

## 2017-02-25 ENCOUNTER — Encounter: Payer: BLUE CROSS/BLUE SHIELD | Admitting: Obstetrics and Gynecology

## 2017-02-25 MED ORDER — PHENTERMINE HCL 37.5 MG PO TABS: 37.5000 mg | ORAL_TABLET | Freq: Every day | ORAL | 0 refills | Status: DC

## 2017-02-25 NOTE — Telephone Encounter (Signed)
Yes it is fine to give her one refill till appt.

## 2017-02-25 NOTE — Telephone Encounter (Signed)
Done-ac 

## 2017-03-19 ENCOUNTER — Encounter: Payer: BLUE CROSS/BLUE SHIELD | Admitting: Obstetrics and Gynecology

## 2017-03-21 ENCOUNTER — Ambulatory Visit (INDEPENDENT_AMBULATORY_CARE_PROVIDER_SITE_OTHER): Payer: BLUE CROSS/BLUE SHIELD | Admitting: Obstetrics and Gynecology

## 2017-03-21 ENCOUNTER — Encounter: Payer: Self-pay | Admitting: Obstetrics and Gynecology

## 2017-03-21 VITALS — BP 120/82 | HR 92 | Ht 62.0 in | Wt 187.5 lb

## 2017-03-21 DIAGNOSIS — E669 Obesity, unspecified: Secondary | ICD-10-CM | POA: Diagnosis not present

## 2017-03-21 DIAGNOSIS — Z79899 Other long term (current) drug therapy: Secondary | ICD-10-CM

## 2017-03-21 MED ORDER — BUPROPION HCL 100 MG PO TABS: 100.0000 mg | ORAL_TABLET | Freq: Every day | ORAL | 2 refills | Status: DC

## 2017-03-21 MED ORDER — PHENTERMINE HCL 37.5 MG PO TABS: 37.5000 mg | ORAL_TABLET | Freq: Every day | ORAL | 0 refills | Status: DC

## 2017-03-21 MED ORDER — CYANOCOBALAMIN 1000 MCG/ML IJ SOLN: 1000.0000 ug | INTRAMUSCULAR | 1 refills | Status: DC

## 2017-03-21 NOTE — Progress Notes (Signed)
Subjective:     Patient ID: Alease MedinaJulie C Wellbrock, female   DOB: [birth year], 31 y.o.   MRN: [id number]  HPI Still concerned about hair thinning and loss. Is taking vitamins. Also not lost any weight. Is currently jogging and drinking a lot of water as before. States noticeable fatigue if misses dose of oral B12.  Review of Systems Negative except stated above in HPI    Objective:   Physical Exam A&Ox4 Well groomed female  Blood pressure 120/82, pulse 92, height 5\' 2"  (1.575 m), weight 187 lb 8 oz (85 kg). Hair normal in appearance Thyroid normal on exam     Assessment:     Obesity Medication management     Plan:     Will try adding wellbutrin 100mg  daily to phentermine, and restart B12 injections. RTC in 4 weeks or as needed.  Markelle Asaro El MangiShambley, CNM

## 2017-03-31 ENCOUNTER — Encounter: Payer: Self-pay | Admitting: Obstetrics and Gynecology

## 2017-03-31 ENCOUNTER — Other Ambulatory Visit: Payer: Self-pay | Admitting: *Deleted

## 2017-03-31 MED ORDER — FLUCONAZOLE 150 MG PO TABS: 150.0000 mg | ORAL_TABLET | Freq: Once | ORAL | 3 refills | Status: AC

## 2017-04-02 ENCOUNTER — Other Ambulatory Visit: Payer: Self-pay | Admitting: Obstetrics and Gynecology

## 2017-04-02 ENCOUNTER — Encounter: Payer: Self-pay | Admitting: Obstetrics and Gynecology

## 2017-04-02 ENCOUNTER — Ambulatory Visit (INDEPENDENT_AMBULATORY_CARE_PROVIDER_SITE_OTHER): Payer: BLUE CROSS/BLUE SHIELD | Admitting: Obstetrics and Gynecology

## 2017-04-02 VITALS — BP 118/84 | HR 96 | Ht 62.0 in | Wt 190.6 lb

## 2017-04-02 DIAGNOSIS — Z01419 Encounter for gynecological examination (general) (routine) without abnormal findings: Secondary | ICD-10-CM | POA: Diagnosis not present

## 2017-04-02 NOTE — Patient Instructions (Signed)
 Preventive Care 18-39 Years, Female Preventive care refers to lifestyle choices and visits with your health care provider that can promote health and wellness. What does preventive care include?  A yearly physical exam. This is also called an annual well check.  Dental exams once or twice a year.  Routine eye exams. Ask your health care provider how often you should have your eyes checked.  Personal lifestyle choices, including:  Daily care of your teeth and gums.  Regular physical activity.  Eating a healthy diet.  Avoiding tobacco and drug use.  Limiting alcohol use.  Practicing safe sex.  Taking vitamin and mineral supplements as recommended by your health care provider. What happens during an annual well check? The services and screenings done by your health care provider during your annual well check will depend on your age, overall health, lifestyle risk factors, and family history of disease. Counseling  Your health care provider may ask you questions about your:  Alcohol use.  Tobacco use.  Drug use.  Emotional well-being.  Home and relationship well-being.  Sexual activity.  Eating habits.  Work and work environment.  Method of birth control.  Menstrual cycle.  Pregnancy history. Screening  You may have the following tests or measurements:  Height, weight, and BMI.  Diabetes screening. This is done by checking your blood sugar (glucose) after you have not eaten for a while (fasting).  Blood pressure.  Lipid and cholesterol levels. These may be checked every 5 years starting at age 20.  Skin check.  Hepatitis C blood test.  Hepatitis B blood test.  Sexually transmitted disease (STD) testing.  BRCA-related cancer screening. This may be done if you have a family history of breast, ovarian, tubal, or peritoneal cancers.  Pelvic exam and Pap test. This may be done every 3 years starting at age 21. Starting at age 30, this may be done  every 5 years if you have a Pap test in combination with an HPV test. Discuss your test results, treatment options, and if necessary, the need for more tests with your health care provider. Vaccines  Your health care provider may recommend certain vaccines, such as:  Influenza vaccine. This is recommended every year.  Tetanus, diphtheria, and acellular pertussis (Tdap, Td) vaccine. You may need a Td booster every 10 years.  Varicella vaccine. You may need this if you have not been vaccinated.  HPV vaccine. If you are 26 or younger, you may need three doses over 6 months.  Measles, mumps, and rubella (MMR) vaccine. You may need at least one dose of MMR. You may also need a second dose.  Pneumococcal 13-valent conjugate (PCV13) vaccine. You may need this if you have certain conditions and were not previously vaccinated.  Pneumococcal polysaccharide (PPSV23) vaccine. You may need one or two doses if you smoke cigarettes or if you have certain conditions.  Meningococcal vaccine. One dose is recommended if you are age 19-21 years and a first-year college student living in a residence hall, or if you have one of several medical conditions. You may also need additional booster doses.  Hepatitis A vaccine. You may need this if you have certain conditions or if you travel or work in places where you may be exposed to hepatitis A.  Hepatitis B vaccine. You may need this if you have certain conditions or if you travel or work in places where you may be exposed to hepatitis B.  Haemophilus influenzae type b (Hib) vaccine. You may need   this if you have certain risk factors. Talk to your health care provider about which screenings and vaccines you need and how often you need them. This information is not intended to replace advice given to you by your health care provider. Make sure you discuss any questions you have with your health care provider. Document Released: 12/31/2001 Document Revised:  07/24/2016 Document Reviewed: 09/05/2015 Elsevier Interactive Patient Education  2017 [employer] Inc.  

## 2017-04-02 NOTE — Progress Notes (Signed)
   Subjective:     Alease MedinaJulie C Lantier is a 31 y.o. female and is here for a comprehensive physical exam. The patient reports no problems. Divorced white female G2P2, lesbian in monogamous relationship for a few years.  Social History   Social History  . Marital status: Married    Spouse name: N/A  . Number of children: N/A  . Years of education: N/A   Occupational History  . Not on file.   Social History Main Topics  . Smoking status: Never Smoker  . Smokeless tobacco: Never Used  . Alcohol use Yes     Comment: occasionally  . Drug use: No  . Sexual activity: Not Currently    Birth control/ protection: None   Other Topics Concern  . Not on file   Social History Narrative  . No narrative on file   Health Maintenance  Topic Date Due  . PAP SMEAR  02/28/2015  . INFLUENZA VACCINE  06/18/2017  . TETANUS/TDAP  08/06/2023  . HIV Screening  Completed    The following portions of the patient's history were reviewed and updated as appropriate: allergies, current medications, past family history, past medical history, past social history, past surgical history and problem list.  Review of Systems A comprehensive review of systems was negative.   Objective:    General appearance: alert, cooperative and appears stated age Neck: no adenopathy, no carotid bruit, no JVD, supple, symmetrical, trachea midline and thyroid not enlarged, symmetric, no tenderness/mass/nodules Lungs: clear to auscultation bilaterally Breasts: normal appearance, no masses or tenderness Heart: regular rate and rhythm, S1, S2 normal, no murmur, click, rub or gallop Abdomen: soft, non-tender; bowel sounds normal; no masses,  no organomegaly Pelvic: cervix normal in appearance, external genitalia normal, no adnexal masses or tenderness, no cervical motion tenderness, rectovaginal septum normal, uterus normal size, shape, and consistency and vagina normal without discharge    Assessment:    Healthy female  exam. obesity     Plan:     See After Visit Summary for Counseling Recommendations

## 2017-04-07 LAB — CYTOLOGY - PAP

## 2017-04-18 ENCOUNTER — Encounter: Payer: Self-pay | Admitting: Obstetrics and Gynecology

## 2017-04-18 ENCOUNTER — Ambulatory Visit (INDEPENDENT_AMBULATORY_CARE_PROVIDER_SITE_OTHER): Payer: BLUE CROSS/BLUE SHIELD | Admitting: Obstetrics and Gynecology

## 2017-04-18 VITALS — BP 120/88 | HR 85 | Wt 187.5 lb

## 2017-04-18 DIAGNOSIS — E663 Overweight: Secondary | ICD-10-CM

## 2017-04-18 MED ORDER — CYANOCOBALAMIN 1000 MCG/ML IJ SOLN
1000.0000 ug | Freq: Once | INTRAMUSCULAR | Status: AC
  Administered 2017-04-18: 1000 ug via INTRAMUSCULAR

## 2017-04-18 NOTE — Progress Notes (Signed)
Pt is here for wt, bp check, b-12 inj She is doing well, trying really hard to exercise and healthy eating   04/18/17 wt- 187.5lb 03/21/17 wt- 187lb 12/27/16 wt- 186

## 2017-04-28 ENCOUNTER — Ambulatory Visit: Payer: BLUE CROSS/BLUE SHIELD

## 2017-05-19 ENCOUNTER — Ambulatory Visit (INDEPENDENT_AMBULATORY_CARE_PROVIDER_SITE_OTHER): Payer: BLUE CROSS/BLUE SHIELD | Admitting: Obstetrics and Gynecology

## 2017-05-19 ENCOUNTER — Encounter: Payer: Self-pay | Admitting: Obstetrics and Gynecology

## 2017-05-19 VITALS — BP 124/82 | HR 88 | Wt 185.0 lb

## 2017-05-19 DIAGNOSIS — E663 Overweight: Secondary | ICD-10-CM | POA: Diagnosis not present

## 2017-05-19 MED ORDER — CYANOCOBALAMIN 1000 MCG/ML IJ SOLN
1000.0000 ug | Freq: Once | INTRAMUSCULAR | Status: AC
  Administered 2017-05-19: 1000 ug via INTRAMUSCULAR

## 2017-05-19 NOTE — Progress Notes (Signed)
Pt is here for wt, bp check, b-12 inj She denies any s/e, doing ok  05/19/17 wt- 185lb 04/18/17 wt- 187

## 2017-05-27 ENCOUNTER — Encounter: Payer: Self-pay | Admitting: Obstetrics and Gynecology

## 2017-05-28 ENCOUNTER — Emergency Department (HOSPITAL_COMMUNITY)
Admission: EM | Admit: 2017-05-28 | Discharge: 2017-05-29 | Disposition: A | Discharge status: Home / Self Care | Payer: BLUE CROSS/BLUE SHIELD | Attending: Emergency Medicine | Admitting: Emergency Medicine

## 2017-05-28 ENCOUNTER — Encounter (HOSPITAL_COMMUNITY): Payer: Self-pay | Admitting: Emergency Medicine

## 2017-05-28 DIAGNOSIS — R102 Pelvic and perineal pain: Secondary | ICD-10-CM | POA: Diagnosis present

## 2017-05-28 DIAGNOSIS — A6004 Herpesviral vulvovaginitis: Secondary | ICD-10-CM | POA: Diagnosis not present

## 2017-05-28 DIAGNOSIS — Z79899 Other long term (current) drug therapy: Secondary | ICD-10-CM | POA: Insufficient documentation

## 2017-05-28 DIAGNOSIS — I10 Essential (primary) hypertension: Secondary | ICD-10-CM | POA: Insufficient documentation

## 2017-05-28 MED ORDER — ACYCLOVIR 200 MG PO CAPS
400.0000 mg | ORAL_CAPSULE | Freq: Once | ORAL | Status: DC
  Filled 2017-05-28: qty 2

## 2017-05-28 MED ORDER — ACYCLOVIR 400 MG PO TABS: 400.0000 mg | ORAL_TABLET | Freq: Every day | ORAL | 0 refills | Status: DC

## 2017-05-28 NOTE — ED Triage Notes (Signed)
Pt c/o vaginal irritation  

## 2017-05-28 NOTE — Discharge Instructions (Signed)
As discussed, you are being treated for possible genital herpes infection, although your lab tests are still pending.  Take the medicine prescribed, your next dose tomorrow morning.

## 2017-05-29 MED ORDER — ACYCLOVIR 800 MG PO TABS
ORAL_TABLET | ORAL | Status: AC
  Administered 2017-05-29: 400 mg
  Filled 2017-05-29: qty 1

## 2017-05-29 NOTE — ED Provider Notes (Signed)
[facility]PT Provider Note   CSN: [id number] Arrival date & time: 05/28/17  2146     History   Chief Complaint Chief Complaint  Patient presents with  . Vaginal Pain    HPI Sarah Eaton is a 31 y.o. female presenting with an external genital rash which she first noticed a few days ago.  She describes a solitary clear fluid filled blister which drained, and now has a cluster of blisters which have started to spread.  She reports mild discomfort but not overt pain at the site and has had no vaginal discharge.  She denies fevers, chills, dysuria. She is sexually active, her partner has no similar sx. She has had no treatment prior to arrival and denies prior similar sx.  The history is provided by the patient.    Past Medical History:  Diagnosis Date  . Hypertension   . Obesity     There are no active problems to display for this patient.   Past Surgical History:  Procedure Laterality Date  . CESAREAN SECTION    . CESAREAN SECTION WITH BILATERAL TUBAL LIGATION Bilateral 08/05/2013   Procedure: REPEAT CESAREAN SECTION WITH BILATERAL TUBAL LIGATION;  Surgeon: Allie Bossier, MD;  Location: [facility];  Service: Obstetrics;  Laterality: Bilateral;    OB History    Gravida Para Term Preterm AB Living   2 2 2     2    SAB TAB Ectopic Multiple Live Births           2       Home Medications    Prior to Admission medications   Medication Sig Start Date End Date Taking? Authorizing Provider  acyclovir (ZOVIRAX) 400 MG tablet Take 1 tablet (400 mg total) by mouth 5 (five) times daily. 05/28/17   Burgess Amor, PA-C  buPROPion (WELLBUTRIN) 100 MG tablet Take 1 tablet (100 mg total) by mouth daily. 03/21/17   Shambley, Melody N, CNM  cyanocobalamin (,VITAMIN B-12,) 1000 MCG/ML injection Inject 1 mL (1,000 mcg total) into the muscle every 30 (thirty) days. 08/23/16   Shambley, Melody N, CNM  cyanocobalamin (,VITAMIN B-12,) 1000 MCG/ML injection Inject 1 mL (1,000 mcg total) into the  muscle every 30 (thirty) days. 03/21/17   Shambley, Melody N, CNM  phentermine (ADIPEX-P) 37.5 MG tablet Take 1 tablet (37.5 mg total) by mouth daily before breakfast. 03/21/17   Shambley, Melody N, CNM  rizatriptan (MAXALT) 10 MG tablet Take 1 tablet (10 mg total) by mouth as needed for migraine. May repeat in 2 hours if needed 08/23/16   Shambley, Melody N, CNM  topiramate (TOPAMAX) 100 MG tablet Take 100 mg by mouth 2 (two) times daily. 03/26/15   [provider]    Family History Family History  Problem Relation Age of Onset  . Cancer Other        breast  . Hypertension Mother   . Cancer Mother        skin  . Thyroid disease Mother   . Cancer Maternal Grandmother   . Hypertension Maternal Grandmother   . Thyroid disease Father   . Thyroid disease Paternal Grandmother     Social History Social History  Substance Use Topics  . Smoking status: Never Smoker  . Smokeless tobacco: Never Used  . Alcohol use Yes     Comment: occasionally     Allergies   Banana and Tuna [fish allergy]   Review of Systems Review of Systems  Constitutional: Negative for fever.  HENT:  Negative for congestion and sore throat.   Eyes: Negative.   Respiratory: Negative for chest tightness and shortness of breath.   Cardiovascular: Negative for chest pain.  Gastrointestinal: Negative for abdominal pain and nausea.  Genitourinary: Positive for genital sores. Negative for dysuria, vaginal discharge and vaginal pain.  Musculoskeletal: Negative for arthralgias, joint swelling and neck pain.  Skin: Negative.  Negative for rash and wound.  Neurological: Negative for dizziness, weakness, light-headedness, numbness and headaches.  Psychiatric/Behavioral: Negative.      Physical Exam Updated Vital Signs BP (!) 136/112   Pulse 93   Temp 98 F (36.7 C)   Resp 17   Ht 5\' 2"  (1.575 m)   Wt 84.4 kg (186 lb)   SpO2 100%   BMI 34.02 kg/m   Physical Exam  Constitutional: She appears  well-developed and well-nourished.  HENT:  Head: Normocephalic and atraumatic.  Eyes: Conjunctivae are normal.  Neck: Normal range of motion.  Cardiovascular: Normal rate, regular rhythm, normal heart sounds and intact distal pulses.   Pulmonary/Chest: Effort normal and breath sounds normal. She has no wheezes.  Abdominal: Soft. Bowel sounds are normal. There is no tenderness.  Genitourinary: There is rash on the right labia.  Genitourinary Comments: Clear fluid filled vesicles, grouping between the right labia majora and minora. No surrounding erythema.  There is a solitary sloughed ulcer on the mons, and solitary intact vesicle on the clitoral hood. These lesions have surrounding erythema.   Musculoskeletal: Normal range of motion.  Neurological: She is alert.  Skin: Skin is warm and dry.  Psychiatric: She has a normal mood and affect.  Nursing note and vitals reviewed.    ED Treatments / Results  Labs (all labs ordered are listed, but only abnormal results are displayed) Labs Reviewed  HSV CULTURE AND TYPING  HSV 2 ANTIBODY, IGG    EKG  EKG Interpretation None       Radiology No results found.  Procedures Procedures (including critical care time)  Medications Ordered in ED Medications  acyclovir (ZOVIRAX) 800 MG tablet (400 mg  Given 05/29/17 0019)     Initial Impression / Assessment and Plan / ED Course  I have reviewed the triage vital signs and the nursing notes.  Pertinent labs & imaging results that were available during my care of the patient were reviewed by me and considered in my medical decision making (see chart for details).     Pt with vesicular genital rash. Suspect HSV.  HSV culture and HSV antibody collected and pending.  Will start acyclovir.  Pt aware labs are pending. Advised abstinence until diagnostics are completed and f/u with her gynecologist for f/u care (in [place]).  Final Clinical Impressions(s) / ED Diagnoses   Final diagnoses:   Herpes simplex vulvovaginitis    New Prescriptions Discharge Medication List as of 05/28/2017 11:43 PM    START taking these medications   Details  acyclovir (ZOVIRAX) 400 MG tablet Take 1 tablet (400 mg total) by mouth 5 (five) times daily., Starting Wed 05/28/2017, Print         Burgess Amordol, Kripa, Cordelia Poche-C 05/29/17 2319    Donnetta Hutchingook, Brian, MD 05/31/17 [phone]

## 2017-05-30 ENCOUNTER — Encounter: Payer: Self-pay | Admitting: Certified Nurse Midwife

## 2017-05-30 ENCOUNTER — Ambulatory Visit (INDEPENDENT_AMBULATORY_CARE_PROVIDER_SITE_OTHER): Payer: BLUE CROSS/BLUE SHIELD | Admitting: Certified Nurse Midwife

## 2017-05-30 VITALS — BP 124/90 | HR 88 | Ht 62.0 in | Wt 185.5 lb

## 2017-05-30 DIAGNOSIS — R238 Other skin changes: Secondary | ICD-10-CM | POA: Diagnosis not present

## 2017-05-30 LAB — HSV 2 ANTIBODY, IGG: HSV 2 Glycoprotein G Ab, IgG: <0.91 index (ref 0.00–0.90)

## 2017-05-30 MED ORDER — LIDOCAINE HCL 2 % EX GEL
1.0000 | CUTANEOUS | 2 refills | Status: DC | PRN
Start: 2017-05-30 — End: 2018-05-18

## 2017-06-01 LAB — HSV CULTURE AND TYPING

## 2017-06-02 NOTE — Progress Notes (Signed)
GYN ENCOUNTER NOTE  Subjective:       Sarah Eaton is a 31 y.o. [phone] female is here for gynecologic evaluation of genital rash, onset a few days ago.   Evaluated at [facility] on 05/29/2017 and being treated for HSV pending results.   Reports small fluid fluid blisters that are swollen and painful, located on the inside and outside of her vagina.   Denies difficulty breathing or respiratory distress, fever, chest pain, abdominal pain, vaginal bleeding, dysuria, and leg pain or swelling.   She has had the same partner for the last two (2) years. Her partner reports no symptoms.    Gynecologic History  No LMP recorded (lmp unknown). Patient has had an ablation.   Contraception: tubal ligation   Last Pap: 03/2017. Results were: normal  Obstetric History  OB History  Gravida Para Term Preterm AB Living  2 2 2     2   SAB TAB Ectopic Multiple Live Births          2    # Outcome Date GA Lbr Len/2nd Weight Sex Delivery Anes PTL Lv  2 Term 08/05/13 [redacted]w[redacted]d  6 lb 11.8 oz (3.055 kg) M CS-LTranv Spinal  LIV  1 Term 01/14/10 [redacted]w[redacted]d   F CS-LTranv Spinal  LIV     Birth Comments: Failed IOL      Past Medical History:  Diagnosis Date  . Hypertension   . Obesity     Past Surgical History:  Procedure Laterality Date  . CESAREAN SECTION    . CESAREAN SECTION WITH BILATERAL TUBAL LIGATION Bilateral 08/05/2013   Procedure: REPEAT CESAREAN SECTION WITH BILATERAL TUBAL LIGATION;  Surgeon: Allie Bossier, MD;  Location: [facility];  Service: Obstetrics;  Laterality: Bilateral;  . TUBAL LIGATION      Current Outpatient Prescriptions on File Prior to Visit  Medication Sig Dispense Refill  . acyclovir (ZOVIRAX) 400 MG tablet Take 1 tablet (400 mg total) by mouth 5 (five) times daily. 50 tablet 0  . buPROPion (WELLBUTRIN) 100 MG tablet Take 1 tablet (100 mg total) by mouth daily. 30 tablet 2  . cyanocobalamin (,VITAMIN B-12,) 1000 MCG/ML injection Inject 1 mL (1,000 mcg total) into the muscle every 30  (thirty) days. 10 mL 1  . phentermine (ADIPEX-P) 37.5 MG tablet Take 1 tablet (37.5 mg total) by mouth daily before breakfast. 30 tablet 0  . rizatriptan (MAXALT) 10 MG tablet Take 1 tablet (10 mg total) by mouth as needed for migraine. May repeat in 2 hours if needed 10 tablet 6  . topiramate (TOPAMAX) 100 MG tablet Take 100 mg by mouth 2 (two) times daily.  5  . [DISCONTINUED] Phendimetrazine Tartrate 105 MG CP24 Take 1 capsule (105 mg total) by mouth daily. (Patient not taking: Reported on 01/29/2016) 30 each 2   No current facility-administered medications on file prior to visit.     Allergies  Allergen Reactions  . Banana Anaphylaxis  . [place] Gum [Fish Allergy] Anaphylaxis    Social History   Social History  . Marital status: Married    Spouse name: N/A  . Number of children: N/A  . Years of education: N/A   Occupational History  . Not on file.   Social History Main Topics  . Smoking status: Never Smoker  . Smokeless tobacco: Never Used  . Alcohol use Yes     Comment: occasionally  . Drug use: No  . Sexual activity: Yes    Birth control/ protection: None   Other  Topics Concern  . Not on file   Social History Narrative  . No narrative on file    Family History  Problem Relation Age of Onset  . Cancer Other        breast  . Hypertension Mother   . Cancer Mother        skin  . Thyroid disease Mother   . Cancer Maternal Grandmother   . Hypertension Maternal Grandmother   . Thyroid disease Father   . Thyroid disease Paternal Grandmother     The following portions of the patient's history were reviewed and updated as appropriate: allergies, current medications, past family history, past medical history, past social history, past surgical history and problem list.  Review of Systems  Review of Systems - Negative except as note above.  History obtained from the patient.   Objective:   BP 124/90   Pulse 88   Ht 5\' 2"  (1.575 m)   Wt 185 lb 8 oz (84.1 kg)    LMP  (LMP Unknown)   BMI 33.93 kg/m    HSV 2 antibody, IgG    Ref Range & Units 5d ago  HSV 2 Glycoprotein G Ab, IgG 0.00 - 0.90 index <0.91            HSV culture and typing:   PELVIC:  External Genitalia: less than 10 red vesicles    scattered across mons pubis, intact  Vagina: Open sloughed ulcers to clitoral hood and   upper right labial majora, erythema present   Assessment:   1. 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

## 2017-06-02 NOTE — Patient Instructions (Signed)
Herpes Simplex Test There are two common types of herpes simplex virus (HSV). These are classified as Type 1 (HSV1) or Type 2 (HSV2). Type 1 often causes cold sores on or around the mouth and sometimes on or around the eyes. Type 2 is commonly known as a sexually transmitted infection that causes sores in and around the genitals. Both types of herpes simplex can cause sores in different areas. There are two types of herpes simplex tests. These include:  Culture. This consists of collecting and testing a sample of fluid with a cotton swab from an open sore. This can only be done during an active infection (outbreak). Culture tests take several days to complete but are very accurate.  HSV blood tests. This test is not as accurate as a culture. However, HSV blood tests are faster than cultures, often providing a test result within one day. ? HSV antibody test. This checks for the presence of antibodies against HSV in your blood. Antibodies are proteins your body makes to help fight infection. ? HSV antigen test. This checks for the presence of the HSV germ (antigen) in your blood.  Your health care provider may recommend you have a HSV test if:  He or she believes you have a HSV infection.  You have a weakened immune system (immunocompromised) and you have sores around your mouth or genitals that look like HSV eruptions.  You have a fever of unknown origin (FUO).  You are pregnant, have herpes, and are expecting to deliver a baby vaginally in the next 6-8 weeks.  What do the results mean? It is your responsibility to obtain your test results. Ask the lab or department performing the test when and how you will get your results. Contact your health care provider to discuss any questions you have about your results. Range of Normal Values Ranges for normal values may vary among different labs and hospitals. You should always check with your health care provider after having lab work or other tests  done to discuss whether your values are considered within normal limits. Normal findings include:  No HSV antigen or antibodies present in your blood.  No HSV antigen present in cultured fluid.  Meaning of Results Outside Normal Ranges The following test results may indicate that you have an active HSV infection:  Positive culture for HSV1 or HSV2.  Presence of HSV1 or HSV2 antigens in your blood.  Presence of certain HSV1 or HSV2 antibodies (IgM) in your blood.  Discuss your test results with your health care provider. He or she will use the results to make a diagnosis and determine a treatment plan that is right for you. Talk with your health care provider to discuss your results, treatment options, and if necessary, the need for more tests. Talk with your health care provider if you have any questions about your results. This information is not intended to replace advice given to you by your health care provider. Make sure you discuss any questions you have with your health care provider. Document Released: 12/07/2004 Document Revised: 07/10/2016 Document Reviewed: 03/22/2014 Elsevier Interactive Patient Education  2018 [employer]. Genital Herpes Genital herpes is a common sexually transmitted infection (STI) that is caused by a virus. The virus spreads from person to person through sexual contact. Infection can cause itching, blisters, and sores around the genitals or rectum. Symptoms may last several days and then go away This is called an outbreak. However, the virus remains in your body, so you may have  more outbreaks in the future. The time between outbreaks varies and can be months or years. Genital herpes affects men and women. It is particularly concerning for pregnant women because the virus can be passed to the baby during delivery and can cause serious problems. Genital herpes is also a concern for people who have a weak disease-fighting (immune) system. What are the  causes? This condition is caused by the herpes simplex virus (HSV) type 1 or type 2. The virus may spread through:  Sexual contact with an infected person, including vaginal, anal, and oral sex.  Contact with fluid from a herpes sore.  The skin. This means that you can get herpes from an infected partner even if he or she does not have a visible sore or does not know that he or she is infected.  What increases the risk? You are more likely to develop this condition if:  You have sex with many partners.  You do not use latex condoms during sex.  What are the signs or symptoms? Most people do not have symptoms (asymptomatic) or have mild symptoms that may be mistaken for other skin problems. Symptoms may include:  Small red bumps near the genitals, rectum, or mouth. These bumps turn into blisters and then turn into sores.  Flu-like symptoms, including: ? Fever. ? Body aches. ? Swollen lymph nodes. ? Headache.  Painful urination.  Pain and itching in the genital area or rectal area.  Vaginal discharge.  Tingling or shooting pain in the legs and buttocks.  Generally, symptoms are more severe and last longer during the first (primary) outbreak. Flu-like symptoms are also more common during the primary outbreak. How is this diagnosed? Genital herpes may be diagnosed based on:  A physical exam.  Your medical history.  Blood tests.  A test of a fluid sample (culture) from an open sore.  How is this treated? There is no cure for this condition, but treatment with antiviral medicines that are taken by mouth (orally) can do the following:  Speed up healing and relieve symptoms.  Help to reduce the spread of the virus to sexual partners.  Limit the chance of future outbreaks, or make future outbreaks shorter.  Lessen symptoms of future outbreaks.  Your health care provider may also recommend pain relief medicines, such as aspirin or ibuprofen. Follow these instructions  at home: Sexual activity  Do not have sexual contact during active outbreaks.  Practice safe sex. Latex condoms and female condoms may help prevent the spread of the herpes virus. General instructions  Keep the affected areas dry and clean.  Take over-the-counter and prescription medicines only as told by your health care provider.  Avoid rubbing or touching blisters and sores. If you do touch blisters or sores: ? Wash your hands thoroughly with soap and water. ? Do not touch your eyes afterward.  To help relieve pain or itching, you may take the following actions as directed by your health care provider: ? Apply a cold, wet cloth (cold compress) to affected areas 4-6 times a day. ? Apply a substance that protects your skin and reduces bleeding (astringent). ? Apply a gel that helps relieve pain around sores (lidocaine gel). ? Take a warm, shallow bath that cleans the genital area (sitz bath).  Keep all follow-up visits as told by your health care provider. This is important. How is this prevented?  Use condoms. Although anyone can get genital herpes during sexual contact, even with the use of a condom,  a condom can provide some protection.  Avoid having multiple sexual partners.  Talk with your sexual partner about any symptoms either of you may have. Also, talk with your partner about any history of STIs.  Get tested for STIs before you have sex. Ask your partner to do the same.  Do not have sexual contact if you have symptoms of genital herpes. Contact a health care provider if:  Your symptoms are not improving with medicine.  Your symptoms return.  You have new symptoms.  You have a fever.  You have abdominal pain.  You have redness, swelling, or pain in your eye.  You notice new sores on other parts of your body.  You are a woman and experience bleeding between menstrual periods.  You have had herpes and you become pregnant or plan to become  pregnant. Summary  Genital herpes is a common sexually transmitted infection (STI) that is caused by the herpes simplex virus (HSV) type 1 or type 2.  These viruses are most often spread through sexual contact with an infected person.  You are more likely to develop this condition if you have sex with many partners or you have unprotected sex.  Most people do not have symptoms (asymptomatic) or have mild symptoms that may be mistaken for other skin problems. Symptoms occur as outbreaks that may happen months or years apart.  There is no cure for this condition, but treatment with oral antiviral medicines can reduce symptoms, reduce the chance of spreading the virus to a partner, prevent future outbreaks, or shorten future outbreaks. This information is not intended to replace advice given to you by your health care provider. Make sure you discuss any questions you have with your health care provider. Document Released: 11/01/2000 Document Revised: 10/04/2016 Document Reviewed: 10/04/2016 Elsevier Interactive Patient Education  2017 [employer]

## 2017-06-05 ENCOUNTER — Encounter: Payer: Self-pay | Admitting: Obstetrics and Gynecology

## 2017-06-06 ENCOUNTER — Encounter: Payer: Self-pay | Admitting: Certified Nurse Midwife

## 2017-06-06 LAB — HSV(HERPES SIMPLEX VRS) I + II AB-IGM: HSVI/II Comb IgM: <0.91 Ratio (ref 0.00–0.90)

## 2017-06-13 ENCOUNTER — Encounter: Payer: Self-pay | Admitting: Certified Nurse Midwife

## 2017-06-13 ENCOUNTER — Ambulatory Visit (INDEPENDENT_AMBULATORY_CARE_PROVIDER_SITE_OTHER): Payer: BLUE CROSS/BLUE SHIELD | Admitting: Certified Nurse Midwife

## 2017-06-13 VITALS — BP 110/81 | HR 80 | Ht 62.0 in | Wt 189.3 lb

## 2017-06-13 DIAGNOSIS — Z09 Encounter for follow-up examination after completed treatment for conditions other than malignant neoplasm: Secondary | ICD-10-CM | POA: Diagnosis not present

## 2017-06-13 DIAGNOSIS — B0089 Other herpesviral infection: Secondary | ICD-10-CM

## 2017-06-13 NOTE — Progress Notes (Signed)
GYN ENCOUNTER NOTE  Subjective:       Sarah MedinaJulie C Kubitz is a 31 y.o. [phone] female here for follow up and review of lab results.   Denies difficulty breathing or respiratory distress, chest pain, abdominal pain, dysuria, vaginal bleeding, open sores, and leg pain or swelling.      Gynecologic History  No LMP recorded (lmp unknown). Patient has had an ablation.  Contraception: tubal ligation  Last Pap: 03/2017. Results were: normal  Obstetric History  OB History  Gravida Para Term Preterm AB Living  2 2 2     2   SAB TAB Ectopic Multiple Live Births          2    # Outcome Date GA Lbr Len/2nd Weight Sex Delivery Anes PTL Lv  2 Term 08/05/13 [id number]  6 lb 11.8 oz (3.055 kg) M CS-LTranv Spinal  LIV  1 Term 01/14/10 [id number]   F CS-LTranv Spinal  LIV     Birth Comments: Failed IOL      Past Medical History:  Diagnosis Date  . Hypertension   . Obesity     Past Surgical History:  Procedure Laterality Date  . CESAREAN SECTION    . CESAREAN SECTION WITH BILATERAL TUBAL LIGATION Bilateral 08/05/2013   Procedure: REPEAT CESAREAN SECTION WITH BILATERAL TUBAL LIGATION;  Surgeon: Allie BossierMyra C Dove, MD;  Location: [facility];  Service: Obstetrics;  Laterality: Bilateral;  . TUBAL LIGATION      Current Outpatient Prescriptions on File Prior to Visit  Medication Sig Dispense Refill  . acyclovir (ZOVIRAX) 400 MG tablet Take 1 tablet (400 mg total) by mouth 5 (five) times daily. 50 tablet 0  . buPROPion (WELLBUTRIN) 100 MG tablet Take 1 tablet (100 mg total) by mouth daily. 30 tablet 2  . cyanocobalamin (,VITAMIN B-12,) 1000 MCG/ML injection Inject 1 mL (1,000 mcg total) into the muscle every 30 (thirty) days. 10 mL 1  . lidocaine (XYLOCAINE) 2 % jelly Apply 1 application topically as needed. 30 mL 2  . phentermine (ADIPEX-P) 37.5 MG tablet Take 1 tablet (37.5 mg total) by mouth daily before breakfast. 30 tablet 0  . rizatriptan (MAXALT) 10 MG tablet Take 1 tablet (10 mg total) by mouth as needed for  migraine. May repeat in 2 hours if needed 10 tablet 6  . topiramate (TOPAMAX) 100 MG tablet Take 100 mg by mouth 2 (two) times daily.  5  . [DISCONTINUED] Phendimetrazine Tartrate 105 MG CP24 Take 1 capsule (105 mg total) by mouth daily. (Patient not taking: Reported on 01/29/2016) 30 each 2   No current facility-administered medications on file prior to visit.     Allergies  Allergen Reactions  . Banana Anaphylaxis  . [place] Gumuna [Fish Allergy] Anaphylaxis    Social History   Social History  . Marital status: Married    Spouse name: N/A  . Number of children: N/A  . Years of education: N/A   Occupational History  . Not on file.   Social History Main Topics  . Smoking status: Never Smoker  . Smokeless tobacco: Never Used  . Alcohol use Yes     Comment: occasionally  . Drug use: No  . Sexual activity: Yes    Birth control/ protection: None   Other Topics Concern  . Not on file   Social History Narrative  . No narrative on file    Family History  Problem Relation Age of Onset  . Cancer Other        breast  .  Hypertension Mother   . Cancer Mother        skin  . Thyroid disease Mother   . Cancer Maternal Grandmother   . Hypertension Maternal Grandmother   . Thyroid disease Father   . Thyroid disease Paternal Grandmother     The following portions of the patient's history were reviewed and updated as appropriate: allergies, current medications, past family history, past medical history, past social history, past surgical history and problem list.  Review of Systems  Review of Systems - Negative except as noted above.  History obtained from the patient.   Objective:   BP 110/81   Pulse 80   Ht 5\' 2"  (1.575 m)   Wt 189 lb 4.8 oz (85.9 kg)   LMP  (LMP Unknown)   BMI 34.62 kg/m   GENERAL: Alert and oriented x 4, no apparent distress.   PELVIC:  External Genitalia: Normal  Vagina: Normal  Labs:    Hsv Culture And Typing (05/28/2017)  Component 2wk ago    HSV Culture/Type Comment    Comment: (NOTE)  Positive for Herpes simplex virus type-1. Typing was confirmed by  monoclonal antibody microscopic immunofluorescence.   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 Follow up   Plan:   Results reviewed with pt, likely HSV-1 (cold sore) flare.   Discussed red flag symptoms and when to call.   Advised blood testing with next annual exam.   RTC as needed.    Gunnar BullaJenkins Michelle Heavyn Yearsley, CNM

## 2017-06-13 NOTE — Patient Instructions (Signed)
Cold Sore A cold sore, also called a fever blister, is a skin infection that causes small, fluid-filled sores to form inside of the mouth or on the lips, gums, nose, chin, or cheeks. Cold sores can spread to other parts of the body, such as the eyes or fingers. In some people with other medical conditions, cold sores can spread to multiple other body sites, including the genitals. Cold sores can be spread or passed from person to person (contagious) until the sores crust over completely. What are the causes? Cold sores are caused by the herpes simplex virus (HSV-1). HSV-1 is closely related to the virus that causes genital herpes (HSV-2), but these viruses are not the same. Once a person is infected with HSV-1, the virus remains permanently in the body. HSV-1 is spread from person to person through close contact, such as through kissing, touching the affected area, or sharing personal items such as lip balm, razors, or eating utensils. What increases the risk? A cold sore outbreak is more likely to develop in people who:  Are tired, stressed, or sick.  Are menstruating.  Are pregnant.  Take certain medicines.  Are exposed to cold weather or too much sun.  What are the signs or symptoms? Symptoms of a cold sore outbreak often go through different stages. Here is how a cold sore develops:  Tingling, itching, or burning is felt 1-2 days before the outbreak.  Fluid-filled blisters appear on the lips, inside the mouth, on the nose, or on the cheeks.  The blisters start to ooze clear fluid.  The blisters dry up and a yellow crust appears in its place.  The crust falls off.  Other symptoms include:  Fever.  Sore throat.  Headache.  Muscle aches.  Swollen neck glands.  You also may not have any symptoms. How is this diagnosed? This condition is often diagnosed based on your medical history and a physical exam. Your health care provider may swab your sore and then examine it in  the lab. Rarely, blood tests may be done to check for HSV-1. How is this treated? There is no cure for cold sores or HSV-1. There also is no vaccine for HSV-1. Most cold sores go away on their own without treatment within two weeks. Medicines cannot make the infection go away, but medicines can:  Help relieve some of the pain associated with the sores.  Work to stop the virus from multiplying.  Shorten healing time.  Medicines may be in the form of creams, gels, pills, or a shot. Follow these instructions at home: Medicines  Take or apply over-the-counter and prescription medicines only as told by your health care provider.  Use a cotton-tip swab to apply creams or gels to your sores. Sore Care  Do not touch the sores or pick the scabs.  Wash your hands often. Do not touch your eyes without washing your hands first.  Keep the sores clean and dry.  If directed, apply ice to the sores: ? Put ice in a plastic bag. ? Place a towel between your skin and the bag. ? Leave the ice on for 20 minutes, 2-3 times per day. Lifestyle  Do not kiss, have oral sex, or share personal items until your sores heal.  Eat a soft, bland diet. Avoid eating hot, cold, or salty foods. These can hurt your mouth.  Use a straw if it hurts to drink out of a glass.  Avoid the sun and limit your stress if these things   trigger outbreaks. If sun causes cold sores, apply sunscreen on your lips before being out in the sun. Contact a health care provider if:  You have symptoms for more than two weeks.  You have pus coming from the sores.  You have redness that is spreading.  You have pain or irritation in your eye.  You get sores on your genitals.  Your sores do not heal within two weeks.  You have frequent cold sore outbreaks. Get help right away if:  You have a fever and your symptoms suddenly get worse.  You have a headache and confusion. This information is not intended to replace advice  given to you by your health care provider. Make sure you discuss any questions you have with your health care provider. Document Released: 11/01/2000 Document Revised: 06/28/2016 Document Reviewed: 08/25/2015 Elsevier Interactive Patient Education  2018 Elsevier Inc.  

## 2017-06-16 ENCOUNTER — Ambulatory Visit (INDEPENDENT_AMBULATORY_CARE_PROVIDER_SITE_OTHER): Payer: BLUE CROSS/BLUE SHIELD | Admitting: Obstetrics and Gynecology

## 2017-06-16 VITALS — BP 118/78 | HR 98 | Wt 187.0 lb

## 2017-06-16 DIAGNOSIS — E663 Overweight: Secondary | ICD-10-CM

## 2017-06-19 ENCOUNTER — Encounter: Payer: Self-pay | Admitting: Obstetrics and Gynecology

## 2017-06-19 MED ORDER — CYANOCOBALAMIN 1000 MCG/ML IJ SOLN
1000.0000 ug | Freq: Once | INTRAMUSCULAR | Status: AC
  Administered 2018-08-31: 1000 ug via INTRAMUSCULAR

## 2017-06-19 NOTE — Progress Notes (Signed)
Pt is here for wt, bp check, b-12 inj, she is doing well, pt request refill on her Phentermine  06/16/17 wt- 189lb 05/30/17 wt- 185lb

## 2017-06-23 ENCOUNTER — Other Ambulatory Visit: Payer: Self-pay | Admitting: Obstetrics and Gynecology

## 2017-06-27 ENCOUNTER — Encounter: Payer: Self-pay | Admitting: Obstetrics and Gynecology

## 2017-06-27 ENCOUNTER — Other Ambulatory Visit: Payer: Self-pay | Admitting: Obstetrics and Gynecology

## 2017-06-27 MED ORDER — PHENTERMINE HCL 37.5 MG PO TABS: 37.5000 mg | ORAL_TABLET | Freq: Every day | ORAL | 0 refills | Status: DC

## 2017-06-27 MED ORDER — RIZATRIPTAN BENZOATE 10 MG PO TABS: 10.0000 mg | ORAL_TABLET | ORAL | 6 refills | Status: DC | PRN

## 2017-08-04 ENCOUNTER — Encounter: Payer: Self-pay | Admitting: Obstetrics and Gynecology

## 2017-08-04 ENCOUNTER — Ambulatory Visit: Payer: BLUE CROSS/BLUE SHIELD | Admitting: Obstetrics and Gynecology

## 2017-08-14 ENCOUNTER — Other Ambulatory Visit: Payer: Self-pay | Admitting: Obstetrics and Gynecology

## 2017-08-31 ENCOUNTER — Other Ambulatory Visit: Payer: Self-pay | Admitting: Obstetrics and Gynecology

## 2017-10-11 ENCOUNTER — Other Ambulatory Visit: Payer: Self-pay | Admitting: Obstetrics and Gynecology

## 2017-10-12 ENCOUNTER — Other Ambulatory Visit: Payer: Self-pay | Admitting: Obstetrics and Gynecology

## 2017-10-22 ENCOUNTER — Encounter: Payer: Self-pay | Admitting: *Deleted

## 2017-11-19 ENCOUNTER — Encounter: Payer: Self-pay | Admitting: Obstetrics and Gynecology

## 2017-11-19 ENCOUNTER — Ambulatory Visit: Payer: BLUE CROSS/BLUE SHIELD | Admitting: Obstetrics and Gynecology

## 2017-11-19 VITALS — BP 122/75 | HR 86 | Ht 62.0 in | Wt 210.3 lb

## 2017-11-19 DIAGNOSIS — E669 Obesity, unspecified: Secondary | ICD-10-CM | POA: Diagnosis not present

## 2017-11-19 DIAGNOSIS — Z79899 Other long term (current) drug therapy: Secondary | ICD-10-CM | POA: Diagnosis not present

## 2017-11-19 MED ORDER — RIZATRIPTAN BENZOATE 10 MG PO TABS: 10.0000 mg | ORAL_TABLET | ORAL | 6 refills | Status: DC | PRN

## 2017-11-19 MED ORDER — PHENTERMINE HCL 37.5 MG PO TABS: 37.5000 mg | ORAL_TABLET | Freq: Every day | ORAL | 0 refills | Status: DC

## 2017-11-19 MED ORDER — CYANOCOBALAMIN 1000 MCG/ML IJ SOLN: 1000.0000 ug | INTRAMUSCULAR | 1 refills | Status: DC

## 2017-11-19 NOTE — Progress Notes (Signed)
Subjective:  Alease MedinaJulie C Tigue is a 32 y.o. G2P2002 at Unknown being seen today for weight loss management- initial visit.  Patient reports General ROS: negative   Onset followed:  Feel and broke left elbow and has been restricted on exercise, stopped weight loss medication. But desires restart now. Past treatment has included: small frequent feedings, vitamin B-12 injections, appetite stimulant, exercise management and discontinuation of medication.  The following portions of the patient's history were reviewed and updated as appropriate: allergies, current medications, past family history, past medical history, past social history, past surgical history and problem list.   Objective:   Vitals:   11/19/17 0841  BP: 122/75  Pulse: 86  Weight: 210 lb 4.8 oz (95.4 kg)  Height: 5\' 2"  (1.575 m)    General:  Alert, oriented and cooperative. Patient is in no acute distress.  :   :   :   :   :   :   PE: Well groomed female in no current distress,   Mental Status: Normal mood and affect. Normal behavior. Normal judgment and thought content.   Current BMI: Body mass index is 38.46 kg/m.   Assessment and Plan:  Obesity  There are no diagnoses linked to this encounter.  Plan: low carb, High protein diet RX for adipex 37.5 mg daily and B12 1000mcg.ml monthly, to start now with first injection given at today's visit. Reviewed side-effects common to both medications and expected outcomes. Increase daily water intake to at least 8 bottle a day, every day.  Goal is to reduse weight by 10% by end of three months, and will re-evaluate then.  RTC in 4 weeks for Nurse visit to check weight & BP, and get next B12 injections.    Please refer to After Visit Summary for other counseling recommendations.    [place] TansiShambley, Angelynn Lemus N, CNM   Jedrek Dinovo SardisN Lekesha Claw, CNM      Consider the Low Glycemic Index Diet and 6 smaller meals daily .  This boosts your metabolism and regulates your  sugars:   Use the protein bar by Atkins because they have lots of fiber in them  Find the low carb flatbreads, tortillas and pita breads for sandwiches:  Joseph's makes a pita bread and a flat bread , available at [facility] and [employer]; Toufayah makes a low carb flatbread available at [employer] and HT that is 9 net carbs and 100 cal Mission makes a low carb whole wheat tortilla available at [employer],and most grocery stores with 6 net carbs and 210 cal  [place] yogurt can still have a lot of carbs .  Dannon Light N fit has 80 cal and 8 carbs

## 2017-12-16 ENCOUNTER — Ambulatory Visit: Payer: BLUE CROSS/BLUE SHIELD | Admitting: Obstetrics and Gynecology

## 2017-12-26 ENCOUNTER — Encounter: Payer: Self-pay | Admitting: Obstetrics and Gynecology

## 2018-01-12 ENCOUNTER — Ambulatory Visit (INDEPENDENT_AMBULATORY_CARE_PROVIDER_SITE_OTHER): Payer: BLUE CROSS/BLUE SHIELD | Admitting: Certified Nurse Midwife

## 2018-01-12 ENCOUNTER — Encounter: Payer: Self-pay | Admitting: Obstetrics and Gynecology

## 2018-01-12 VITALS — BP 128/84 | HR 88 | Wt 213.1 lb

## 2018-01-12 DIAGNOSIS — Z7689 Persons encountering health services in other specified circumstances: Secondary | ICD-10-CM

## 2018-01-12 MED ORDER — CYANOCOBALAMIN 1000 MCG/ML IJ SOLN
1000.0000 ug | Freq: Once | INTRAMUSCULAR | Status: AC
  Administered 2018-01-12: 1000 ug via INTRAMUSCULAR

## 2018-01-12 NOTE — Progress Notes (Signed)
Pt is here for wt, bp check, b-12 inj She is doing well, went up 3lbs, advised her to get serious and start exercising  01/12/18 wt- 213lb 11/19/17 wt- 210lb

## 2018-01-18 NOTE — Progress Notes (Signed)
I have reviewed the record and concur with patient management and plan.    Lennon Boutwell Michelle Ardelia Wrede, CNM [facility] 

## 2018-02-09 ENCOUNTER — Encounter: Payer: Self-pay | Admitting: Obstetrics and Gynecology

## 2018-02-09 ENCOUNTER — Ambulatory Visit (INDEPENDENT_AMBULATORY_CARE_PROVIDER_SITE_OTHER): Payer: BLUE CROSS/BLUE SHIELD | Admitting: Obstetrics and Gynecology

## 2018-02-09 VITALS — BP 114/80 | HR 82 | Wt 217.9 lb

## 2018-02-09 DIAGNOSIS — E669 Obesity, unspecified: Secondary | ICD-10-CM

## 2018-02-09 MED ORDER — CYANOCOBALAMIN 1000 MCG/ML IJ SOLN
1000.0000 ug | Freq: Once | INTRAMUSCULAR | Status: AC
  Administered 2018-02-09: 1000 ug via INTRAMUSCULAR

## 2018-02-09 NOTE — Progress Notes (Signed)
Pt is here for wt, bp check, b-12 inj She is doing well, states she has been exercising  02/09/18 wt- 219lb 01/12/18 wt- 213lb

## 2018-03-04 ENCOUNTER — Encounter: Payer: Self-pay | Admitting: Emergency Medicine

## 2018-03-04 ENCOUNTER — Emergency Department
Admission: EM | Admit: 2018-03-04 | Discharge: 2018-03-04 | Disposition: A | Discharge status: Home / Self Care | Payer: BLUE CROSS/BLUE SHIELD | Attending: Emergency Medicine | Admitting: Emergency Medicine

## 2018-03-04 ENCOUNTER — Other Ambulatory Visit: Payer: Self-pay

## 2018-03-04 DIAGNOSIS — K219 Gastro-esophageal reflux disease without esophagitis: Secondary | ICD-10-CM

## 2018-03-04 DIAGNOSIS — R109 Unspecified abdominal pain: Secondary | ICD-10-CM | POA: Diagnosis present

## 2018-03-04 DIAGNOSIS — I10 Essential (primary) hypertension: Secondary | ICD-10-CM | POA: Insufficient documentation

## 2018-03-04 DIAGNOSIS — Z3202 Encounter for pregnancy test, result negative: Secondary | ICD-10-CM | POA: Diagnosis not present

## 2018-03-04 LAB — COMPREHENSIVE METABOLIC PANEL
ALBUMIN: 4 g/dL (ref 3.5–5.0)
ALK PHOS: 59 U/L (ref 38–126)
ALT: 28 U/L (ref 14–54)
ANION GAP: 7 (ref 5–15)
AST: 22 U/L (ref 15–41)
BUN: 21 mg/dL — ABNORMAL HIGH (ref 6–20)
CALCIUM: 8.6 mg/dL — AB (ref 8.9–10.3)
CHLORIDE: 103 mmol/L (ref 101–111)
CO2: 27 mmol/L (ref 22–32)
Creatinine, Ser: 0.8 mg/dL (ref 0.44–1.00)
GFR calc Af Amer: >60 mL/min (ref >60)
GFR calc non Af Amer: >60 mL/min (ref >60)
GLUCOSE: 79 mg/dL (ref 65–99)
Potassium: 3.8 mmol/L (ref 3.5–5.1)
SODIUM: 137 mmol/L (ref 135–145)
Total Bilirubin: 0.6 mg/dL (ref 0.3–1.2)
Total Protein: 7.3 g/dL (ref 6.5–8.1)

## 2018-03-04 LAB — CBC
HCT: 40 % (ref 35.0–47.0)
HEMOGLOBIN: 13.4 g/dL (ref 12.0–16.0)
MCH: 30.2 pg (ref 26.0–34.0)
MCHC: 33.6 g/dL (ref 32.0–36.0)
MCV: 90.1 fL (ref 80.0–100.0)
Platelets: 291 10*3/uL (ref 150–440)
RBC: 4.44 MIL/uL (ref 3.80–5.20)
RDW: 13.8 % (ref 11.5–14.5)
WBC: 10.5 10*3/uL (ref 3.6–11.0)

## 2018-03-04 LAB — LIPASE, BLOOD: LIPASE: 32 U/L (ref 11–51)

## 2018-03-04 LAB — URINALYSIS, COMPLETE (UACMP) WITH MICROSCOPIC
Bacteria, UA: NONE SEEN
Bilirubin Urine: NEGATIVE
GLUCOSE, UA: NEGATIVE mg/dL
Hgb urine dipstick: NEGATIVE
KETONES UR: NEGATIVE mg/dL
Leukocytes, UA: NEGATIVE
Nitrite: NEGATIVE
PROTEIN: NEGATIVE mg/dL
Specific Gravity, Urine: 1.026 (ref 1.005–1.030)
pH: 6 (ref 5.0–8.0)

## 2018-03-04 LAB — PREGNANCY, URINE: Preg Test, Ur: NEGATIVE

## 2018-03-04 MED ORDER — SUCRALFATE 1 G PO TABS
1.0000 g | ORAL_TABLET | Freq: Once | ORAL | Status: AC
  Administered 2018-03-04: 1 g via ORAL
  Filled 2018-03-04: qty 1

## 2018-03-04 MED ORDER — GI COCKTAIL ~~LOC~~
30.0000 mL | Freq: Once | ORAL | Status: AC
  Administered 2018-03-04: 30 mL via ORAL
  Filled 2018-03-04: qty 30

## 2018-03-04 MED ORDER — FAMOTIDINE 20 MG PO TABS: 20.0000 mg | ORAL_TABLET | Freq: Two times a day (BID) | ORAL | 1 refills | Status: DC

## 2018-03-04 MED ORDER — FAMOTIDINE 20 MG PO TABS
20.0000 mg | ORAL_TABLET | Freq: Once | ORAL | Status: AC
  Administered 2018-03-04: 20 mg via ORAL
  Filled 2018-03-04: qty 1

## 2018-03-04 MED ORDER — SUCRALFATE 1 G PO TABS: 1.0000 g | ORAL_TABLET | Freq: Four times a day (QID) | ORAL | 1 refills | Status: DC

## 2018-03-04 MED ORDER — ZOLPIDEM TARTRATE 10 MG PO TABS: 10.0000 mg | ORAL_TABLET | Freq: Every evening | ORAL | 0 refills | Status: DC | PRN

## 2018-03-04 NOTE — ED Provider Notes (Signed)
[facility] Emergency Department Provider Note       Time seen: ----------------------------------------- 10:27 AM on 03/04/2018 -----------------------------------------   I have reviewed the triage vital signs and the nursing notes.  HISTORY   Chief Complaint Abdominal Pain    HPI Sarah Eaton is a 32 y.o. female with a history of hypertension and obesity who presents to the ED for upper abdominal pain that started upon awakening today.  Patient states she had difficulty standing up because the pain was so intense, this has improved somewhat.  She has not eaten anything other than a bag of chips while she was waiting.  She does report a history of acid reflux for which she normally takes Tums.  She denies fevers, chills or other complaints.  Past Medical History:  Diagnosis Date  . Hypertension   . Obesity     There are no active problems to display for this patient.   Past Surgical History:  Procedure Laterality Date  . CESAREAN SECTION    . CESAREAN SECTION WITH BILATERAL TUBAL LIGATION Bilateral 08/05/2013   Procedure: REPEAT CESAREAN SECTION WITH BILATERAL TUBAL LIGATION;  Surgeon: Allie BossierMyra C Dove, MD;  Location: [facility];  Service: Obstetrics;  Laterality: Bilateral;  . TUBAL LIGATION      Allergies Banana and Tuna [fish allergy]  Social History Social History   Tobacco Use  . Smoking status: Never Smoker  . Smokeless tobacco: Never Used  Substance Use Topics  . Alcohol use: Yes    Comment: occasionally  . Drug use: No   Review of Systems Constitutional: Negative for fever. Cardiovascular: Negative for chest pain. Respiratory: Negative for shortness of breath. Gastrointestinal: Positive for abdominal pain Genitourinary: Negative for dysuria. Musculoskeletal: Negative for back pain. Skin: Negative for rash. Neurological: Negative for headaches, focal weakness or numbness.  All systems negative/normal/unremarkable except as stated  in the HPI  ____________________________________________   PHYSICAL EXAM:  VITAL SIGNS: ED Triage Vitals  Enc Vitals Group     BP 03/04/18 0805 (!) 124/93     Pulse Rate 03/04/18 0805 85     Resp 03/04/18 0805 18     Temp 03/04/18 0805 97.8 F (36.6 C)     Temp Source 03/04/18 0805 Oral     SpO2 03/04/18 0805 100 %     Weight 03/04/18 0806 200 lb (90.7 kg)     Height 03/04/18 0806 5\' 2"  (1.575 m)     Head Circumference --      Peak Flow --      Pain Score 03/04/18 0806 4     Pain Loc --      Pain Edu? --      Excl. in GC? --    Constitutional: Alert and oriented. Well appearing and in no distress. Eyes: Conjunctivae are normal. Normal extraocular movements. Cardiovascular: Normal rate, regular rhythm. No murmurs, rubs, or gallops. Respiratory: Normal respiratory effort without tachypnea nor retractions. Breath sounds are clear and equal bilaterally. No wheezes/rales/rhonchi. Gastrointestinal: Mild epigastric tenderness, no rebound or guarding.  Normal bowel sounds. Musculoskeletal: Nontender with normal range of motion in extremities. No lower extremity tenderness nor edema. Neurologic:  Normal speech and language. No gross focal neurologic deficits are appreciated.  Skin:  Skin is warm, dry and intact. No rash noted. Psychiatric: Mood and affect are normal. Speech and behavior are normal.  ____________________________________________  ED COURSE:  As part of my medical decision making, I reviewed the following data within the electronic MEDICAL RECORD NUMBER  History obtained from family if available, nursing notes, old chart and ekg, as well as notes from prior ED visits. Patient presented for abdominal pain, we will assess with labs and imaging as indicated at this time.   Procedures ____________________________________________   LABS (pertinent positives/negatives)  Labs Reviewed  COMPREHENSIVE METABOLIC PANEL - Abnormal; Notable for the following components:      Result  Value   BUN 21 (*)    Calcium 8.6 (*)    All other components within normal limits  URINALYSIS, COMPLETE (UACMP) WITH MICROSCOPIC - Abnormal; Notable for the following components:   Color, Urine YELLOW (*)    APPearance CLEAR (*)    Squamous Epithelial / LPF 0-5 (*)    All other components within normal limits  LIPASE, BLOOD  CBC  PREGNANCY, URINE  POC URINE PREG, ED   ____________________________________________  DIFFERENTIAL DIAGNOSIS   GERD, gastritis, peptic ulcer disease, cholelithiasis  FINAL ASSESSMENT AND PLAN  Epigastric pain   Plan: The patient had presented for epigastric pain likely secondary to GERD. Patient's labs are unremarkable.  Patient was given a GI cocktail here as well as Pepcid and Carafate.  She will continue home with Pepcid and Carafate and will be referred to GI for not improving.   Ulice Dash, MD   Note: This note was generated in part or whole with voice recognition software. Voice recognition is usually quite accurate but there are transcription errors that can and very often do occur. I apologize for any typographical errors that were not detected and corrected.     Emily Filbert, MD 03/04/18 1028

## 2018-03-04 NOTE — ED Notes (Signed)
Patient sleeping in WR, wakened to repeat BP - 125/68.

## 2018-03-04 NOTE — ED Triage Notes (Signed)
Here for upper abdominal pain that started upon waking today. Has eased some per pt. Has not eaten.  No hx of similar. Pain constant. No NVD. No fevers.  Color WNL .VSS

## 2018-03-04 NOTE — ED Notes (Signed)
Pt alert and oriented X4, active, cooperative, pt in NAD. RR even and unlabored, color WNL.  Pt informed to return if any life threatening symptoms occur.  Discharge and followup instructions reviewed.  

## 2018-03-06 LAB — POCT PREGNANCY, URINE: Preg Test, Ur: NEGATIVE

## 2018-03-09 ENCOUNTER — Other Ambulatory Visit: Payer: Self-pay

## 2018-03-09 ENCOUNTER — Encounter: Payer: Self-pay | Admitting: Certified Nurse Midwife

## 2018-03-09 MED ORDER — ACYCLOVIR 400 MG PO TABS: ORAL_TABLET | ORAL | 0 refills | Status: DC

## 2018-03-09 MED ORDER — ACYCLOVIR 800 MG PO TABS: 400.0000 mg | ORAL_TABLET | Freq: Every day | ORAL | 0 refills | Status: DC

## 2018-03-13 ENCOUNTER — Ambulatory Visit (INDEPENDENT_AMBULATORY_CARE_PROVIDER_SITE_OTHER): Payer: BLUE CROSS/BLUE SHIELD | Admitting: Obstetrics and Gynecology

## 2018-03-13 ENCOUNTER — Encounter: Payer: Self-pay | Admitting: Obstetrics and Gynecology

## 2018-03-13 VITALS — BP 133/90 | HR 77 | Ht 62.0 in | Wt 223.1 lb

## 2018-03-13 DIAGNOSIS — E669 Obesity, unspecified: Secondary | ICD-10-CM | POA: Diagnosis not present

## 2018-03-13 DIAGNOSIS — N912 Amenorrhea, unspecified: Secondary | ICD-10-CM

## 2018-03-13 NOTE — Progress Notes (Signed)
SUBJECTIVE:  32 y.o. here for follow-up weight loss visit, previously seen 4 weeks ago. Denies any concerns and feels like medication isn't working, exercising 5-6 days a week, and running. Has gained 11 lbs.  OBJECTIVE:  BP 133/90   Pulse 77   Ht 5\' 2"  (1.575 m)   Wt 223 lb 1.6 oz (101.2 kg)   BMI 40.81 kg/m   Body mass index is 40.81 kg/m. Patient appears well. ASSESSMENT:  Obesity- PLAN:  To continue with current medications. B12 103800mcg/ml injection given; labs obtained- will follow up accordingly. RTC in 4 weeks as planned for yearly  Deshay Kirstein RenoShambley, CNM

## 2018-03-14 LAB — LIPID PANEL
CHOLESTEROL TOTAL: 161 mg/dL (ref 100–199)
Chol/HDL Ratio: 2.6 ratio (ref 0.0–4.4)
HDL: 61 mg/dL (ref >39)
LDL Calculated: 89 mg/dL (ref 0–99)
TRIGLYCERIDES: 53 mg/dL (ref 0–149)
VLDL Cholesterol Cal: 11 mg/dL (ref 5–40)

## 2018-03-14 LAB — CBC
HEMATOCRIT: 39.7 % (ref 34.0–46.6)
Hemoglobin: 13.7 g/dL (ref 11.1–15.9)
MCH: 30.2 pg (ref 26.6–33.0)
MCHC: 34.5 g/dL (ref 31.5–35.7)
MCV: 88 fL (ref 79–97)
Platelets: 340 10*3/uL (ref 150–379)
RBC: 4.53 x10E6/uL (ref 3.77–5.28)
RDW: 13.9 % (ref 12.3–15.4)
WBC: 7.2 10*3/uL (ref 3.4–10.8)

## 2018-03-14 LAB — THYROID PANEL WITH TSH
FREE THYROXINE INDEX: 1.7 (ref 1.2–4.9)
T3 Uptake Ratio: 26 % (ref 24–39)
T4, Total: 6.5 ug/dL (ref 4.5–12.0)
TSH: 1.34 u[IU]/mL (ref 0.450–4.500)

## 2018-03-14 LAB — COMPREHENSIVE METABOLIC PANEL
A/G RATIO: 1.7 (ref 1.2–2.2)
ALT: 25 IU/L (ref 0–32)
AST: 19 IU/L (ref 0–40)
Albumin: 4.5 g/dL (ref 3.5–5.5)
Alkaline Phosphatase: 71 IU/L (ref 39–117)
BILIRUBIN TOTAL: 0.3 mg/dL (ref 0.0–1.2)
BUN/Creatinine Ratio: 16 (ref 9–23)
BUN: 13 mg/dL (ref 6–20)
CALCIUM: 9.3 mg/dL (ref 8.7–10.2)
CHLORIDE: 105 mmol/L (ref 96–106)
CO2: 23 mmol/L (ref 20–29)
Creatinine, Ser: 0.82 mg/dL (ref 0.57–1.00)
GFR, EST AFRICAN AMERICAN: 112 mL/min/{1.73_m2} (ref >59)
GFR, EST NON AFRICAN AMERICAN: 97 mL/min/{1.73_m2} (ref >59)
GLOBULIN, TOTAL: 2.6 g/dL (ref 1.5–4.5)
Glucose: 83 mg/dL (ref 65–99)
POTASSIUM: 4.4 mmol/L (ref 3.5–5.2)
SODIUM: 141 mmol/L (ref 134–144)
TOTAL PROTEIN: 7.1 g/dL (ref 6.0–8.5)

## 2018-03-14 LAB — FSH/LH
FSH: 6.6 m[IU]/mL
LH: 8.8 m[IU]/mL

## 2018-03-14 LAB — INSULIN, RANDOM: INSULIN: 9.2 u[IU]/mL (ref 2.6–24.9)

## 2018-03-14 LAB — HEMOGLOBIN A1C
Est. average glucose Bld gHb Est-mCnc: 100 mg/dL
Hgb A1c MFr Bld: 5.1 % (ref 4.8–5.6)

## 2018-03-14 LAB — FERRITIN: Ferritin: 52 ng/mL (ref 15–150)

## 2018-03-14 LAB — TESTOSTERONE, FREE, TOTAL, SHBG
SEX HORMONE BINDING: 56.7 nmol/L (ref 24.6–122.0)
TESTOSTERONE FREE: 0.7 pg/mL (ref 0.0–4.2)
TESTOSTERONE: 12 ng/dL (ref 8–48)

## 2018-03-14 LAB — ESTRADIOL: Estradiol: 32.6 pg/mL

## 2018-03-17 ENCOUNTER — Encounter: Payer: Self-pay | Admitting: Obstetrics and Gynecology

## 2018-04-10 ENCOUNTER — Encounter: Payer: Self-pay | Admitting: Obstetrics and Gynecology

## 2018-04-10 ENCOUNTER — Ambulatory Visit (INDEPENDENT_AMBULATORY_CARE_PROVIDER_SITE_OTHER): Payer: BLUE CROSS/BLUE SHIELD | Admitting: Obstetrics and Gynecology

## 2018-04-10 VITALS — BP 122/84 | HR 97 | Ht 62.0 in | Wt 210.0 lb

## 2018-04-10 DIAGNOSIS — Z01419 Encounter for gynecological examination (general) (routine) without abnormal findings: Secondary | ICD-10-CM

## 2018-04-10 NOTE — Patient Instructions (Signed)
Preventive Care 18-39 Years, Female Preventive care refers to lifestyle choices and visits with your health care provider that can promote health and wellness. What does preventive care include?  A yearly physical exam. This is also called an annual well check.  Dental exams once or twice a year.  Routine eye exams. Ask your health care provider how often you should have your eyes checked.  Personal lifestyle choices, including: ? Daily care of your teeth and gums. ? Regular physical activity. ? Eating a healthy diet. ? Avoiding tobacco and drug use. ? Limiting alcohol use. ? Practicing safe sex. ? Taking vitamin and mineral supplements as recommended by your health care provider. What happens during an annual well check? The services and screenings done by your health care provider during your annual well check will depend on your age, overall health, lifestyle risk factors, and family history of disease. Counseling Your health care provider may ask you questions about your:  Alcohol use.  Tobacco use.  Drug use.  Emotional well-being.  Home and relationship well-being.  Sexual activity.  Eating habits.  Work and work Statistician.  Method of birth control.  Menstrual cycle.  Pregnancy history.  Screening You may have the following tests or measurements:  Height, weight, and BMI.  Diabetes screening. This is done by checking your blood sugar (glucose) after you have not eaten for a while (fasting).  Blood pressure.  Lipid and cholesterol levels. These may be checked every 5 years starting at age 38.  Skin check.  Hepatitis C blood test.  Hepatitis B blood test.  Sexually transmitted disease (STD) testing.  BRCA-related cancer screening. This may be done if you have a family history of breast, ovarian, tubal, or peritoneal cancers.  Pelvic exam and Pap test. This may be done every 3 years starting at age 38. Starting at age 30, this may be done  every 5 years if you have a Pap test in combination with an HPV test.  Discuss your test results, treatment options, and if necessary, the need for more tests with your health care provider. Vaccines Your health care provider may recommend certain vaccines, such as:  Influenza vaccine. This is recommended every year.  Tetanus, diphtheria, and acellular pertussis (Tdap, Td) vaccine. You may need a Td booster every 10 years.  Varicella vaccine. You may need this if you have not been vaccinated.  HPV vaccine. If you are 39 or younger, you may need three doses over 6 months.  Measles, mumps, and rubella (MMR) vaccine. You may need at least one dose of MMR. You may also need a second dose.  Pneumococcal 13-valent conjugate (PCV13) vaccine. You may need this if you have certain conditions and were not previously vaccinated.  Pneumococcal polysaccharide (PPSV23) vaccine. You may need one or two doses if you smoke cigarettes or if you have certain conditions.  Meningococcal vaccine. One dose is recommended if you are age 68-21 years and a first-year college student living in a residence hall, or if you have one of several medical conditions. You may also need additional booster doses.  Hepatitis A vaccine. You may need this if you have certain conditions or if you travel or work in places where you may be exposed to hepatitis A.  Hepatitis B vaccine. You may need this if you have certain conditions or if you travel or work in places where you may be exposed to hepatitis B.  Haemophilus influenzae type b (Hib) vaccine. You may need this  if you have certain risk factors.  Talk to your health care provider about which screenings and vaccines you need and how often you need them. This information is not intended to replace advice given to you by your health care provider. Make sure you discuss any questions you have with your health care provider. Document Released: 12/31/2001 Document Revised:  07/24/2016 Document Reviewed: 09/05/2015 Elsevier Interactive Patient Education  2018 Elsevier Inc.  

## 2018-04-10 NOTE — Progress Notes (Signed)
  Subjective:     Sarah Eaton is a white divorced 32 y.o. female and is here for a comprehensive physical exam. Is exercising 5 days a week and lost 10# since last month. Is sexually active with female partner.The patient reports no problems.  Social History   Socioeconomic History  . Marital status: Married    Spouse name: Not on file  . Number of children: Not on file  . Years of education: Not on file  . Highest education level: Not on file  Occupational History  . Not on file  Social Needs  . Financial resource strain: Not on file  . Food insecurity:    Worry: Not on file    Inability: Not on file  . Transportation needs:    Medical: Not on file    Non-medical: Not on file  Tobacco Use  . Smoking status: Never Smoker  . Smokeless tobacco: Never Used  Substance and Sexual Activity  . Alcohol use: Yes    Comment: occasionally  . Drug use: No  . Sexual activity: Yes    Birth control/protection: None  Lifestyle  . Physical activity:    Days per week: Not on file    Minutes per session: Not on file  . Stress: Not on file  Relationships  . Social connections:    Talks on phone: Not on file    Gets together: Not on file    Attends religious service: Not on file    Active member of club or organization: Not on file    Attends meetings of clubs or organizations: Not on file    Relationship status: Not on file  . Intimate partner violence:    Fear of current or ex partner: Not on file    Emotionally abused: Not on file    Physically abused: Not on file    Forced sexual activity: Not on file  Other Topics Concern  . Not on file  Social History Narrative  . Not on file   Health Maintenance  Topic Date Due  . INFLUENZA VACCINE  06/18/2018  . PAP SMEAR  04/02/2020  . TETANUS/TDAP  08/06/2023  . HIV Screening  Completed    The following portions of the patient's history were reviewed and updated as appropriate: allergies, current medications, past family history,  past medical history, past social history, past surgical history and problem list.  Review of Systems Pertinent items noted in HPI and remainder of comprehensive ROS otherwise negative.   Objective:    General appearance: alert, cooperative and appears stated age Neck: no adenopathy, no carotid bruit, no JVD, supple, symmetrical, trachea midline and thyroid not enlarged, symmetric, no tenderness/mass/nodules Lungs: clear to auscultation bilaterally Breasts: normal appearance, no masses or tenderness Heart: regular rate and rhythm, S1, S2 normal, no murmur, click, rub or gallop Abdomen: soft, non-tender; bowel sounds normal; no masses,  no organomegaly Pelvic: cervix normal in appearance, external genitalia normal, no adnexal masses or tenderness, no cervical motion tenderness, rectovaginal septum normal, uterus normal size, shape, and consistency and vagina normal without discharge    Assessment:    Healthy female exam. obesity     Plan:   B12 injection given RTC 1 month for wt/bp/B12 RTC 1 year for AE or as needed. Melody shambley,CNM   See After Visit Summary for Counseling Recommendations

## 2018-05-10 ENCOUNTER — Encounter: Payer: Self-pay | Admitting: Obstetrics and Gynecology

## 2018-05-12 ENCOUNTER — Other Ambulatory Visit: Payer: BLUE CROSS/BLUE SHIELD

## 2018-05-12 ENCOUNTER — Other Ambulatory Visit: Payer: Self-pay | Admitting: *Deleted

## 2018-05-12 DIAGNOSIS — R300 Dysuria: Secondary | ICD-10-CM

## 2018-05-12 MED ORDER — NITROFURANTOIN MONOHYD MACRO 100 MG PO CAPS: 100.0000 mg | ORAL_CAPSULE | Freq: Two times a day (BID) | ORAL | 0 refills | Status: DC

## 2018-05-14 LAB — URINE CULTURE

## 2018-05-18 ENCOUNTER — Ambulatory Visit (INDEPENDENT_AMBULATORY_CARE_PROVIDER_SITE_OTHER): Payer: BLUE CROSS/BLUE SHIELD | Admitting: Obstetrics and Gynecology

## 2018-05-18 ENCOUNTER — Encounter: Payer: Self-pay | Admitting: Obstetrics and Gynecology

## 2018-05-18 VITALS — BP 133/92 | HR 87 | Ht 62.0 in | Wt 207.9 lb

## 2018-05-18 DIAGNOSIS — E669 Obesity, unspecified: Secondary | ICD-10-CM

## 2018-05-18 MED ORDER — CYANOCOBALAMIN 1000 MCG/ML IJ SOLN
1000.0000 ug | Freq: Once | INTRAMUSCULAR | Status: AC
  Administered 2018-05-18: 1000 ug via INTRAMUSCULAR

## 2018-05-18 MED ORDER — PHENTERMINE HCL 37.5 MG PO TABS: 37.5000 mg | ORAL_TABLET | Freq: Every day | ORAL | 2 refills | Status: DC

## 2018-05-18 NOTE — Progress Notes (Signed)
Pt is here for wt, bp check, b-12 inj She is doing well, denies any s/e  05/18/18 wt- 207.9lb 04/10/18 wt- 210lb 03/13/18 wt- 223lb

## 2018-05-28 ENCOUNTER — Encounter: Payer: Self-pay | Admitting: Obstetrics and Gynecology

## 2018-06-15 ENCOUNTER — Ambulatory Visit (INDEPENDENT_AMBULATORY_CARE_PROVIDER_SITE_OTHER): Payer: BLUE CROSS/BLUE SHIELD | Admitting: Obstetrics and Gynecology

## 2018-06-15 VITALS — BP 124/81 | HR 84 | Ht 62.0 in | Wt 209.8 lb

## 2018-06-15 DIAGNOSIS — E669 Obesity, unspecified: Secondary | ICD-10-CM | POA: Diagnosis not present

## 2018-06-15 MED ORDER — CYANOCOBALAMIN 1000 MCG/ML IJ SOLN
1000.0000 ug | Freq: Once | INTRAMUSCULAR | Status: AC
  Administered 2018-06-15: 1000 ug via INTRAMUSCULAR

## 2018-06-15 NOTE — Progress Notes (Signed)
Pt is here for wt, bp check, b-12 inj She is doing well, denies any s/e  06/15/18 wt- 209.8lb 05/18/18 wt- 207lb

## 2018-07-09 ENCOUNTER — Ambulatory Visit (INDEPENDENT_AMBULATORY_CARE_PROVIDER_SITE_OTHER): Payer: BLUE CROSS/BLUE SHIELD | Admitting: Obstetrics and Gynecology

## 2018-07-09 ENCOUNTER — Encounter: Payer: Self-pay | Admitting: Obstetrics and Gynecology

## 2018-07-09 VITALS — BP 123/84 | HR 95 | Ht 62.0 in | Wt 198.6 lb

## 2018-07-09 DIAGNOSIS — E669 Obesity, unspecified: Secondary | ICD-10-CM | POA: Diagnosis not present

## 2018-07-09 MED ORDER — CYANOCOBALAMIN 1000 MCG/ML IJ SOLN
1000.0000 ug | Freq: Once | INTRAMUSCULAR | Status: AC
Start: 2018-07-09 — End: 2018-07-09
  Administered 2018-07-09: 1000 ug via INTRAMUSCULAR

## 2018-07-09 NOTE — Progress Notes (Signed)
Pt is here for wt, bp check, b-12 inj She did very well this month, has been exercising a lot and has lost 11lbs  07/09/18 wt- 198.6lb 06/15/18 wt- 209lb

## 2018-07-13 ENCOUNTER — Ambulatory Visit: Payer: BLUE CROSS/BLUE SHIELD | Admitting: Obstetrics and Gynecology

## 2018-08-05 ENCOUNTER — Ambulatory Visit (INDEPENDENT_AMBULATORY_CARE_PROVIDER_SITE_OTHER): Payer: BLUE CROSS/BLUE SHIELD | Admitting: Obstetrics and Gynecology

## 2018-08-05 VITALS — BP 116/89 | HR 82 | Ht 62.0 in | Wt 202.0 lb

## 2018-08-05 DIAGNOSIS — E669 Obesity, unspecified: Secondary | ICD-10-CM

## 2018-08-05 DIAGNOSIS — Z79899 Other long term (current) drug therapy: Secondary | ICD-10-CM

## 2018-08-05 DIAGNOSIS — Z6836 Body mass index (BMI) 36.0-36.9, adult: Secondary | ICD-10-CM

## 2018-08-05 MED ORDER — PHENTERMINE HCL 37.5 MG PO TABS: 37.5000 mg | ORAL_TABLET | Freq: Every day | ORAL | 2 refills | Status: DC

## 2018-08-05 MED ORDER — CYANOCOBALAMIN 1000 MCG/ML IJ SOLN: 1000.0000 ug | INTRAMUSCULAR | 1 refills | Status: DC

## 2018-08-05 NOTE — Progress Notes (Signed)
Pt presents for  Weight,B/P, B-12 injection. No side effects of medications-Phentermine or B-12. Weight gain __4__ lbs. Encourage eating healthy and exercise.

## 2018-08-05 NOTE — Progress Notes (Signed)
SUBJECTIVE:  32 y.o. here for follow-up weight loss visit, previously seen 4 weeks ago. Denies any concerns and feels like medication is still working well, as she lost 11 lbs the second month, although she put 4 lbs back on(she was not exercising and watching diet the last two weeks). She desires to restart it. She has been off of adipex for one month.   OBJECTIVE:  BP 116/89   Pulse 82   Ht 5\' 2"  (1.575 m)   Wt 202 lb (91.6 kg)   BMI 36.95 kg/m   Body mass index is 36.95 kg/m. Patient appears well.  ASSESSMENT:  Obesity- responding well to weight loss plan PLAN:  To continue with current medications. B12 1010300mcg/ml injection given RTC in 4 weeks as planned  Khadeejah Castner TamiamiShambley, CNM

## 2018-08-31 ENCOUNTER — Ambulatory Visit (INDEPENDENT_AMBULATORY_CARE_PROVIDER_SITE_OTHER): Payer: BLUE CROSS/BLUE SHIELD | Admitting: Obstetrics and Gynecology

## 2018-08-31 VITALS — BP 105/78 | HR 89 | Ht 65.0 in | Wt 196.2 lb

## 2018-08-31 DIAGNOSIS — Z79899 Other long term (current) drug therapy: Secondary | ICD-10-CM

## 2018-08-31 DIAGNOSIS — E663 Overweight: Secondary | ICD-10-CM | POA: Diagnosis not present

## 2018-08-31 NOTE — Progress Notes (Signed)
Pt presents for  Weight,B/P, B-12 injection. No side effects of medications-Phentermine or B-12. Weight loss _5.7___ lbs. Encourage eating healthy and exercise.

## 2018-09-30 ENCOUNTER — Encounter: Payer: BLUE CROSS/BLUE SHIELD | Admitting: Obstetrics and Gynecology

## 2018-10-01 ENCOUNTER — Encounter: Payer: BLUE CROSS/BLUE SHIELD | Admitting: Obstetrics and Gynecology

## 2018-10-28 ENCOUNTER — Ambulatory Visit: Payer: BLUE CROSS/BLUE SHIELD | Admitting: Obstetrics and Gynecology

## 2018-10-28 ENCOUNTER — Encounter: Payer: Self-pay | Admitting: Obstetrics and Gynecology

## 2018-10-28 VITALS — BP 141/87 | HR 106 | Ht 65.0 in | Wt 194.6 lb

## 2018-10-28 DIAGNOSIS — E669 Obesity, unspecified: Secondary | ICD-10-CM | POA: Diagnosis not present

## 2018-10-28 MED ORDER — CYANOCOBALAMIN 1000 MCG/ML IJ SOLN
1000.0000 ug | Freq: Once | INTRAMUSCULAR | Status: AC
  Administered 2018-10-28: 1000 ug via INTRAMUSCULAR

## 2018-10-28 NOTE — Progress Notes (Signed)
Pt is here for wt, bp check, b-12 inj She is doing well, denies any s/e  10/28/18 wt- 194.6lb 08/31/18 wt- 196lb  Waist 31.5 in

## 2018-11-04 ENCOUNTER — Other Ambulatory Visit: Payer: Self-pay | Admitting: Obstetrics and Gynecology

## 2018-11-18 ENCOUNTER — Emergency Department (HOSPITAL_COMMUNITY): Payer: BLUE CROSS/BLUE SHIELD

## 2018-11-18 ENCOUNTER — Other Ambulatory Visit: Payer: Self-pay

## 2018-11-18 ENCOUNTER — Encounter (HOSPITAL_COMMUNITY): Payer: Self-pay | Admitting: Emergency Medicine

## 2018-11-18 ENCOUNTER — Emergency Department (HOSPITAL_COMMUNITY)
Admission: EM | Admit: 2018-11-18 | Discharge: 2018-11-18 | Disposition: A | Discharge status: Home / Self Care | Payer: BLUE CROSS/BLUE SHIELD | Attending: Emergency Medicine | Admitting: Emergency Medicine

## 2018-11-18 DIAGNOSIS — R05 Cough: Secondary | ICD-10-CM | POA: Diagnosis present

## 2018-11-18 DIAGNOSIS — J9801 Acute bronchospasm: Secondary | ICD-10-CM | POA: Diagnosis not present

## 2018-11-18 DIAGNOSIS — J40 Bronchitis, not specified as acute or chronic: Secondary | ICD-10-CM | POA: Insufficient documentation

## 2018-11-18 DIAGNOSIS — Z79899 Other long term (current) drug therapy: Secondary | ICD-10-CM | POA: Diagnosis not present

## 2018-11-18 DIAGNOSIS — J209 Acute bronchitis, unspecified: Secondary | ICD-10-CM

## 2018-11-18 DIAGNOSIS — I10 Essential (primary) hypertension: Secondary | ICD-10-CM | POA: Diagnosis not present

## 2018-11-18 MED ORDER — ALBUTEROL SULFATE (2.5 MG/3ML) 0.083% IN NEBU
5.0000 mg | INHALATION_SOLUTION | Freq: Once | RESPIRATORY_TRACT | Status: AC
  Administered 2018-11-18: 5 mg via RESPIRATORY_TRACT
  Filled 2018-11-18: qty 6

## 2018-11-18 MED ORDER — ALBUTEROL SULFATE HFA 108 (90 BASE) MCG/ACT IN AERS
1.0000 | INHALATION_SPRAY | RESPIRATORY_TRACT | Status: DC | PRN
  Administered 2018-11-18: 2 via RESPIRATORY_TRACT
  Filled 2018-11-18: qty 6.7

## 2018-11-18 MED ORDER — PREDNISONE 20 MG PO TABS: ORAL_TABLET | ORAL | 0 refills | Status: DC

## 2018-11-18 MED ORDER — BENZONATATE 100 MG PO CAPS: 100.0000 mg | ORAL_CAPSULE | Freq: Three times a day (TID) | ORAL | 0 refills | Status: DC | PRN

## 2018-11-18 MED ORDER — METHYLPREDNISOLONE SODIUM SUCC 125 MG IJ SOLR
125.0000 mg | Freq: Once | INTRAMUSCULAR | Status: AC
  Administered 2018-11-18: 125 mg via INTRAMUSCULAR
  Filled 2018-11-18: qty 2

## 2018-11-18 NOTE — ED Provider Notes (Signed)
[facility] Provider [facility]   CSN: [id number] Arrival date & time: 11/18/18  1422     History   Chief Complaint Chief Complaint  Patient presents with  . Cough    HPI Sarah MedinaJulie C Siebel is a 33 y.o. female.  HPI Patient states she was diagnosed with the flu last week.  Was started on antibiotic and prednisone taper.  Since that time she is had persistent dry nonproductive cough, chest tightness and shortness of breath.  Denies any fever or chills.  No new lower extremity swelling or pain. Past Medical History:  Diagnosis Date  . Hypertension   . Obesity     There are no active problems to display for this patient.   Past Surgical History:  Procedure Laterality Date  . CESAREAN SECTION    . CESAREAN SECTION WITH BILATERAL TUBAL LIGATION Bilateral 08/05/2013   Procedure: REPEAT CESAREAN SECTION WITH BILATERAL TUBAL LIGATION;  Surgeon: Allie BossierMyra C Dove, MD;  Location: [facility];  Service: Obstetrics;  Laterality: Bilateral;  . TUBAL LIGATION       OB History    Gravida  2   Para  2   Term  2   Preterm      AB      Living  2     SAB      TAB      Ectopic      Multiple      Live Births  2            Home Medications    Prior to Admission medications   Medication Sig Start Date End Date Taking? Authorizing Provider  acyclovir (ZOVIRAX) 400 MG tablet Take 5 tablets daily. 03/09/18   Lawhorn, Vanessa DurhamJenkins Michelle, CNM  benzonatate (TESSALON) 100 MG capsule Take 1 capsule (100 mg total) by mouth 3 (three) times daily as needed for cough. 11/18/18   Loren RacerYelverton, Requan Hardge, MD  buPROPion (WELLBUTRIN) 100 MG tablet TAKE 1 TABLET BY MOUTH EVERY DAY Patient not taking: Reported on 11/19/2017 10/14/17   Shambley, Melody N, CNM  cyanocobalamin (,VITAMIN B-12,) 1000 MCG/ML injection Inject 1 mL (1,000 mcg total) into the muscle every 30 (thirty) days. 08/05/18   Shambley, Melody N, CNM  famotidine (PEPCID) 20 MG tablet Take 1 tablet (20 mg total) by mouth 2 (two)  times daily. 03/04/18   Emily FilbertWilliams, Jonathan E, MD  nitrofurantoin, macrocrystal-monohydrate, (MACROBID) 100 MG capsule Take 1 capsule (100 mg total) by mouth 2 (two) times daily. Patient not taking: Reported on 05/18/2018 05/12/18   Purcell NailsShambley, Melody N, CNM  phentermine (ADIPEX-P) 37.5 MG tablet Take 1 tablet (37.5 mg total) by mouth daily before breakfast. 08/05/18   Shambley, Melody N, CNM  predniSONE (DELTASONE) 20 MG tablet 3 tabs po day one, then 2 po daily x 4 days 11/19/18   Loren RacerYelverton, Madline Oesterling, MD  rizatriptan (MAXALT) 10 MG tablet Take 1 tablet (10 mg total) by mouth as needed for migraine. May repeat in 2 hours if needed 11/19/17   Shambley, Melody N, CNM  sucralfate (CARAFATE) 1 g tablet Take 1 tablet (1 g total) by mouth 4 (four) times daily. Patient not taking: Reported on 08/05/2018 03/04/18 03/04/19  Emily FilbertWilliams, Jonathan E, MD  topiramate (TOPAMAX) 100 MG tablet TAKE 1 TABLET BY MOUTH TWICE A DAY 11/04/18   Shambley, Melody N, CNM  zolpidem (AMBIEN) 10 MG tablet Take 1 tablet (10 mg total) by mouth at bedtime as needed for sleep. 03/04/18   Emily FilbertWilliams, Jonathan E, MD  Family History Family History  Problem Relation Age of Onset  . Cancer Other        breast  . Hypertension Mother   . Cancer Mother        skin  . Thyroid disease Mother   . Cancer Maternal Grandmother   . Hypertension Maternal Grandmother   . Thyroid disease Father   . Thyroid disease Paternal Grandmother     Social History Social History   Tobacco Use  . Smoking status: Never Smoker  . Smokeless tobacco: Never Used  Substance Use Topics  . Alcohol use: Yes    Comment: occasionally  . Drug use: No     Allergies   Banana and Tuna [fish allergy]   Review of Systems Review of Systems  Constitutional: Negative for chills and fever.  HENT: Negative for sore throat and trouble swallowing.   Eyes: Negative for visual disturbance.  Respiratory: Positive for cough, chest tightness and shortness of breath.     Cardiovascular: Negative for chest pain, palpitations and leg swelling.  Gastrointestinal: Negative for abdominal pain, diarrhea, nausea and vomiting.  Genitourinary: Negative for dysuria and flank pain.  Musculoskeletal: Negative for back pain, myalgias and neck pain.  Skin: Negative for rash and wound.  Neurological: Negative for dizziness, weakness, light-headedness, numbness and headaches.  All other systems reviewed and are negative.    Physical Exam Updated Vital Signs BP 123/81 (BP Location: Right Arm)   Pulse (!) 111   Temp 98.6 F (37 C) (Oral)   Resp 18   Ht 5\' 3"  (1.6 m)   Wt 87.5 kg   SpO2 96%   BMI 34.19 kg/m   Physical Exam Vitals signs and nursing note reviewed.  Constitutional:      General: She is not in acute distress.    Appearance: Normal appearance. She is well-developed. She is not ill-appearing.  HENT:     Head: Normocephalic and atraumatic.     Nose: Nose normal. No congestion.     Mouth/Throat:     Mouth: Mucous membranes are moist.     Pharynx: No oropharyngeal exudate or posterior oropharyngeal erythema.  Eyes:     Extraocular Movements: Extraocular movements intact.     Pupils: Pupils are equal, round, and reactive to light.  Neck:     Musculoskeletal: Normal range of motion and neck supple.  Cardiovascular:     Rate and Rhythm: Normal rate and regular rhythm.     Heart sounds: No murmur. No friction rub. No gallop.   Pulmonary:     Effort: Pulmonary effort is normal.     Comments: Diminished breath sounds bilateral bases. Abdominal:     General: Bowel sounds are normal.     Palpations: Abdomen is soft.     Tenderness: There is no abdominal tenderness. There is no guarding or rebound.  Musculoskeletal: Normal range of motion.        General: No tenderness.     Comments: No midline thoracic or lumbar tenderness.  No lower extremity swelling, asymmetry or tenderness.  Distal pulses intact.  Skin:    General: Skin is warm and dry.      Capillary Refill: Capillary refill takes less than 2 seconds.     Findings: No erythema or rash.  Neurological:     General: No focal deficit present.     Mental Status: She is alert and oriented to person, place, and time.  Psychiatric:        Mood and Affect: Mood  normal.        Behavior: Behavior normal.      ED Treatments / Results  Labs (all labs ordered are listed, but only abnormal results are displayed) Labs Reviewed - No data to display  EKG None  Radiology Dg Chest 2 View  Result Date: 11/18/2018 CLINICAL DATA:  33 year old female with a history of shortness of breath. EXAM: CHEST - 2 VIEW COMPARISON:  Multiple prior most recent 04/08/2016, 06/11/2014 FINDINGS: Cardiomediastinal silhouette unchanged in size and contour. No evidence of central vascular congestion. No interlobular septal thickening. No confluent airspace disease. No pleural effusion or pneumothorax. No displaced fracture. IMPRESSION: Negative for acute cardiopulmonary disease Electronically Signed   By: Gilmer Mor D.O.   On: 11/18/2018 17:20    Procedures Procedures (including critical care time)  Medications Ordered in ED Medications  albuterol (PROVENTIL HFA;VENTOLIN HFA) 108 (90 Base) MCG/ACT inhaler 1-2 puff (has no administration in time range)  methylPREDNISolone sodium succinate (SOLU-MEDROL) 125 mg/2 mL injection 125 mg (125 mg Intramuscular Given 11/18/18 1622)  albuterol (PROVENTIL) (2.5 MG/3ML) 0.083% nebulizer solution 5 mg (5 mg Nebulization Given 11/18/18 1636)     Initial Impression / Assessment and Plan / ED Course  I have reviewed the triage vital signs and the nursing notes.  Pertinent labs & imaging results that were available during my care of the patient were reviewed by me and considered in my medical decision making (see chart for details).    Patient states she is feeling better after nebulized treatment.  Improved air movement in the bases.  Chest x-ray without acute findings.   Will extend course of steroids and give MDI in the emergency department.  Follow-up with her primary physician and return precautions given.   Final Clinical Impressions(s) / ED Diagnoses   Final diagnoses:  Bronchitis with bronchospasm    ED Discharge Orders         Ordered    predniSONE (DELTASONE) 20 MG tablet     11/18/18 1759    benzonatate (TESSALON) 100 MG capsule  3 times daily PRN     11/18/18 1759           Loren Racer, MD 11/18/18 1801

## 2018-11-18 NOTE — ED Triage Notes (Signed)
Pt states she had the flu last week and continues to cough and have some trouble breathing.

## 2018-11-26 ENCOUNTER — Ambulatory Visit: Payer: BLUE CROSS/BLUE SHIELD | Admitting: Obstetrics and Gynecology

## 2018-11-26 ENCOUNTER — Encounter: Payer: Self-pay | Admitting: Obstetrics and Gynecology

## 2018-11-26 VITALS — BP 121/88 | HR 94 | Ht 62.0 in | Wt 188.7 lb

## 2018-11-26 DIAGNOSIS — E669 Obesity, unspecified: Secondary | ICD-10-CM | POA: Diagnosis not present

## 2018-11-26 MED ORDER — ZOLPIDEM TARTRATE 10 MG PO TABS: 10.0000 mg | ORAL_TABLET | Freq: Every evening | ORAL | 0 refills | Status: DC | PRN

## 2018-11-26 MED ORDER — CYANOCOBALAMIN 1000 MCG/ML IJ SOLN
1000.0000 ug | Freq: Once | INTRAMUSCULAR | Status: AC
  Administered 2018-11-26: 1000 ug via INTRAMUSCULAR

## 2018-11-26 MED ORDER — CYANOCOBALAMIN 1000 MCG/ML IJ SOLN: 1000.0000 ug | INTRAMUSCULAR | 1 refills | Status: DC

## 2018-11-26 MED ORDER — PHENTERMINE HCL 37.5 MG PO TABS: 37.5000 mg | ORAL_TABLET | Freq: Every day | ORAL | 1 refills | Status: DC

## 2018-11-26 NOTE — Progress Notes (Signed)
Pt is here for weight management, she is doing well,desires a refill of phentermine  11/26/18 wt- 188.7lb 10/28/18 wt- 194lb (waist 31.5) 08/31/18 196.3 lb  Waist 31  Congratulated on continued weight loss, meds refilled and will RTC 4 weeks. B12 injection given.  Melody Shambley,CNM

## 2018-12-08 ENCOUNTER — Other Ambulatory Visit: Payer: Self-pay | Admitting: Obstetrics and Gynecology

## 2018-12-29 ENCOUNTER — Ambulatory Visit (INDEPENDENT_AMBULATORY_CARE_PROVIDER_SITE_OTHER): Payer: BLUE CROSS/BLUE SHIELD | Admitting: Obstetrics and Gynecology

## 2018-12-29 ENCOUNTER — Encounter: Payer: Self-pay | Admitting: Obstetrics and Gynecology

## 2018-12-29 VITALS — BP 132/74 | HR 98 | Ht 62.0 in | Wt 183.0 lb

## 2018-12-29 DIAGNOSIS — E669 Obesity, unspecified: Secondary | ICD-10-CM

## 2018-12-29 DIAGNOSIS — Z6833 Body mass index (BMI) 33.0-33.9, adult: Secondary | ICD-10-CM

## 2018-12-29 MED ORDER — CYANOCOBALAMIN 1000 MCG/ML IJ SOLN
1000.0000 ug | Freq: Once | INTRAMUSCULAR | Status: AC
  Administered 2018-12-29: 1000 ug via INTRAMUSCULAR

## 2018-12-29 MED ORDER — FAMOTIDINE 20 MG PO TABS: 20.0000 mg | ORAL_TABLET | Freq: Two times a day (BID) | ORAL | 1 refills | Status: DC

## 2018-12-29 NOTE — Progress Notes (Signed)
Pt is here for wt, bp check, b-12 inj She is doing well, denies any s/e   12/29/18 wt- 183lb 11/26/18 wt- 188lb  Waist 29.5 in

## 2018-12-29 NOTE — Addendum Note (Signed)
Addended by: Rosine Beat L on: 12/29/2018 03:32 PM   Modules accepted: Orders

## 2019-01-27 ENCOUNTER — Encounter: Payer: Self-pay | Admitting: Obstetrics and Gynecology

## 2019-01-27 ENCOUNTER — Other Ambulatory Visit: Payer: Self-pay

## 2019-01-27 ENCOUNTER — Ambulatory Visit: Payer: BLUE CROSS/BLUE SHIELD | Admitting: Obstetrics and Gynecology

## 2019-01-27 VITALS — BP 118/89 | HR 74 | Ht 62.0 in | Wt 177.2 lb

## 2019-01-27 DIAGNOSIS — Z79899 Other long term (current) drug therapy: Secondary | ICD-10-CM

## 2019-01-27 DIAGNOSIS — Z6833 Body mass index (BMI) 33.0-33.9, adult: Secondary | ICD-10-CM

## 2019-01-27 MED ORDER — PHENTERMINE HCL 37.5 MG PO TABS: 37.5000 mg | ORAL_TABLET | Freq: Every day | ORAL | 1 refills | Status: DC

## 2019-01-27 MED ORDER — CYANOCOBALAMIN 1000 MCG/ML IJ SOLN
1000.0000 ug | Freq: Once | INTRAMUSCULAR | Status: AC
  Administered 2019-01-27: 1000 ug via INTRAMUSCULAR

## 2019-01-27 NOTE — Progress Notes (Signed)
Pt is here for wt, bp check, b-12 inj She is doing well  01/27/19 wt- 177.2lb 12/29/18 wt- 183lb  Waist 28 in(prev. 31.5 in)  Congratulated on continued weight loss. Phentermine refilled and will continue after taking a week off meds. RTC in 5 weeks for next weight check. B12 injection given.  Melody Shambley,CNM

## 2019-02-19 ENCOUNTER — Encounter: Payer: Self-pay | Admitting: *Deleted

## 2019-03-02 ENCOUNTER — Ambulatory Visit: Payer: Self-pay | Admitting: Obstetrics and Gynecology

## 2019-03-03 ENCOUNTER — Ambulatory Visit: Payer: BLUE CROSS/BLUE SHIELD

## 2019-03-19 ENCOUNTER — Other Ambulatory Visit: Payer: Self-pay | Admitting: *Deleted

## 2019-03-19 MED ORDER — ACYCLOVIR 400 MG PO TABS: ORAL_TABLET | ORAL | 1 refills | Status: DC

## 2019-04-13 ENCOUNTER — Encounter: Payer: BLUE CROSS/BLUE SHIELD | Admitting: Obstetrics and Gynecology

## 2019-05-03 ENCOUNTER — Other Ambulatory Visit: Payer: Self-pay

## 2019-05-03 ENCOUNTER — Encounter: Payer: Self-pay | Admitting: Emergency Medicine

## 2019-05-03 ENCOUNTER — Emergency Department: Payer: BC Managed Care – PPO

## 2019-05-03 ENCOUNTER — Emergency Department
Admission: EM | Admit: 2019-05-03 | Discharge: 2019-05-03 | Disposition: A | Discharge status: Home / Self Care | Payer: BC Managed Care – PPO | Attending: Emergency Medicine | Admitting: Emergency Medicine

## 2019-05-03 DIAGNOSIS — M7918 Myalgia, other site: Secondary | ICD-10-CM

## 2019-05-03 DIAGNOSIS — Y929 Unspecified place or not applicable: Secondary | ICD-10-CM | POA: Diagnosis not present

## 2019-05-03 DIAGNOSIS — M545 Low back pain, unspecified: Secondary | ICD-10-CM

## 2019-05-03 DIAGNOSIS — Y9389 Activity, other specified: Secondary | ICD-10-CM | POA: Diagnosis not present

## 2019-05-03 DIAGNOSIS — Z79899 Other long term (current) drug therapy: Secondary | ICD-10-CM | POA: Diagnosis not present

## 2019-05-03 DIAGNOSIS — Y998 Other external cause status: Secondary | ICD-10-CM | POA: Insufficient documentation

## 2019-05-03 DIAGNOSIS — I10 Essential (primary) hypertension: Secondary | ICD-10-CM | POA: Diagnosis not present

## 2019-05-03 LAB — POCT PREGNANCY, URINE: Preg Test, Ur: NEGATIVE

## 2019-05-03 MED ORDER — MELOXICAM 15 MG PO TABS: 15.0000 mg | ORAL_TABLET | Freq: Every day | ORAL | 0 refills | Status: DC

## 2019-05-03 MED ORDER — CYCLOBENZAPRINE HCL 10 MG PO TABS: 10.0000 mg | ORAL_TABLET | Freq: Three times a day (TID) | ORAL | 0 refills | Status: DC | PRN

## 2019-05-03 NOTE — Discharge Instructions (Signed)
Please follow up with orthopedics or primary care for symptoms that are not improving over the week.  Return to the ER for symptoms that change or worsen if unable to schedule an appointment.
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
## 2019-05-26 ENCOUNTER — Ambulatory Visit: Payer: BC Managed Care – PPO | Admitting: Obstetrics and Gynecology

## 2019-05-26 ENCOUNTER — Encounter: Payer: Self-pay | Admitting: Obstetrics and Gynecology

## 2019-05-26 ENCOUNTER — Other Ambulatory Visit: Payer: Self-pay

## 2019-05-26 VITALS — BP 130/90 | HR 103 | Ht 63.0 in | Wt 173.7 lb

## 2019-05-26 DIAGNOSIS — Z6830 Body mass index (BMI) 30.0-30.9, adult: Secondary | ICD-10-CM

## 2019-05-26 DIAGNOSIS — Z7689 Persons encountering health services in other specified circumstances: Secondary | ICD-10-CM

## 2019-05-26 DIAGNOSIS — E669 Obesity, unspecified: Secondary | ICD-10-CM

## 2019-05-26 MED ORDER — CYANOCOBALAMIN 1000 MCG/ML IJ SOLN: 1000.0000 ug | INTRAMUSCULAR | 1 refills | Status: DC

## 2019-05-26 MED ORDER — PHENTERMINE HCL 37.5 MG PO TABS: 37.5000 mg | ORAL_TABLET | Freq: Every day | ORAL | 1 refills | Status: DC

## 2019-05-26 MED ORDER — CYANOCOBALAMIN 1000 MCG/ML IJ SOLN
1000.0000 ug | Freq: Once | INTRAMUSCULAR | Status: AC
  Administered 2019-05-26: 15:00:00 1000 ug via INTRAMUSCULAR

## 2019-05-26 NOTE — Addendum Note (Signed)
Addended by: Elouise Munroe on: 05/26/2019 03:20 PM   Modules accepted: Orders

## 2019-05-26 NOTE — Progress Notes (Signed)
Subjective:  Sarah Eaton is a 33 y.o. [phone] at Unknown being seen today for weight loss management- initial visit.  Patient reports General ROS: negative and reports previous weight loss attempts:  Previous treatment includes: small frequent feedings, nutritional supplement, vitamin supplement,  vitamin B-12 injections and appetite suppresant.   Past evaluation has included: metabolic profile, hemoglobin A1c, thyroid panel.   The following portions of the patient's history were reviewed and updated as appropriate: allergies, current medications, past family history, past medical history, past social history, past surgical history and problem list.   Objective:   Vitals:   05/26/19 1452 05/26/19 1456  BP: (!) 135/108 130/90  Pulse: (!) 103   Weight: 173 lb 11.2 oz (78.8 kg)   Height: 5\' 3"  (1.6 m)     General:  Alert, oriented and cooperative. Patient is in no acute distress.  :   :   :   :   :   :   PE: Well groomed female in no current distress,   Mental Status: Normal mood and affect. Normal behavior. Normal judgment and thought content.   Current BMI: Body mass index is 30.77 kg/m.   Assessment and Plan:  Obesity  There are no diagnoses linked to this encounter.  Plan: low carb, High protein diet RX for adipex 37.5 mg daily and B12 1087mcg.ml monthly, to start now with first injection given at today's visit. Reviewed side-effects common to both medications and expected outcomes. Increase daily water intake to at least 8 bottle a day, every day.  Goal is to reduse weight by 10% by end of three months, and will re-evaluate then.  RTC in 4 weeks for Nurse visit to check weight & BP, and get next B12 injections.    Please refer to After Visit Summary for other counseling recommendations.    Corry, Sarah Eaton, CNM   Sarah Eaton, CNM      Consider the Low Glycemic Index Diet and 6 smaller meals daily .  This boosts your metabolism and regulates  your sugars:   Use the protein bar by [employer] because they have lots of fiber in them  Find the low carb flatbreads, tortillas and pita breads for sandwiches:  Joseph's makes a pita bread and a flat bread , available at [facility] and [employer]; [place] makes a low carb flatbread available at [employer] and [employer] that is 9 net carbs and 100 cal [place] makes a low carb whole wheat tortilla available at [employer] most grocery stores with 6 net carbs and 210 cal  [place] yogurt can still have a lot of carbs .  Dannon Light Eaton fit has 80 cal and 8 carbs

## 2019-07-01 ENCOUNTER — Encounter: Payer: Self-pay | Admitting: Obstetrics and Gynecology

## 2019-07-01 ENCOUNTER — Ambulatory Visit (INDEPENDENT_AMBULATORY_CARE_PROVIDER_SITE_OTHER): Payer: BC Managed Care – PPO | Admitting: Obstetrics and Gynecology

## 2019-07-01 ENCOUNTER — Other Ambulatory Visit: Payer: Self-pay

## 2019-07-01 VITALS — BP 129/89 | HR 98 | Ht 62.0 in | Wt 177.5 lb

## 2019-07-01 DIAGNOSIS — Z01419 Encounter for gynecological examination (general) (routine) without abnormal findings: Secondary | ICD-10-CM

## 2019-07-01 MED ORDER — RIZATRIPTAN BENZOATE 10 MG PO TABS: 10.0000 mg | ORAL_TABLET | ORAL | 6 refills | Status: DC | PRN

## 2019-07-01 MED ORDER — TOPIRAMATE 100 MG PO TABS: 100.0000 mg | ORAL_TABLET | Freq: Two times a day (BID) | ORAL | 5 refills | Status: DC

## 2019-07-01 MED ORDER — PHENTERMINE HCL 37.5 MG PO TABS: 37.5000 mg | ORAL_TABLET | Freq: Every day | ORAL | 1 refills | Status: DC

## 2019-07-01 NOTE — Patient Instructions (Signed)
 Preventive Care 21-31 Years Old, Female Preventive care refers to visits with your health care provider and lifestyle choices that can promote health and wellness. This includes:  A yearly physical exam. This may also be called an annual well check.  Regular dental visits and eye exams.  Immunizations.  Screening for certain conditions.  Healthy lifestyle choices, such as eating a healthy diet, getting regular exercise, not using drugs or products that contain nicotine and tobacco, and limiting alcohol use. What can I expect for my preventive care visit? Physical exam Your health care provider will check your:  Height and weight. This may be used to calculate body mass index (BMI), which tells if you are at a healthy weight.  Heart rate and blood pressure.  Skin for abnormal spots. Counseling Your health care provider may ask you questions about your:  Alcohol, tobacco, and drug use.  Emotional well-being.  Home and relationship well-being.  Sexual activity.  Eating habits.  Work and work environment.  Method of birth control.  Menstrual cycle.  Pregnancy history. What immunizations do I need?  Influenza (flu) vaccine  This is recommended every year. Tetanus, diphtheria, and pertussis (Tdap) vaccine  You may need a Td booster every 10 years. Varicella (chickenpox) vaccine  You may need this if you have not been vaccinated. Human papillomavirus (HPV) vaccine  If recommended by your health care provider, you may need three doses over 6 months. Measles, mumps, and rubella (MMR) vaccine  You may need at least one dose of MMR. You may also need a second dose. Meningococcal conjugate (MenACWY) vaccine  One dose is recommended if you are age 19-21 years and a first-year college student living in a residence hall, or if you have one of several medical conditions. You may also need additional booster doses. Pneumococcal conjugate (PCV13) vaccine  You may need  this if you have certain conditions and were not previously vaccinated. Pneumococcal polysaccharide (PPSV23) vaccine  You may need one or two doses if you smoke cigarettes or if you have certain conditions. Hepatitis A vaccine  You may need this if you have certain conditions or if you travel or work in places where you may be exposed to hepatitis A. Hepatitis B vaccine  You may need this if you have certain conditions or if you travel or work in places where you may be exposed to hepatitis B. Haemophilus influenzae type b (Hib) vaccine  You may need this if you have certain conditions. You may receive vaccines as individual doses or as more than one vaccine together in one shot (combination vaccines). Talk with your health care provider about the risks and benefits of combination vaccines. What tests do I need?  Blood tests  Lipid and cholesterol levels. These may be checked every 5 years starting at age 20.  Hepatitis C test.  Hepatitis B test. Screening  Diabetes screening. This is done by checking your blood sugar (glucose) after you have not eaten for a while (fasting).  Sexually transmitted disease (STD) testing.  BRCA-related cancer screening. This may be done if you have a family history of breast, ovarian, tubal, or peritoneal cancers.  Pelvic exam and Pap test. This may be done every 3 years starting at age 21. Starting at age 30, this may be done every 5 years if you have a Pap test in combination with an HPV test. Talk with your health care provider about your test results, treatment options, and if necessary, the need for more   tests. Follow these instructions at home: Eating and drinking   Eat a diet that includes fresh fruits and vegetables, whole grains, lean protein, and low-fat dairy.  Take vitamin and mineral supplements as recommended by your health care provider.  Do not drink alcohol if: ? Your health care provider tells you not to drink. ? You are  pregnant, may be pregnant, or are planning to become pregnant.  If you drink alcohol: ? Limit how much you have to 0-1 drink a day. ? Be aware of how much alcohol is in your drink. In the [place] one drink equals one 12 oz bottle of beer (355 mL), one 5 oz glass of wine (148 mL), or one 1 oz glass of hard liquor (44 mL). Lifestyle  Take daily care of your teeth and gums.  Stay active. Exercise for at least 30 minutes on 5 or more days each week.  Do not use any products that contain nicotine or tobacco, such as cigarettes, e-cigarettes, and chewing tobacco. If you need help quitting, ask your health care provider.  If you are sexually active, practice safe sex. Use a condom or other form of birth control (contraception) in order to prevent pregnancy and STIs (sexually transmitted infections). If you plan to become pregnant, see your health care provider for a preconception visit. What's next?  Visit your health care provider once a year for a well check visit.  Ask your health care provider how often you should have your eyes and teeth checked.  Stay up to date on all vaccines. This information is not intended to replace advice given to you by your health care provider. Make sure you discuss any questions you have with your health care provider. Document Released: 12/31/2001 Document Revised: 07/16/2018 Document Reviewed: 07/16/2018 Elsevier Patient Education  2020 [employer].

## 2019-07-01 NOTE — Progress Notes (Signed)
SUBJECTIVE:  33 y.o. caucasian female presents for annual routine checkup with no concerns.   Pt currently on weight loss program with our office. Pt work FT job in [employer]. Sexually active with single female partner, recently engaged. Tubal. Regular menses with no concerns. No concerns for STIs at this time. Reports regular diet with exercise. Intermittent alcohol use 1-2 drinks per mont, denies tobacco or illicit drug use.    Current Outpatient Medications  Medication Sig Dispense Refill  . acyclovir (ZOVIRAX) 400 MG tablet Take 5 tablets daily. 50 tablet 1  . cyanocobalamin (,VITAMIN B-12,) 1000 MCG/ML injection Inject 1 mL (1,000 mcg total) into the muscle every 30 (thirty) days. 10 mL 1  . phentermine (ADIPEX-P) 37.5 MG tablet Take 1 tablet (37.5 mg total) by mouth daily before breakfast. 30 tablet 1  . rizatriptan (MAXALT) 10 MG tablet TAKE 1 TABLET (10 MG TOTAL) BY MOUTH AS NEEDED FOR MIGRAINE. MAY REPEAT IN 2 HOURS IF NEEDED 10 tablet 6  . topiramate (TOPAMAX) 100 MG tablet TAKE 1 TABLET BY MOUTH TWICE A DAY 60 tablet 5  . cyclobenzaprine (FLEXERIL) 10 MG tablet Take 1 tablet (10 mg total) by mouth 3 (three) times daily as needed. (Patient not taking: Reported on 07/01/2019) 30 tablet 0  . meloxicam (MOBIC) 15 MG tablet Take 1 tablet (15 mg total) by mouth daily. (Patient not taking: Reported on 07/01/2019) 30 tablet 0   No current facility-administered medications for this visit.    Allergies: Banana and Tuna [fish allergy]  No LMP recorded. Patient has had an ablation.  ROS:  Feeling well. No dyspnea or chest pain on exertion.  No abdominal pain, change in bowel habits, black or bloody stools.  No urinary tract symptoms. GYN ROS: normal menses, no abnormal bleeding, pelvic pain or discharge, no breast pain or new or enlarging lumps on self exam. No neurological complaints.  OBJECTIVE:  The patient appears well, alert, oriented x 3, in no distress. BP 129/89   Pulse 98   Ht 5\' 2"   (1.575 m)   Wt 80.5 kg   BMI 32.47 kg/m  ENT normal.  Neck supple. No adenopathy or thyromegaly. PERLA. Lungs are clear, good air entry, no wheezes, rhonchi or rales. S1 and S2 normal, no murmurs, regular rate and rhythm. Abdomen soft without tenderness, guarding, mass or organomegaly. Extremities show no edema, normal peripheral pulses. Neurological is normal, no focal findings.  BREAST EXAM: breasts appear normal, no suspicious masses, no skin or nipple changes or axillary nodes  PELVIC EXAM: normal external genitalia, vulva, vagina, cervix, uterus and adnexa  ASSESSMENT:  well woman  PLAN:  return in 1 yr for AE or sooner if needed Labs ordered Refilled Adipex, Maxalt, Topamax Rx Silvestre Mesi, SNM

## 2019-07-02 LAB — COMPREHENSIVE METABOLIC PANEL
ALT: 21 IU/L (ref 0–32)
AST: 21 IU/L (ref 0–40)
Albumin/Globulin Ratio: 2 (ref 1.2–2.2)
Albumin: 4.4 g/dL (ref 3.8–4.8)
Alkaline Phosphatase: 55 IU/L (ref 39–117)
BUN/Creatinine Ratio: 18 (ref 9–23)
BUN: 13 mg/dL (ref 6–20)
Bilirubin Total: <0.2 mg/dL (ref 0.0–1.2)
CO2: 23 mmol/L (ref 20–29)
Calcium: 9.4 mg/dL (ref 8.7–10.2)
Chloride: 100 mmol/L (ref 96–106)
Creatinine, Ser: 0.71 mg/dL (ref 0.57–1.00)
GFR calc Af Amer: 131 mL/min/{1.73_m2} (ref >59)
GFR calc non Af Amer: 114 mL/min/{1.73_m2} (ref >59)
Globulin, Total: 2.2 g/dL (ref 1.5–4.5)
Glucose: 89 mg/dL (ref 65–99)
Potassium: 4.4 mmol/L (ref 3.5–5.2)
Sodium: 136 mmol/L (ref 134–144)
Total Protein: 6.6 g/dL (ref 6.0–8.5)

## 2019-07-02 LAB — CBC
Hematocrit: 40 % (ref 34.0–46.6)
Hemoglobin: 13.3 g/dL (ref 11.1–15.9)
MCH: 30.4 pg (ref 26.6–33.0)
MCHC: 33.3 g/dL (ref 31.5–35.7)
MCV: 91 fL (ref 79–97)
Platelets: 311 10*3/uL (ref 150–450)
RBC: 4.38 x10E6/uL (ref 3.77–5.28)
RDW: 12.5 % (ref 11.7–15.4)
WBC: 8.3 10*3/uL (ref 3.4–10.8)

## 2019-07-02 LAB — LIPID PANEL
Chol/HDL Ratio: 2.6 ratio (ref 0.0–4.4)
Cholesterol, Total: 176 mg/dL (ref 100–199)
HDL: 68 mg/dL (ref >39)
LDL Calculated: 90 mg/dL (ref 0–99)
Triglycerides: 89 mg/dL (ref 0–149)
VLDL Cholesterol Cal: 18 mg/dL (ref 5–40)

## 2019-07-02 LAB — TSH: TSH: 2.01 u[IU]/mL (ref 0.450–4.500)

## 2019-07-02 LAB — HEMOGLOBIN A1C
Est. average glucose Bld gHb Est-mCnc: 94 mg/dL
Hgb A1c MFr Bld: 4.9 % (ref 4.8–5.6)

## 2019-08-24 ENCOUNTER — Encounter: Payer: Self-pay | Admitting: Obstetrics and Gynecology

## 2019-08-24 ENCOUNTER — Other Ambulatory Visit: Payer: Self-pay

## 2019-08-24 ENCOUNTER — Ambulatory Visit (INDEPENDENT_AMBULATORY_CARE_PROVIDER_SITE_OTHER): Payer: BC Managed Care – PPO | Admitting: Obstetrics and Gynecology

## 2019-08-24 VITALS — BP 124/84 | HR 83 | Ht 62.0 in | Wt 177.6 lb

## 2019-08-24 DIAGNOSIS — Z01419 Encounter for gynecological examination (general) (routine) without abnormal findings: Secondary | ICD-10-CM | POA: Diagnosis not present

## 2019-08-24 MED ORDER — CYANOCOBALAMIN 1000 MCG/ML IJ SOLN
1000.0000 ug | Freq: Once | INTRAMUSCULAR | Status: AC
  Administered 2019-08-24: 1000 ug via INTRAMUSCULAR

## 2019-08-24 NOTE — Progress Notes (Signed)
Pt is here for wt, bp check, b-12 inj She is doing well, denies any s/e   08/24/19 wt- 177.6lb 06/21/19 wt- 177lb   Waist 30in

## 2019-09-22 ENCOUNTER — Ambulatory Visit (INDEPENDENT_AMBULATORY_CARE_PROVIDER_SITE_OTHER): Payer: BC Managed Care – PPO | Admitting: Obstetrics and Gynecology

## 2019-09-22 ENCOUNTER — Other Ambulatory Visit: Payer: Self-pay

## 2019-09-22 ENCOUNTER — Encounter: Payer: Self-pay | Admitting: Obstetrics and Gynecology

## 2019-09-22 VITALS — BP 120/92 | HR 90 | Ht 62.0 in | Wt 180.1 lb

## 2019-09-22 DIAGNOSIS — Z713 Dietary counseling and surveillance: Secondary | ICD-10-CM

## 2019-09-22 DIAGNOSIS — Z6830 Body mass index (BMI) 30.0-30.9, adult: Secondary | ICD-10-CM

## 2019-09-22 DIAGNOSIS — Z7689 Persons encountering health services in other specified circumstances: Secondary | ICD-10-CM

## 2019-09-22 MED ORDER — CYANOCOBALAMIN 1000 MCG/ML IJ SOLN
1000.0000 ug | Freq: Once | INTRAMUSCULAR | Status: AC
  Administered 2019-09-22: 15:00:00 1000 ug via INTRAMUSCULAR

## 2019-09-22 MED ORDER — PHENTERMINE HCL 37.5 MG PO TABS: 37.5000 mg | ORAL_TABLET | Freq: Every day | ORAL | 1 refills | Status: DC

## 2019-09-22 NOTE — Progress Notes (Signed)
Patient was unable to wait for extended appointment as she had to return to work. Desires refill of adipex ( sent in today). Signed patient agreement form and it is placed in chart.  Vitals with BMI 09/22/2019 08/24/2019 07/01/2019  Height 5\' 2"  5\' 2"  5\' 2"   Weight 180 lbs 2 oz 177 lbs 10 oz 177 lbs 8 oz  BMI 32.93 82.95 62.13  Systolic 086 578 469  Diastolic 92 84 89  Pulse 90 83 98   B12 given RTC 4 weeks or sooner if needed.   Jaydon Avina,CNM

## 2019-09-22 NOTE — Progress Notes (Signed)
Sarah Eaton presents for weight, B/P and B-12 injection. No side effects of medications-Phentermine or B-12. Weight gain 3 lbs. Encouraged eating healthy and exercise.  Patient has been out of Phentermine since 10/13.    Waist 29.5

## 2019-10-06 ENCOUNTER — Other Ambulatory Visit: Payer: Self-pay

## 2019-10-06 DIAGNOSIS — Z20822 Contact with and (suspected) exposure to covid-19: Secondary | ICD-10-CM

## 2019-10-08 LAB — NOVEL CORONAVIRUS, NAA: SARS-CoV-2, NAA: DETECTED — AB

## 2019-10-20 ENCOUNTER — Other Ambulatory Visit: Payer: Self-pay

## 2019-10-20 ENCOUNTER — Ambulatory Visit (INDEPENDENT_AMBULATORY_CARE_PROVIDER_SITE_OTHER): Payer: BC Managed Care – PPO | Admitting: Certified Nurse Midwife

## 2019-10-20 ENCOUNTER — Encounter: Payer: Self-pay | Admitting: Certified Nurse Midwife

## 2019-10-20 VITALS — BP 126/85 | HR 94 | Ht 62.0 in | Wt 184.5 lb

## 2019-10-20 DIAGNOSIS — E669 Obesity, unspecified: Secondary | ICD-10-CM | POA: Insufficient documentation

## 2019-10-20 DIAGNOSIS — Z7689 Persons encountering health services in other specified circumstances: Secondary | ICD-10-CM

## 2019-10-20 DIAGNOSIS — E663 Overweight: Secondary | ICD-10-CM | POA: Insufficient documentation

## 2019-10-20 NOTE — Progress Notes (Signed)
SUBJECTIVE:  33 y.o. here for follow-up weight loss visit, previously seen 4 weeks ago. Denies any concerns and feels like medication is working. She started program 05/2019 with Melody at 177 lbs. Today she is 184lbs. Discussed risk, intended time of use, and tolerance of medication. Written information given. Give that the pt has gained weight, informed pt that I do not feel like the benefits out weight the risk for use. Discussed that she should take a break from use , continue to diet and exercise and in 3-6 months we could revisit starting again.  OBJECTIVE:  BP 126/85   Pulse 94   Ht 5\' 2"  (1.575 m)   Wt 184 lb 8 oz (83.7 kg)   LMP  (LMP Unknown)   BMI 33.75 kg/m   Body mass index is 33.75 kg/m. Patient appears well. ASSESSMENT:  Obesity- not responding well to weight loss plan PLAN:  To discontinue with current medications. B12 1026mcg/ml injection given Follow up in 3-6 months to revaluate. Offered nutrition consult and referral to weight loss specialist. She declines.   Philip Aspen, CNM

## 2019-10-20 NOTE — Patient Instructions (Signed)

## 2019-11-22 ENCOUNTER — Ambulatory Visit (INDEPENDENT_AMBULATORY_CARE_PROVIDER_SITE_OTHER): Payer: BC Managed Care – PPO | Admitting: Obstetrics and Gynecology

## 2019-11-22 ENCOUNTER — Other Ambulatory Visit: Payer: Self-pay

## 2019-11-22 VITALS — BP 135/87 | HR 90 | Ht 62.0 in | Wt 187.8 lb

## 2019-11-22 DIAGNOSIS — Z7689 Persons encountering health services in other specified circumstances: Secondary | ICD-10-CM

## 2019-11-22 NOTE — Progress Notes (Signed)
Pt present for weight and BP check. Pt stated that is out of phentermine and needs a refill. Pt stated that she would like to continue taking the phentermine. Pt would to see [facility] for annual exam and weight management.

## 2019-12-21 ENCOUNTER — Encounter: Payer: Self-pay | Admitting: Obstetrics and Gynecology

## 2019-12-21 ENCOUNTER — Other Ambulatory Visit: Payer: Self-pay

## 2019-12-21 ENCOUNTER — Ambulatory Visit: Payer: BC Managed Care – PPO | Admitting: Obstetrics and Gynecology

## 2019-12-21 VITALS — BP 132/87 | HR 105 | Ht 62.0 in | Wt 186.0 lb

## 2019-12-21 DIAGNOSIS — Z7689 Persons encountering health services in other specified circumstances: Secondary | ICD-10-CM

## 2019-12-21 DIAGNOSIS — E669 Obesity, unspecified: Secondary | ICD-10-CM

## 2019-12-21 MED ORDER — PHENTERMINE HCL 37.5 MG PO TABS: 37.5000 mg | ORAL_TABLET | Freq: Every day | ORAL | 0 refills | Status: DC

## 2019-12-21 NOTE — Progress Notes (Signed)
Pt present for 4 week weight management check. Pt last weight 11/22/19 weight 187lb. Pt stated that she was doing well no problems.

## 2019-12-21 NOTE — Patient Instructions (Signed)
Preventing Unhealthy Weight Gain, Adult Staying at a healthy weight is important to your overall health. When fat builds up in your body, you may become overweight or obese. Being overweight or obese increases your risk of developing certain health problems, such as heart disease, diabetes, sleeping problems, joint problems, and some types of cancer. Unhealthy weight gain is often the result of making unhealthy food choices or not getting enough exercise. You can make changes to your lifestyle to prevent obesity and stay as healthy as possible. What nutrition changes can be made?   Eat only as much as your body needs. To do this: ? Pay attention to signs that you are hungry or full. Stop eating as soon as you feel full. ? If you feel hungry, try drinking water first before eating. Drink enough water so your urine is clear or pale yellow. ? Eat smaller portions. Pay attention to portion sizes when eating out. ? Look at serving sizes on food labels. Most foods contain more than one serving per container. ? Eat the recommended number of calories for your gender and activity level. For most active people, a daily total of 2,000 calories is appropriate. If you are trying to lose weight or are not very active, you may need to eat fewer calories. Talk with your health care provider or a diet and nutrition specialist (dietitian) about how many calories you need each day.  Choose healthy foods, such as: ? Fruits and vegetables. At each meal, try to fill at least half of your plate with fruits and vegetables. ? Whole grains, such as whole-wheat bread, brown rice, and quinoa. ? Lean meats, such as chicken or fish. ? Other healthy proteins, such as beans, eggs, or tofu. ? Healthy fats, such as nuts, seeds, fatty fish, and olive oil. ? Low-fat or fat-free dairy products.  Check food labels, and avoid food and drinks that: ? Are high in calories. ? Have added sugar. ? Are high in sodium. ? Have saturated  fats or trans fats.  Cook foods in healthier ways, such as by baking, broiling, or grilling.  Make a meal plan for the week, and shop with a grocery list to help you stay on track with your purchases. Try to avoid going to the grocery store when you are hungry.  When grocery shopping, try to shop around the outside of the store first, where the fresh foods are. Doing this helps you to avoid prepackaged foods, which can be high in sugar, salt (sodium), and fat. What lifestyle changes can be made?   Exercise for 30 or more minutes on 5 or more days each week. Exercising may include brisk walking, yard work, biking, running, swimming, and team sports like basketball and soccer. Ask your health care provider which exercises are safe for you.  Do muscle-strengthening activities, such as lifting weights or using resistance bands, on 2 or more days a week.  Do not use any products that contain nicotine or tobacco, such as cigarettes and e-cigarettes. If you need help quitting, ask your health care provider.  Limit alcohol intake to no more than 1 drink a day for nonpregnant women and 2 drinks a day for men. One drink equals 12 oz of beer, 5 oz of wine, or 1 oz of hard liquor.  Try to get 7-9 hours of sleep each night. What other changes can be made?  Keep a food and activity journal to keep track of: ? What you ate and how many calories   you had. Remember to count the calories in sauces, dressings, and side dishes. ? Whether you were active, and what exercises you did. ? Your calorie, weight, and activity goals.  Check your weight regularly. Track any changes. If you notice you have gained weight, make changes to your diet or activity routine.  Avoid taking weight-loss medicines or supplements. Talk to your health care provider before starting any new medicine or supplement.  Talk to your health care provider before trying any new diet or exercise plan. Why are these changes  important? Eating healthy, staying active, and having healthy habits can help you to prevent obesity. Those changes also:  Help you manage stress and emotions.  Help you connect with friends and family.  Improve your self-esteem.  Improve your sleep.  Prevent long-term health problems. What can happen if changes are not made? Being obese or overweight can cause you to develop joint or bone problems, which can make it hard for you to stay active or do activities you enjoy. Being obese or overweight also puts stress on your heart and lungs and can lead to health problems like diabetes, heart disease, and some cancers. Where to find more information Talk with your health care provider or a dietitian about healthy eating and healthy lifestyle choices. You may also find information from:  [place] Department of Agriculture, MyPlate: www.choosemyplate.gov  American Heart Association: www.heart.org  Centers for Disease Control and Prevention: www.cdc.gov Summary  Staying at a healthy weight is important to your overall health. It helps you to prevent certain diseases and health problems, such as heart disease, diabetes, joint problems, sleep disorders, and some types of cancer.  Being obese or overweight can cause you to develop joint or bone problems, which can make it hard for you to stay active or do activities you enjoy.  You can prevent unhealthy weight gain by eating a healthy diet, exercising regularly, not smoking, limiting alcohol, and getting enough sleep.  Talk with your health care provider or a dietitian for guidance about healthy eating and healthy lifestyle choices. This information is not intended to replace advice given to you by your health care provider. Make sure you discuss any questions you have with your health care provider. Document Revised: 11/07/2017 Document Reviewed: 12/11/2016 Elsevier Patient Education  2020 [employer] Inc[employer]  

## 2019-12-21 NOTE — Progress Notes (Signed)
    GYNECOLOGY PROGRESS NOTE  Subjective:    Patient ID: Sarah Eaton, female    DOB: [birth year], 34 y.o.   MRN: [id number]  HPI  Patient is a 34 y.o. [id number] female who presents for weight management. She has been on Phentermine since November. She denies any major issues or side effects from the medications.    Interventions to date: Intermittent fasting, and also partial keto diet. Has been self-injecting Vitamin B12 monthly. Physical activity: activities with kids outside when weather permits. Does not feel comfortable going to the gym during COVID. Water intake is only 2 bottles per day.    Vitals with BMI 12/21/2019 11/22/2019 10/20/2019  Height 5\' 2"  5\' 2"  5\' 2"   Weight 186 lbs 187 lbs 13 oz 184 lbs 8 oz  BMI 34.01 34.34 33.74  Systolic 132 135  Diastolic 87 87 85  Pulse 105 90 94     The following portions of the patient's history were reviewed and updated as appropriate: allergies, current medications, past family history, past medical history, past social history, past surgical history and problem list.   Review of Systems Pertinent items noted in HPI and remainder of comprehensive ROS otherwise negative.   Objective:   Blood pressure 132/87, pulse (!) 105, height 5\' 2"  (1.575 m), weight 186 lb (84.4 kg). Waist  circumference 33 inches (previously 35 or 36 per patient report).  General appearance: alert and no distress Abdomen: soft, non-tender; bowel sounds normal; no masses,  no organomegaly   Assessment:   Encounter for weight management Obesity (BMI 30.0-34.9)  Plan:   - Patient with mild obesity, currently on Phentermine for weight loss. Has only lost 1 lb since last month. Advised on increasing water intake, physical activity. Patient currently also on Topamax for migraines (which is also known for appetite suppression). Will increase Phentermine dosing to alternating 2 tabs every other day.  - Will check Vitamin B12 levels to assess if injections are still  warranted.  - Will f/u in 1 month. If still no significant weight change, will need to take a break from Phentermine.  - RTC in 1 month for weight loss assessment.    , MD [facility]

## 2020-01-18 ENCOUNTER — Other Ambulatory Visit: Payer: Self-pay

## 2020-01-18 ENCOUNTER — Ambulatory Visit (INDEPENDENT_AMBULATORY_CARE_PROVIDER_SITE_OTHER): Payer: BC Managed Care – PPO | Admitting: Obstetrics and Gynecology

## 2020-01-18 VITALS — BP 114/81 | HR 98 | Ht 62.0 in | Wt 177.1 lb

## 2020-01-18 DIAGNOSIS — Z0189 Encounter for other specified special examinations: Secondary | ICD-10-CM

## 2020-01-18 DIAGNOSIS — Z0130 Encounter for examination of blood pressure without abnormal findings: Secondary | ICD-10-CM

## 2020-01-18 DIAGNOSIS — D519 Vitamin B12 deficiency anemia, unspecified: Secondary | ICD-10-CM | POA: Diagnosis not present

## 2020-01-18 DIAGNOSIS — Z7689 Persons encountering health services in other specified circumstances: Secondary | ICD-10-CM

## 2020-01-18 DIAGNOSIS — E669 Obesity, unspecified: Secondary | ICD-10-CM

## 2020-01-18 MED ORDER — PHENTERMINE HCL 37.5 MG PO TABS: 37.5000 mg | ORAL_TABLET | Freq: Every day | ORAL | 0 refills | Status: DC

## 2020-01-18 MED ORDER — CYANOCOBALAMIN 1000 MCG/ML IJ SOLN
1000.0000 ug | Freq: Once | INTRAMUSCULAR | Status: AC
  Administered 2020-01-18: 1000 ug via INTRAMUSCULAR

## 2020-01-18 NOTE — Progress Notes (Signed)
Sarah Eaton presents for weight, B/P and B-12 injection. No side effects of medications-Phentermine or B-12. Weight loss 9 lbs. Encouraged eating healthy and exercise.   BP 114/81   Pulse 98   Ht 5\' 2"  (1.575 m)   Wt 177 lb 1.6 oz (80.3 kg)   BMI 32.39 kg/m   Patient requesting refill be sent in for Phentermine.

## 2020-01-19 LAB — VITAMIN B12: Vitamin B-12: 743 pg/mL (ref 232–1245)

## 2020-01-25 ENCOUNTER — Encounter: Payer: Self-pay | Admitting: Obstetrics and Gynecology

## 2020-01-25 ENCOUNTER — Ambulatory Visit: Payer: BC Managed Care – PPO | Admitting: Obstetrics and Gynecology

## 2020-01-25 ENCOUNTER — Other Ambulatory Visit: Payer: Self-pay

## 2020-01-25 VITALS — BP 127/90 | HR 106 | Ht 67.0 in | Wt 173.1 lb

## 2020-01-25 DIAGNOSIS — R399 Unspecified symptoms and signs involving the genitourinary system: Secondary | ICD-10-CM | POA: Diagnosis not present

## 2020-01-25 LAB — POCT URINALYSIS DIPSTICK
Bilirubin, UA: NEGATIVE
Blood, UA: NEGATIVE
Glucose, UA: NEGATIVE
Ketones, UA: NEGATIVE
Nitrite, UA: NEGATIVE
Protein, UA: POSITIVE — AB
Spec Grav, UA: 1.02 (ref 1.010–1.025)
Urobilinogen, UA: 0.2 E.U./dL
pH, UA: 6.5 (ref 5.0–8.0)

## 2020-01-25 NOTE — Progress Notes (Signed)
Pt present today for UTI symptoms of back pain and pressure with urination. UA completed and documented.    Results for orders placed or performed in visit on 01/25/20  POCT urinalysis dipstick  Result Value Ref Range   Color, UA yellow    Clarity, UA clear    Glucose, UA Negative Negative   Bilirubin, UA neg    Ketones, UA neg    Spec Grav, UA 1.020 1.010 - 1.025   Blood, UA neg    pH, UA 6.5 5.0 - 8.0   Protein, UA Positive (A) Negative   Urobilinogen, UA 0.2 0.2 or 1.0 E.U./dL   Nitrite, UA neg    Leukocytes, UA Trace (A) Negative   Appearance yellow;clear    Odor

## 2020-02-22 ENCOUNTER — Other Ambulatory Visit: Payer: Self-pay

## 2020-02-22 ENCOUNTER — Encounter: Payer: Self-pay | Admitting: Obstetrics and Gynecology

## 2020-02-22 ENCOUNTER — Ambulatory Visit (INDEPENDENT_AMBULATORY_CARE_PROVIDER_SITE_OTHER): Payer: BC Managed Care – PPO | Admitting: Obstetrics and Gynecology

## 2020-02-22 VITALS — BP 125/88 | HR 105 | Ht 67.0 in | Wt 168.8 lb

## 2020-02-22 DIAGNOSIS — Z7689 Persons encountering health services in other specified circumstances: Secondary | ICD-10-CM

## 2020-02-22 DIAGNOSIS — E663 Overweight: Secondary | ICD-10-CM | POA: Diagnosis not present

## 2020-02-22 MED ORDER — PHENTERMINE HCL 37.5 MG PO TABS: 37.5000 mg | ORAL_TABLET | Freq: Every day | ORAL | 0 refills | Status: DC

## 2020-02-22 NOTE — Progress Notes (Signed)
    GYNECOLOGY PROGRESS NOTE  Subjective:    Patient ID: Sarah Eaton, female    DOB: 01-Dec-1985, 34 y.o.   MRN: [id number]  HPI  Patient is a 34 y.o. female who presents for 4 month weight management follow up. She initiated use of Phentermine almost 4 months ago. Also added Topiramate 2 months ago. Denies any undesirable side effects and reports compliance with medications. Starting weight was 187 lbs in January. Has lost 19 lbs so far. Goal weight is 130-140 lbs.    Current interventions:  1. Diet - continues Keto diet. Has stopped intermittent fasting. Eating out much less, cooking at home more. Has increased water intake.  2. Activity - Physical activity is outdoor activity with kids.  3. Reports bowel movements are regular.    The following portions of the patient's history were reviewed and updated as appropriate: allergies, current medications, past family history, past medical history, past social history, past surgical history and problem list.  Review of Systems Pertinent items noted in HPI and remainder of comprehensive ROS otherwise negative.   Objective:    Vitals with BMI 02/22/2020 01/25/2020 01/18/2020  Height 5\' 7"  5\' 7"  5\' 2"   Weight 168 lbs 13 oz 173 lbs 2 oz 177 lbs 2 oz  BMI 26.43 27.11 32.38  Systolic 125 127  Diastolic 88 90 81  Pulse 105 106 98    General appearance: alert and no distress Abdomen: soft, non-tender.  Waist circumference 30.5 in.    Labs:  No new labs.  Assessment:   Weight management. Overweight, Body mass index is 26.44 kg/m.  Plan:   1. Patient has decreased from obesity BMI to now overweight.  Currently on Phentermine and Topiramate (also using for migraine prophylaxis) for weight loss.  Will continue  Phentermine dosing to alternating 2 tabs every other day for 1 more month until patient reaches normal BMI. Continue to encourage diet, physical activity, and increasing water intake.  - Will f/u in 1 month for final weight  check.    , MD [facility]

## 2020-02-22 NOTE — Progress Notes (Signed)
Pt present for weight management. Pt last visit was 01/18/2020 wt 177lb. Pt stated that she taking the medication phenetamine as prescribed.  Requested refill of phenetamine.

## 2020-03-03 ENCOUNTER — Other Ambulatory Visit: Payer: Self-pay | Admitting: Obstetrics and Gynecology

## 2020-03-03 NOTE — Telephone Encounter (Signed)
Please advise. Thanks Tiphani Mells 

## 2020-03-05 NOTE — Telephone Encounter (Signed)
Please ask the patient what diagnosis this medication is used for as we need to associate the medication with one (nothing noted in her chart) and how she takes the medication as the last prescription does not tell how she takes it.  We need this info to refill for the pharmacy.

## 2020-03-06 ENCOUNTER — Other Ambulatory Visit: Payer: Self-pay

## 2020-03-08 ENCOUNTER — Other Ambulatory Visit: Payer: Self-pay | Admitting: Obstetrics and Gynecology

## 2020-03-08 MED ORDER — VALACYCLOVIR HCL 1 G PO TABS: 1000.0000 mg | ORAL_TABLET | Freq: Two times a day (BID) | ORAL | 3 refills | Status: DC

## 2020-03-29 ENCOUNTER — Other Ambulatory Visit: Payer: Self-pay

## 2020-03-29 MED ORDER — RIZATRIPTAN BENZOATE 10 MG PO TABS: 10.0000 mg | ORAL_TABLET | ORAL | 6 refills | Status: DC | PRN

## 2020-04-05 ENCOUNTER — Other Ambulatory Visit: Payer: Self-pay

## 2020-04-05 ENCOUNTER — Ambulatory Visit (INDEPENDENT_AMBULATORY_CARE_PROVIDER_SITE_OTHER): Payer: BC Managed Care – PPO | Admitting: Obstetrics and Gynecology

## 2020-04-05 VITALS — BP 134/96 | HR 102 | Ht 67.0 in | Wt 162.5 lb

## 2020-04-05 DIAGNOSIS — E663 Overweight: Secondary | ICD-10-CM | POA: Diagnosis not present

## 2020-04-05 DIAGNOSIS — Z7689 Persons encountering health services in other specified circumstances: Secondary | ICD-10-CM

## 2020-04-05 MED ORDER — PHENTERMINE HCL 37.5 MG PO TABS: 37.5000 mg | ORAL_TABLET | Freq: Every day | ORAL | 0 refills | Status: DC

## 2020-04-05 NOTE — Progress Notes (Signed)
Pt presents for  Weight,B/P, B-12 injection. No side effects of medications-Phentermine or B-12. Weight loss 5.2  Lbs. Waist circumference 30". Encourage eating healthy and exercise.  Patient states she has a meeting with her "big boss" that she was nervous about. States she knows her BP is high but it is because she is on edge. Offered to retake it she declined. Also states she does not take B12 injections but she does need a refill on Phentermine.

## 2020-05-05 ENCOUNTER — Other Ambulatory Visit: Payer: Self-pay

## 2020-05-05 ENCOUNTER — Ambulatory Visit: Payer: BC Managed Care – PPO | Admitting: Obstetrics and Gynecology

## 2020-05-05 ENCOUNTER — Encounter: Payer: Self-pay | Admitting: Obstetrics and Gynecology

## 2020-05-05 VITALS — BP 130/93 | HR 106 | Ht 67.0 in | Wt 157.2 lb

## 2020-05-05 DIAGNOSIS — Z6824 Body mass index (BMI) 24.0-24.9, adult: Secondary | ICD-10-CM

## 2020-05-05 DIAGNOSIS — K5904 Chronic idiopathic constipation: Secondary | ICD-10-CM

## 2020-05-05 DIAGNOSIS — Z7689 Persons encountering health services in other specified circumstances: Secondary | ICD-10-CM

## 2020-05-05 NOTE — Progress Notes (Signed)
    GYNECOLOGY PROGRESS NOTE  Subjective:    Patient ID: Sarah Eaton, female    DOB: 10-18-1986, 34 y.o.   MRN: [id number]  HPI  Patient is a 34 y.o. female who presents for 1 month weight management follow up. She initiated use of Phentermine almost 5 months ago. Also added Topiramate 3 months ago. Denies any undesirable side effects and reports compliance with medications. Starting weight was 187 lbs in January. Has lost 25 lbs so far. Goal weight is 130-140 lbs.    Current interventions:  1. Diet - continues Keto diet. Doing well with water intake.  2. Activity - Physical activity is outdoor activity with kids.  3. Reports bowel movements are a little irregular currently, has not had a BM in 3-4 days.  Notes that this is not unusual for her to have bouts of constipation and bloating.    The following portions of the patient's history were reviewed and updated as appropriate: allergies, current medications, past family history, past medical history, past social history, past surgical history and problem list.  Review of Systems Pertinent items noted in HPI and remainder of comprehensive ROS otherwise negative.   Objective:    Vitals with BMI 05/05/2020 04/05/2020 02/22/2020  Height 5\' 7"  5\' 7"  5\' 7"   Weight 157 lbs 3 oz 162 lbs 8 oz 168 lbs 13 oz  BMI 24.62 25.45 26.43  Systolic 130 134  Diastolic 93 96 88  Pulse 106 102 105    General appearance: alert and no distress Abdomen: soft, non-tender.  Waist circumference 28 in.    Labs:  No new labs.   Assessment:   Weight management. Overweight, Body mass index is 24.62 kg/m.  Constipation  Plan:   1. Patient now with normal BMI. Can discontinue Phentermine at this time. Can continue use of Topamax for weight loss (also has h/o migraines and can use for prophylaxis). Continue to encourage diet, physical activity, and increasing water intake.  2. Constipation, can use OTC meds/laxatives as needed.  3. RTC in 2 months  for scheduled annual physical.    , MD [facility]

## 2020-05-05 NOTE — Addendum Note (Signed)
Addended by: Fabian November on: 05/05/2020 02:07 PM   Modules accepted: Level of Service

## 2020-05-05 NOTE — Progress Notes (Signed)
Pt present for weight management. Pt's last visit wt 162lbs. Pt stated that she was doing well no problems.

## 2020-07-04 ENCOUNTER — Emergency Department
Admission: EM | Admit: 2020-07-04 | Discharge: 2020-07-04 | Disposition: A | Discharge status: Home / Self Care | Payer: Self-pay | Attending: Emergency Medicine | Admitting: Emergency Medicine

## 2020-07-04 ENCOUNTER — Other Ambulatory Visit: Payer: Self-pay

## 2020-07-04 ENCOUNTER — Encounter: Payer: BC Managed Care – PPO | Admitting: Obstetrics and Gynecology

## 2020-07-04 ENCOUNTER — Emergency Department: Payer: Self-pay

## 2020-07-04 DIAGNOSIS — I10 Essential (primary) hypertension: Secondary | ICD-10-CM | POA: Insufficient documentation

## 2020-07-04 DIAGNOSIS — R0789 Other chest pain: Secondary | ICD-10-CM | POA: Insufficient documentation

## 2020-07-04 LAB — TROPONIN I (HIGH SENSITIVITY)
Troponin I (High Sensitivity): <2 ng/L (ref <18)
Troponin I (High Sensitivity): <2 ng/L (ref <18)

## 2020-07-04 LAB — BASIC METABOLIC PANEL
Anion gap: 6 (ref 5–15)
BUN: 20 mg/dL (ref 6–20)
CO2: 26 mmol/L (ref 22–32)
Calcium: 9 mg/dL (ref 8.9–10.3)
Chloride: 105 mmol/L (ref 98–111)
Creatinine, Ser: 0.83 mg/dL (ref 0.44–1.00)
GFR calc Af Amer: >60 mL/min (ref >60)
GFR calc non Af Amer: >60 mL/min (ref >60)
Glucose, Bld: 101 mg/dL — ABNORMAL HIGH (ref 70–99)
Potassium: 4.2 mmol/L (ref 3.5–5.1)
Sodium: 137 mmol/L (ref 135–145)

## 2020-07-04 LAB — CBC
HCT: 38.6 % (ref 36.0–46.0)
Hemoglobin: 12.9 g/dL (ref 12.0–15.0)
MCH: 31.8 pg (ref 26.0–34.0)
MCHC: 33.4 g/dL (ref 30.0–36.0)
MCV: 95.1 fL (ref 80.0–100.0)
Platelets: 280 10*3/uL (ref 150–400)
RBC: 4.06 MIL/uL (ref 3.87–5.11)
RDW: 13.4 % (ref 11.5–15.5)
WBC: 12.7 10*3/uL — ABNORMAL HIGH (ref 4.0–10.5)
nRBC: 0 % (ref 0.0–0.2)

## 2020-07-04 MED ORDER — PANTOPRAZOLE SODIUM 20 MG PO TBEC
20.0000 mg | DELAYED_RELEASE_TABLET | Freq: Every day | ORAL | 1 refills | Status: DC
Start: 2020-07-04 — End: 2021-05-09

## 2020-07-04 NOTE — ED Triage Notes (Signed)
Pt c/o of R sided CP that woke her up in the night. C/o tightness and fingers feel numb. Also co bruise to R arm but denies injury. States started drinking energy drinks this weekend.   A&O, ambulatory.

## 2020-07-04 NOTE — ED Provider Notes (Signed)
[facility] Emergency Department Provider Note   ____________________________________________    I have reviewed the triage vital signs and the nursing notes.   HISTORY  Chief Complaint Chest Pain     HPI Sarah Eaton is a 34 y.o. female who presents with complaints of right-sided chest pain that woke her up in the night.  She reports that at 1 time her fingers felt numb, no shortness of breath, no pleurisy, feeling improved now.  No nausea vomiting or diaphoresis.  No calf pain or swelling.  No history of the same.  Has not take anything for this.  No radiation of pain.  Past Medical History:  Diagnosis Date  . Hypertension   . Obesity     Patient Active Problem List   Diagnosis Date Noted  . Overweight (BMI 25.0-29.9) 10/20/2019    Past Surgical History:  Procedure Laterality Date  . CESAREAN SECTION    . CESAREAN SECTION WITH BILATERAL TUBAL LIGATION Bilateral 08/05/2013   Procedure: REPEAT CESAREAN SECTION WITH BILATERAL TUBAL LIGATION;  Surgeon: Allie Bossier, MD;  Location: [facility];  Service: Obstetrics;  Laterality: Bilateral;  . TUBAL LIGATION      Prior to Admission medications   Medication Sig Start Date End Date Taking? Authorizing Provider  cyanocobalamin (,VITAMIN B-12,) 1000 MCG/ML injection Inject 1 mL (1,000 mcg total) into the muscle every 30 (thirty) days. Patient not taking: Reported on 02/22/2020 05/26/19   Shambley, Melody N, CNM  pantoprazole (PROTONIX) 20 MG tablet Take 1 tablet (20 mg total) by mouth daily. 07/04/20 07/04/21  Jene Every, MD  phentermine (ADIPEX-P) 37.5 MG tablet Take 1 tablet (37.5 mg total) by mouth daily before breakfast. Alternate every other day with 2 tablets 04/05/20   Hildred Laser, MD  rizatriptan (MAXALT) 10 MG tablet Take 1 tablet (10 mg total) by mouth as needed for migraine. May repeat in 2 hours if needed 03/29/20   Hildred Laser, MD  topiramate (TOPAMAX) 100 MG tablet Take 1 tablet (100 mg  total) by mouth 2 (two) times daily. 07/01/19   Shambley, Melody N, CNM  valACYclovir (VALTREX) 1000 MG tablet Take 1 tablet (1,000 mg total) by mouth 2 (two) times daily. Take for ten days. 03/08/20   Hildred Laser, MD  zolpidem (AMBIEN) 5 MG tablet Take 5 mg by mouth at bedtime as needed for sleep. Patient not taking: Reported on 05/05/2020    [provider]     Allergies Banana and [place] Bing allergy]  Family History  Problem Relation Age of Onset  . Cancer Other        breast  . Hypertension Mother   . Cancer Mother        skin  . Thyroid disease Mother   . Cancer Maternal Grandmother   . Hypertension Maternal Grandmother   . Thyroid disease Father   . Thyroid disease Paternal Grandmother     Social History Social History   Tobacco Use  . Smoking status: Never Smoker  . Smokeless tobacco: Never Used  Vaping Use  . Vaping Use: Never used  Substance Use Topics  . Alcohol use: Yes    Comment: occasionally  . Drug use: Never    Review of Systems  Constitutional: No fever/chills Eyes: No visual changes.  ENT: No sore throat. Cardiovascular: As above Respiratory: Denies shortness of breath. Gastrointestinal: No abdominal pain.  No nausea, no vomiting.   Genitourinary: Negative for dysuria. Musculoskeletal: Negative for back pain. Skin: Negative for  rash. Neurological: Negative for headaches or weakness   ____________________________________________   PHYSICAL EXAM:  VITAL SIGNS: ED Triage Vitals  Enc Vitals Group     BP 07/04/20 0843 (!) 135/102     Pulse Rate 07/04/20 0843 77     Resp 07/04/20 0843 16     Temp 07/04/20 0843 99 F (37.2 C)     Temp Source 07/04/20 0843 Oral     SpO2 07/04/20 0843 100 %     Weight 07/04/20 0842 72.6 kg (160 lb)     Height 07/04/20 0842 1.6 m (5\' 3" )     Head Circumference --      Peak Flow --      Pain Score 07/04/20 0842 6     Pain Loc --      Pain Edu? --      Excl. in GC? --     Constitutional: Alert  and oriented. No acute distress.   Nose: No congestion/rhinnorhea. Mouth/Throat: Mucous membranes are moist.    Cardiovascular: Normal rate, regular rhythm. Grossly normal heart sounds.  Good peripheral circulation. Respiratory: Normal respiratory effort.  No retractions. Lungs CTAB. Gastrointestinal: Soft and nontender. No distention.   Musculoskeletal: No lower extremity tenderness nor edema.  Warm and well perfused Neurologic:  Normal speech and language. No gross focal neurologic deficits are appreciated.  Skin:  Skin is warm, dry and intact. No rash noted. Psychiatric: Mood and affect are normal. Speech and behavior are normal.  ____________________________________________   LABS (all labs ordered are listed, but only abnormal results are displayed)  Labs Reviewed  BASIC METABOLIC PANEL - Abnormal; Notable for the following components:      Result Value   Glucose, Bld 101 (*)    All other components within normal limits  CBC - Abnormal; Notable for the following components:   WBC 12.7 (*)    All other components within normal limits  TROPONIN I (HIGH SENSITIVITY)  TROPONIN I (HIGH SENSITIVITY)   ____________________________________________  EKG  ED ECG REPORT I, 07/06/20, the attending physician, personally viewed and interpreted this ECG.  Date: 07/04/2020  Rhythm: normal sinus rhythm QRS Axis: normal Intervals: normal ST/T Wave abnormalities: normal Narrative Interpretation: no evidence of acute ischemia  ____________________________________________  RADIOLOGY  Chest x-ray viewed by me, no infiltrate effusion or pneumothorax ____________________________________________   PROCEDURES  Procedure(s) performed: No  Procedures   Critical Care performed: No ____________________________________________   INITIAL IMPRESSION / ASSESSMENT AND PLAN / ED COURSE  Pertinent labs & imaging results that were available during my care of the patient were  reviewed by me and considered in my medical decision making (see chart for details).  Patient well-appearing and in no acute distress.  Symptoms not consistent with ACS peritonitis or myocarditis, suspicious for GERD/acid reflux.  Delta troponins undetectable, mildly elevated white blood cell count is nonspecific, chest x-ray is reassuring.  Patient is asymptomatic here in the emergency department, appropriate discharge at this time.  Outpatient follow-up recommended.    ____________________________________________   FINAL CLINICAL IMPRESSION(S) / ED DIAGNOSES  Final diagnoses:  Atypical chest pain        Note:  This document was prepared using Dragon voice recognition software and may include unintentional dictation errors.   07/06/2020, MD 07/04/20 2121

## 2020-08-12 IMAGING — DX DG CHEST 2V
2 series · 2 of 2 positions shown · non-contrast
Comparison: Multiple prior most recent 04/08/2016, 06/11/2014

CLINICAL DATA: 32-year-old female with a history of shortness of
breath.

EXAM:
CHEST - 2 VIEW

[chest pa]
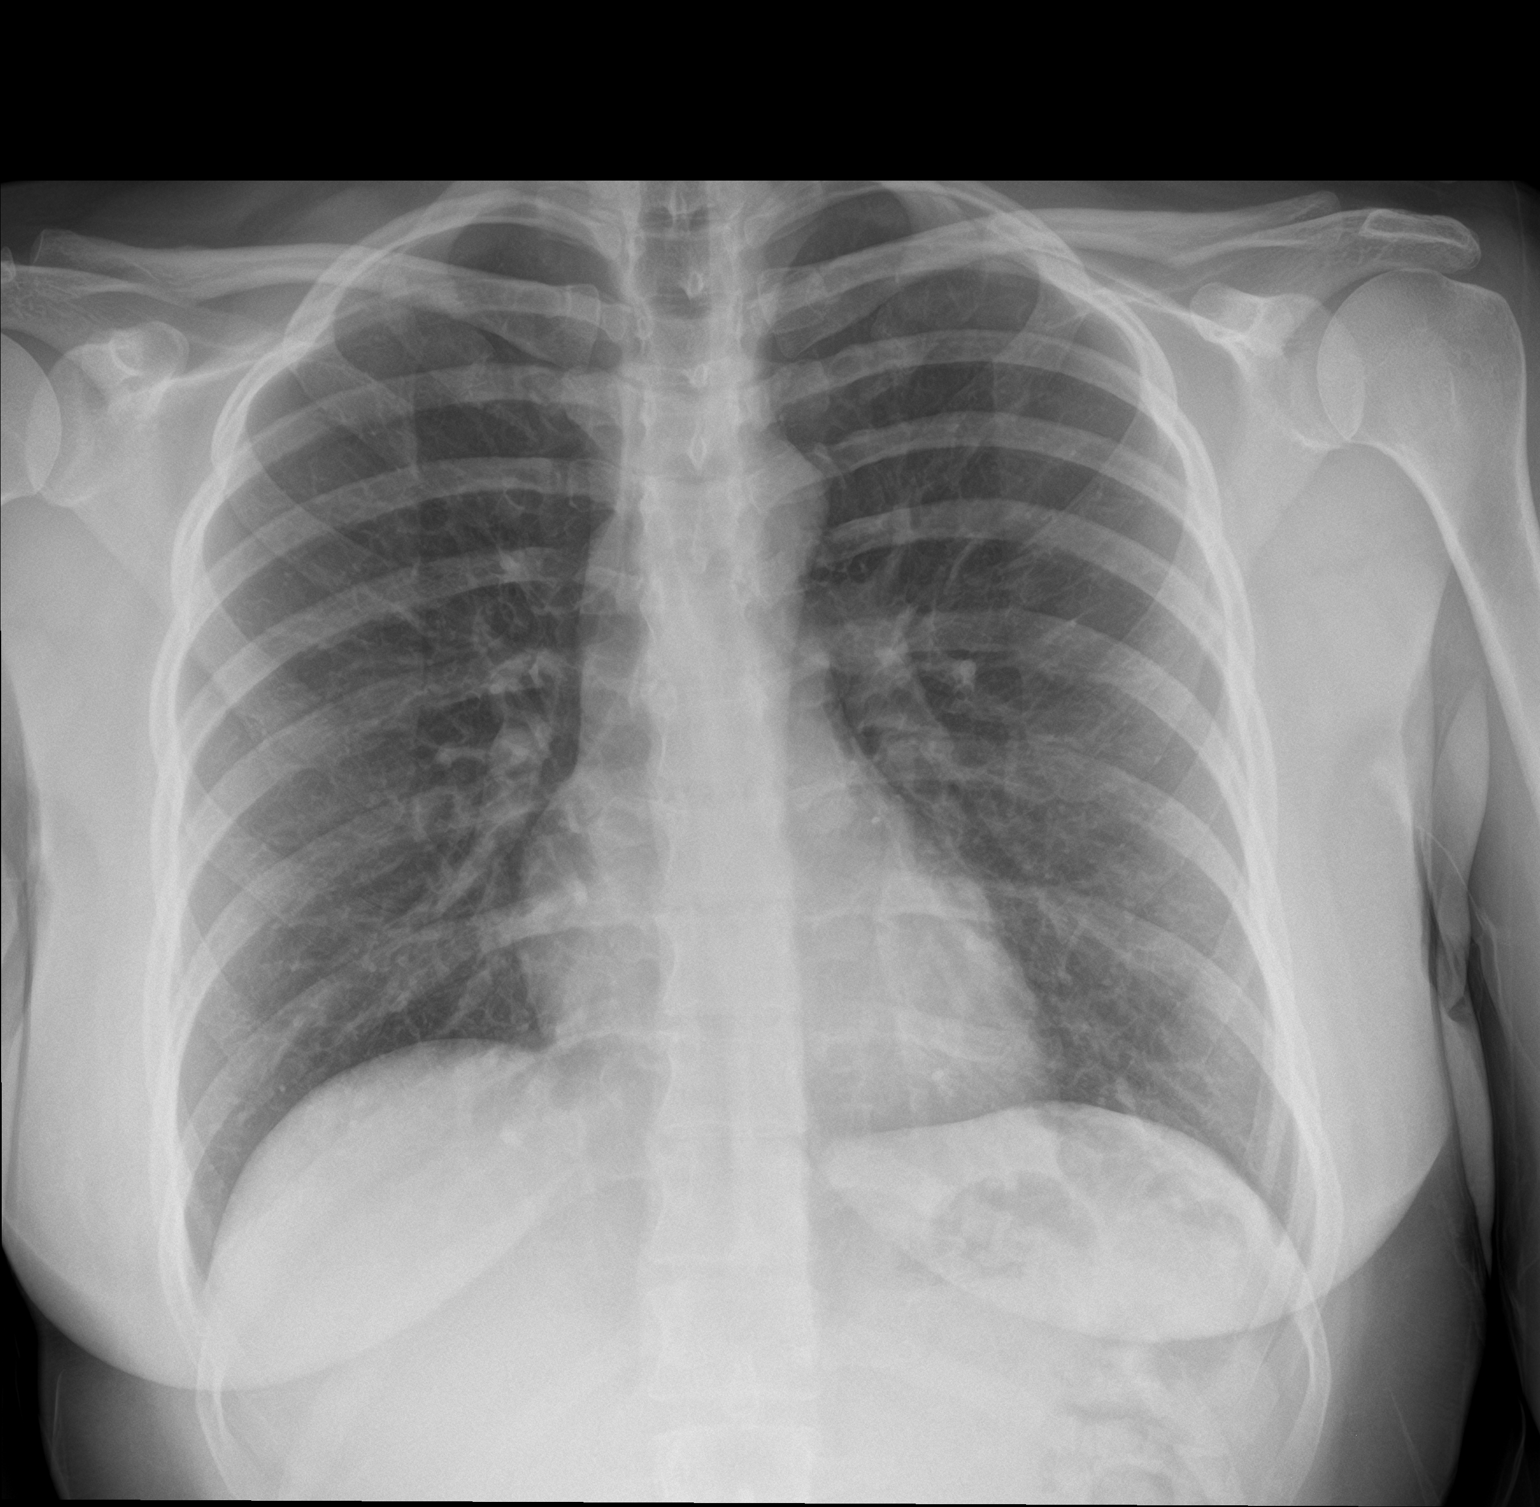

[chest lat]
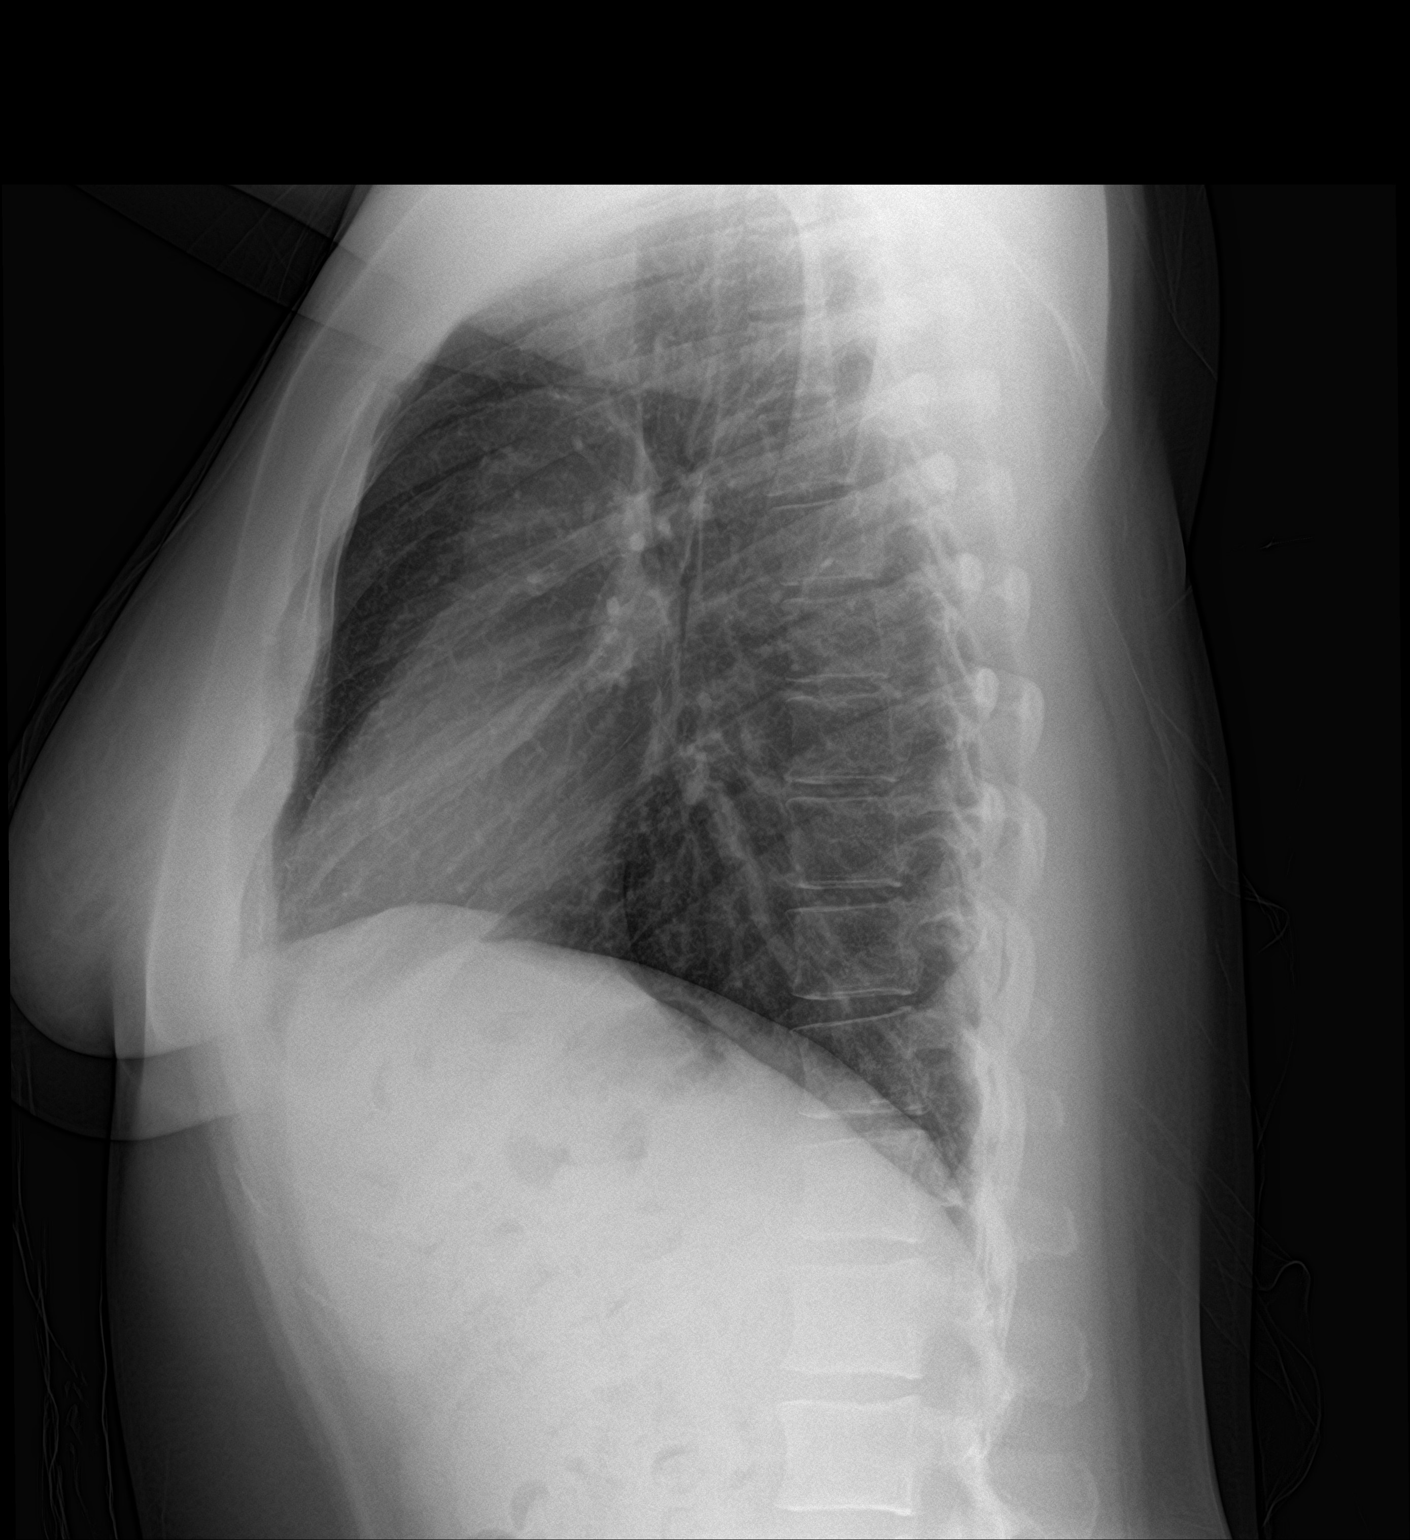

[2 of 2 positions shown; findings below may reference images not displayed]

FINDINGS: Cardiomediastinal silhouette unchanged in size and contour. No
evidence of central vascular congestion. No interlobular septal
thickening. No confluent airspace disease. No pleural effusion or
pneumothorax.

No displaced fracture.
IMPRESSION: Negative for acute cardiopulmonary disease

## 2020-08-16 NOTE — Progress Notes (Deleted)
Pt present for annual exam. Pt stated  

## 2020-08-18 ENCOUNTER — Encounter: Payer: Self-pay | Admitting: Obstetrics and Gynecology

## 2020-08-22 ENCOUNTER — Encounter: Payer: Self-pay | Admitting: Obstetrics and Gynecology

## 2020-10-24 ENCOUNTER — Telehealth: Payer: Self-pay

## 2020-10-24 NOTE — Telephone Encounter (Signed)
mychart message sent to patient

## 2021-05-04 ENCOUNTER — Emergency Department (HOSPITAL_COMMUNITY): Payer: 59

## 2021-05-04 ENCOUNTER — Inpatient Hospital Stay (HOSPITAL_COMMUNITY)
Admission: EM | Admit: 2021-05-04 | Discharge: 2021-05-09 | DRG: 872 | Disposition: A | Discharge status: Home / Self Care | Payer: 59 | Attending: Internal Medicine | Admitting: Internal Medicine

## 2021-05-04 ENCOUNTER — Encounter (HOSPITAL_COMMUNITY): Payer: Self-pay | Admitting: Emergency Medicine

## 2021-05-04 DIAGNOSIS — G43709 Chronic migraine without aura, not intractable, without status migrainosus: Secondary | ICD-10-CM

## 2021-05-04 DIAGNOSIS — N83202 Unspecified ovarian cyst, left side: Secondary | ICD-10-CM | POA: Diagnosis present

## 2021-05-04 DIAGNOSIS — Z79899 Other long term (current) drug therapy: Secondary | ICD-10-CM

## 2021-05-04 DIAGNOSIS — A419 Sepsis, unspecified organism: Secondary | ICD-10-CM

## 2021-05-04 DIAGNOSIS — K219 Gastro-esophageal reflux disease without esophagitis: Secondary | ICD-10-CM | POA: Diagnosis present

## 2021-05-04 DIAGNOSIS — I10 Essential (primary) hypertension: Secondary | ICD-10-CM | POA: Diagnosis present

## 2021-05-04 DIAGNOSIS — K5900 Constipation, unspecified: Secondary | ICD-10-CM | POA: Diagnosis present

## 2021-05-04 DIAGNOSIS — R7881 Bacteremia: Secondary | ICD-10-CM | POA: Diagnosis not present

## 2021-05-04 DIAGNOSIS — Z8249 Family history of ischemic heart disease and other diseases of the circulatory system: Secondary | ICD-10-CM | POA: Diagnosis not present

## 2021-05-04 DIAGNOSIS — B9689 Other specified bacterial agents as the cause of diseases classified elsewhere: Secondary | ICD-10-CM

## 2021-05-04 DIAGNOSIS — A4151 Sepsis due to Escherichia coli [E. coli]: Secondary | ICD-10-CM | POA: Diagnosis present

## 2021-05-04 DIAGNOSIS — N83209 Unspecified ovarian cyst, unspecified side: Secondary | ICD-10-CM | POA: Diagnosis not present

## 2021-05-04 DIAGNOSIS — N12 Tubulo-interstitial nephritis, not specified as acute or chronic: Secondary | ICD-10-CM

## 2021-05-04 DIAGNOSIS — R9389 Abnormal findings on diagnostic imaging of other specified body structures: Secondary | ICD-10-CM

## 2021-05-04 DIAGNOSIS — G43909 Migraine, unspecified, not intractable, without status migrainosus: Secondary | ICD-10-CM

## 2021-05-04 DIAGNOSIS — Z1629 Resistance to other single specified antibiotic: Secondary | ICD-10-CM | POA: Diagnosis present

## 2021-05-04 DIAGNOSIS — Z20822 Contact with and (suspected) exposure to covid-19: Secondary | ICD-10-CM | POA: Diagnosis present

## 2021-05-04 DIAGNOSIS — R059 Cough, unspecified: Secondary | ICD-10-CM

## 2021-05-04 DIAGNOSIS — Z91018 Allergy to other foods: Secondary | ICD-10-CM

## 2021-05-04 HISTORY — DX: Sepsis due to Escherichia coli (e. coli): A41.51

## 2021-05-04 HISTORY — DX: Tubulo-interstitial nephritis, not specified as acute or chronic: N12

## 2021-05-04 LAB — LACTIC ACID, PLASMA
Lactic Acid, Venous: 1 mmol/L (ref 0.5–1.9)
Lactic Acid, Venous: 0.7 mmol/L (ref 0.5–1.9)

## 2021-05-04 LAB — URINALYSIS, ROUTINE W REFLEX MICROSCOPIC
Bilirubin Urine: NEGATIVE
Glucose, UA: NEGATIVE mg/dL
Hgb urine dipstick: NEGATIVE
Ketones, ur: NEGATIVE mg/dL
Leukocytes,Ua: NEGATIVE
Nitrite: NEGATIVE
Protein, ur: NEGATIVE mg/dL
Specific Gravity, Urine: 1.025 (ref 1.005–1.030)
pH: 5 (ref 5.0–8.0)

## 2021-05-04 LAB — COMPREHENSIVE METABOLIC PANEL
ALT: 62 U/L — ABNORMAL HIGH (ref 0–44)
AST: 27 U/L (ref 15–41)
Albumin: 2.9 g/dL — ABNORMAL LOW (ref 3.5–5.0)
Alkaline Phosphatase: 91 U/L (ref 38–126)
Anion gap: 10 (ref 5–15)
BUN: 10 mg/dL (ref 6–20)
CO2: 24 mmol/L (ref 22–32)
Calcium: 8.7 mg/dL — ABNORMAL LOW (ref 8.9–10.3)
Chloride: 100 mmol/L (ref 98–111)
Creatinine, Ser: 0.72 mg/dL (ref 0.44–1.00)
GFR, Estimated: >60 mL/min (ref >60)
Glucose, Bld: 102 mg/dL — ABNORMAL HIGH (ref 70–99)
Potassium: 3.8 mmol/L (ref 3.5–5.1)
Sodium: 134 mmol/L — ABNORMAL LOW (ref 135–145)
Total Bilirubin: 0.6 mg/dL (ref 0.3–1.2)
Total Protein: 7.5 g/dL (ref 6.5–8.1)

## 2021-05-04 LAB — CBC
HCT: 34.5 % — ABNORMAL LOW (ref 36.0–46.0)
Hemoglobin: 11.1 g/dL — ABNORMAL LOW (ref 12.0–15.0)
MCH: 29.9 pg (ref 26.0–34.0)
MCHC: 32.2 g/dL (ref 30.0–36.0)
MCV: 93 fL (ref 80.0–100.0)
Platelets: 865 10*3/uL — ABNORMAL HIGH (ref 150–400)
RBC: 3.71 MIL/uL — ABNORMAL LOW (ref 3.87–5.11)
RDW: 12.1 % (ref 11.5–15.5)
WBC: 13.8 10*3/uL — ABNORMAL HIGH (ref 4.0–10.5)
nRBC: 0 % (ref 0.0–0.2)

## 2021-05-04 LAB — RESP PANEL BY RT-PCR (FLU A&B, COVID) ARPGX2
Influenza A by PCR: NEGATIVE
Influenza B by PCR: NEGATIVE
SARS Coronavirus 2 by RT PCR: NEGATIVE

## 2021-05-04 LAB — I-STAT BETA HCG BLOOD, ED (MC, WL, AP ONLY): I-stat hCG, quantitative: <5 m[IU]/mL (ref <5)

## 2021-05-04 MED ORDER — ACETAMINOPHEN 325 MG PO TABS
650.0000 mg | ORAL_TABLET | Freq: Four times a day (QID) | ORAL | Status: DC | PRN
  Administered 2021-05-04 – 2021-05-05 (×2): 650 mg via ORAL
  Filled 2021-05-04 (×2): qty 2

## 2021-05-04 MED ORDER — LACTATED RINGERS IV SOLN: INTRAVENOUS | Status: DC

## 2021-05-04 MED ORDER — FENTANYL CITRATE (PF) 100 MCG/2ML IJ SOLN
12.5000 ug | INTRAMUSCULAR | Status: DC | PRN
Start: 2021-05-04 — End: 2021-05-09
  Administered 2021-05-04 – 2021-05-09 (×26): 50 ug via INTRAVENOUS
  Filled 2021-05-04 (×26): qty 2

## 2021-05-04 MED ORDER — OXYCODONE HCL 5 MG PO TABS
5.0000 mg | ORAL_TABLET | ORAL | Status: DC | PRN
Start: 2021-05-04 — End: 2021-05-05
  Administered 2021-05-04 – 2021-05-05 (×3): 5 mg via ORAL
  Filled 2021-05-04 (×3): qty 1

## 2021-05-04 MED ORDER — OXYCODONE-ACETAMINOPHEN 5-325 MG PO TABS
2.0000 | ORAL_TABLET | Freq: Once | ORAL | Status: AC
  Administered 2021-05-04: 2 via ORAL
  Filled 2021-05-04: qty 2

## 2021-05-04 MED ORDER — METOPROLOL TARTRATE 5 MG/5ML IV SOLN: 5.0000 mg | Freq: Four times a day (QID) | INTRAVENOUS | Status: DC | PRN

## 2021-05-04 MED ORDER — SODIUM CHLORIDE 0.9 % IV SOLN
1.0000 g | Freq: Three times a day (TID) | INTRAVENOUS | Status: DC
  Administered 2021-05-04 – 2021-05-05 (×3): 1 g via INTRAVENOUS
  Filled 2021-05-04 (×9): qty 1

## 2021-05-04 MED ORDER — ACETAMINOPHEN 325 MG PO TABS
650.0000 mg | ORAL_TABLET | Freq: Once | ORAL | Status: AC
  Administered 2021-05-04: 650 mg via ORAL
  Filled 2021-05-04: qty 2

## 2021-05-04 MED ORDER — PANTOPRAZOLE SODIUM 20 MG PO TBEC
20.0000 mg | DELAYED_RELEASE_TABLET | Freq: Every day | ORAL | Status: DC
  Administered 2021-05-05 – 2021-05-09 (×5): 20 mg via ORAL
  Filled 2021-05-04 (×5): qty 1

## 2021-05-04 MED ORDER — ONDANSETRON HCL 4 MG PO TABS: 4.0000 mg | ORAL_TABLET | Freq: Four times a day (QID) | ORAL | Status: DC | PRN

## 2021-05-04 MED ORDER — SODIUM CHLORIDE 0.9 % IV SOLN
1.0000 g | Freq: Once | INTRAVENOUS | Status: AC
  Administered 2021-05-04: 1 g via INTRAVENOUS
  Filled 2021-05-04: qty 10

## 2021-05-04 MED ORDER — FENTANYL CITRATE (PF) 100 MCG/2ML IJ SOLN
50.0000 ug | Freq: Once | INTRAMUSCULAR | Status: AC
  Administered 2021-05-04: 50 ug via INTRAVENOUS
  Filled 2021-05-04: qty 2

## 2021-05-04 MED ORDER — ACETAMINOPHEN 325 MG PO TABS: 325.0000 mg | ORAL_TABLET | Freq: Four times a day (QID) | ORAL | Status: DC | PRN

## 2021-05-04 MED ORDER — ONDANSETRON HCL 4 MG/2ML IJ SOLN
4.0000 mg | Freq: Four times a day (QID) | INTRAMUSCULAR | Status: DC | PRN
  Administered 2021-05-05: 4 mg via INTRAVENOUS
  Filled 2021-05-04: qty 2

## 2021-05-04 MED ORDER — PIPERACILLIN-TAZOBACTAM 3.375 G IVPB: 3.3750 g | Freq: Three times a day (TID) | INTRAVENOUS | Status: DC

## 2021-05-04 MED ORDER — SODIUM CHLORIDE 0.9 % IV BOLUS
1000.0000 mL | Freq: Once | INTRAVENOUS | Status: AC
  Administered 2021-05-04: 1000 mL via INTRAVENOUS

## 2021-05-04 MED ORDER — ZOLPIDEM TARTRATE 5 MG PO TABS
5.0000 mg | ORAL_TABLET | Freq: Every evening | ORAL | Status: DC | PRN
  Administered 2021-05-04: 5 mg via ORAL
  Filled 2021-05-04: qty 1

## 2021-05-04 MED ORDER — ACETAMINOPHEN 650 MG RE SUPP: 650.0000 mg | Freq: Four times a day (QID) | RECTAL | Status: DC | PRN

## 2021-05-04 MED ORDER — IOHEXOL 300 MG/ML  SOLN
75.0000 mL | Freq: Once | INTRAMUSCULAR | Status: AC | PRN
  Administered 2021-05-04: 75 mL via INTRAVENOUS

## 2021-05-04 NOTE — ED Notes (Signed)
Pt just had 650mg  of tylenol at 7am at home

## 2021-05-04 NOTE — ED Notes (Signed)
Attempted to call report. Charge RN to assign pt to nurse. Extension given to call back.

## 2021-05-04 NOTE — Consult Note (Addendum)
Consultation: Left lobar nephronia  History of Present Illness: Sarah Eaton is a 35 year old female who was admitted to another institution with left pyelonephritis.  She has been on Augmentin.  She continued to have fevers chills and abdominal pain and sought care today.  She has no dysuria or gross hematuria.  Interestingly her urinalysis is clear.  She has a white count of 13.8 and creatinine 0.72.  She underwent CT scan of the abdomen and pelvis which shows a lobar nephronia/focal nephritis or to severe left pyelonephritis.  There was no focal abscess or need for a drain at this point with the area of most concern in the left upper pole about 2 x 3 cm.  She has had fevers to 102.4.  Past Medical History:  Diagnosis Date   Hypertension    Obesity    Past Surgical History:  Procedure Laterality Date   CESAREAN SECTION     CESAREAN SECTION WITH BILATERAL TUBAL LIGATION Bilateral 08/05/2013   Procedure: REPEAT CESAREAN SECTION WITH BILATERAL TUBAL LIGATION;  Surgeon: Allie Bossier, MD;  Location: [facility];  Service: Obstetrics;  Laterality: Bilateral;   TUBAL LIGATION      Home Medications:  (Not in a hospital admission)  Allergies:  Allergies  Allergen Reactions   Banana Anaphylaxis   [place] Gum [Fish Allergy] Anaphylaxis    Family History  Problem Relation Age of Onset   Cancer Other        breast   Hypertension Mother    Cancer Mother        skin   Thyroid disease Mother    Cancer Maternal Grandmother    Hypertension Maternal Grandmother    Thyroid disease Father    Thyroid disease Paternal Grandmother    Social History:  reports that she has never smoked. She has never used smokeless tobacco. She reports current alcohol use. She reports that she does not use drugs.  ROS: A complete review of systems was performed.  All systems are negative except for pertinent findings as noted. Review of Systems  Constitutional:  Positive for fever.  Gastrointestinal:  Positive for abdominal pain.   All other systems reviewed and are negative.   Physical Exam:  Vital signs in last 24 hours: Temp:  [98 F (36.7 C)-102.4 F (39.1 C)] 98 F (36.7 C) (06/17 1730) Pulse Rate:  [83-120] 83 (06/17 1745) Resp:  [15-25] 16 (06/17 1745) BP: (97-127)/(56-92) 106/65 (06/17 1745) SpO2:  [95 %-100 %] 99 % (06/17 1745) General:  Alert and oriented, No acute distress HEENT: Normocephalic, atraumatic Neck: No JVD or lymphadenopathy Cardiovascular: Regular rate and rhythm Lungs: Regular rate and effort Abdomen: Soft, nontender, nondistended, no abdominal masses Back: No CVA tenderness Extremities: No edema Neurologic: Grossly intact  Laboratory Data:  Results for orders placed or performed during the hospital encounter of 05/04/21 (from the past 24 hour(s))  CBC     Status: Abnormal   Collection Time: 05/04/21  9:00 AM  Result Value Ref Range   WBC 13.8 (H) 4.0 - 10.5 K/uL   RBC 3.71 (L) 3.87 - 5.11 MIL/uL   Hemoglobin 11.1 (L) 12.0 - 15.0 g/dL   HCT 21.3 (L) 08.6 - 57.8 %   MCV 93.0 80.0 - 100.0 fL   MCH 29.9 26.0 - 34.0 pg   MCHC 32.2 30.0 - 36.0 g/dL   RDW 46.9 62.9 - 52.8 %   Platelets 865 (H) 150 - 400 K/uL   nRBC 0.0 0.0 - 0.2 %  Comprehensive metabolic panel  Status: Abnormal   Collection Time: 05/04/21  9:00 AM  Result Value Ref Range   Sodium 134 (L) 135 - 145 mmol/L   Potassium 3.8 3.5 - 5.1 mmol/L   Chloride 100 98 - 111 mmol/L   CO2 24 22 - 32 mmol/L   Glucose, Bld 102 (H) 70 - 99 mg/dL   BUN 10 6 - 20 mg/dL   Creatinine, Ser 2.26 0.44 - 1.00 mg/dL   Calcium 8.7 (L) 8.9 - 10.3 mg/dL   Total Protein 7.5 6.5 - 8.1 g/dL   Albumin 2.9 (L) 3.5 - 5.0 g/dL   AST 27 15 - 41 U/L   ALT 62 (H) 0 - 44 U/L   Alkaline Phosphatase 91 38 - 126 U/L   Total Bilirubin 0.6 0.3 - 1.2 mg/dL   GFR, Estimated >33 >35 mL/min   Anion gap 10 5 - 15  Lactic acid, plasma     Status: None   Collection Time: 05/04/21  9:01 AM  Result Value Ref Range   Lactic Acid, Venous 1.0 0.5 -  1.9 mmol/L  Urinalysis, Routine w reflex microscopic Urine, Clean Catch     Status: None   Collection Time: 05/04/21  9:01 AM  Result Value Ref Range   Color, Urine YELLOW YELLOW   APPearance CLEAR CLEAR   Specific Gravity, Urine 1.025 1.005 - 1.030   pH 5.0 5.0 - 8.0   Glucose, UA NEGATIVE NEGATIVE mg/dL   Hgb urine dipstick NEGATIVE NEGATIVE   Bilirubin Urine NEGATIVE NEGATIVE   Ketones, ur NEGATIVE NEGATIVE mg/dL   Protein, ur NEGATIVE NEGATIVE mg/dL   Nitrite NEGATIVE NEGATIVE   Leukocytes,Ua NEGATIVE NEGATIVE  Resp Panel by RT-PCR (Flu A&B, Covid) Nasopharyngeal Swab     Status: None   Collection Time: 05/04/21 10:08 AM   Specimen: Nasopharyngeal Swab; Nasopharyngeal(NP) swabs in vial transport medium  Result Value Ref Range   SARS Coronavirus 2 by RT PCR NEGATIVE NEGATIVE   Influenza A by PCR NEGATIVE NEGATIVE   Influenza B by PCR NEGATIVE NEGATIVE  I-Stat beta hCG blood, ED     Status: None   Collection Time: 05/04/21 10:35 AM  Result Value Ref Range   I-stat hCG, quantitative <5.0 <5 mIU/mL   Comment 3          Lactic acid, plasma     Status: None   Collection Time: 05/04/21 12:01 PM  Result Value Ref Range   Lactic Acid, Venous 0.7 0.5 - 1.9 mmol/L   Recent Results (from the past 240 hour(s))  Resp Panel by RT-PCR (Flu A&B, Covid) Nasopharyngeal Swab     Status: None   Collection Time: 05/04/21 10:08 AM   Specimen: Nasopharyngeal Swab; Nasopharyngeal(NP) swabs in vial transport medium  Result Value Ref Range Status   SARS Coronavirus 2 by RT PCR NEGATIVE NEGATIVE Final    Comment: (NOTE) SARS-CoV-2 target nucleic acids are NOT DETECTED.  The SARS-CoV-2 RNA is generally detectable in upper respiratory specimens during the acute phase of infection. The lowest concentration of SARS-CoV-2 viral copies this assay can detect is 138 copies/mL. A negative result does not preclude SARS-Cov-2 infection and should not be used as the sole basis for treatment or other  patient management decisions. A negative result may occur with  improper specimen collection/handling, submission of specimen other than nasopharyngeal swab, presence of viral mutation(s) within the areas targeted by this assay, and inadequate number of viral copies(<138 copies/mL). A negative result must be combined with clinical observations, patient history,  and epidemiological information. The expected result is Negative.  Fact Sheet for Patients:  BloggerCourse.com  Fact Sheet for Healthcare Providers:  SeriousBroker.it  This test is no t yet approved or cleared by the [place] FDA and  has been authorized for detection and/or diagnosis of SARS-CoV-2 by FDA under an Emergency Use Authorization (EUA). This EUA will remain  in effect (meaning this test can be used) for the duration of the COVID-19 declaration under Section 564(b)(1) of the Act, 21 U.S.C.section 360bbb-3(b)(1), unless the authorization is terminated  or revoked sooner.       Influenza A by PCR NEGATIVE NEGATIVE Final   Influenza B by PCR NEGATIVE NEGATIVE Final    Comment: (NOTE) The Xpert Xpress SARS-CoV-2/FLU/RSV plus assay is intended as an aid in the diagnosis of influenza from Nasopharyngeal swab specimens and should not be used as a sole basis for treatment. Nasal washings and aspirates are unacceptable for Xpert Xpress SARS-CoV-2/FLU/RSV testing.  Fact Sheet for Patients: BloggerCourse.com  Fact Sheet for Healthcare Providers: SeriousBroker.it  This test is not yet approved or cleared by the [place] FDA and has been authorized for detection and/or diagnosis of SARS-CoV-2 by FDA under an Emergency Use Authorization (EUA). This EUA will remain in effect (meaning this test can be used) for the duration of the COVID-19 declaration under Section 564(b)(1) of the Act, 21 U.S.C. section  360bbb-3(b)(1), unless the authorization is terminated or revoked.  Performed at [facility], [address] [place], [place] [address]    Creatinine: Recent Labs    05/04/21 0900  CREATININE 0.72    Impression/Assessment/plan:  Left pyelonephritis with lobar nephronia/focal bacterial nephritis -I do not believe she has a drainable fluid collection at this point and I reviewed her images with IR who agreed.  Although IR could not place a drain they may be able to aspirate the left upper kidney if that would be helpful to obtain a sample for culture. IR recommended a renal US to se if there is anything to aspirate from the left upper kidney via US guidance.  I would recommend consulting infectious disease to consider antibiotics and dosing and if they would find a renal aspirate for sample/culture helpful given her UA is normal.    Jerilee Field 05/04/2021, 6:13 PM

## 2021-05-04 NOTE — ED Triage Notes (Signed)
Pt here from home with continued fever recently discharged from [facility] for 11 days for sepsis/ pyelonephritis

## 2021-05-04 NOTE — ED Provider Notes (Signed)
[facility] EMERGENCY DEPARTMENT Provider Note   CSN: [id number] Arrival date & time: 05/04/21  0849     History No chief complaint on file.   Sarah Eaton is a 35 y.o. female.  Patient is a 35 year old female who presents with fever and chills with abdominal pain.  She was out of town in [place] this month and was admitted on June 3 to [facility] for sepsis and pyelonephritis.  She was admitted for 11 days and discharged with Augmentin which she is still taking.  She states that she still running fevers.  She is having a dry cough.  She has worsening pain across her lower abdomen.  She has no nausea or vomiting.  No change in stools.  No urinary symptoms.  No rashes.  On review of records, she did follow-up with her doctor at [facility] clinic and there was report that during the hospitalization there was a fluid collection on one of her kidneys.  I am currently unable to access these records.      Past Medical History:  Diagnosis Date   Hypertension    Obesity     Patient Active Problem List   Diagnosis Date Noted   Overweight (BMI 25.0-29.9) 10/20/2019    Past Surgical History:  Procedure Laterality Date   CESAREAN SECTION     CESAREAN SECTION WITH BILATERAL TUBAL LIGATION Bilateral 08/05/2013   Procedure: REPEAT CESAREAN SECTION WITH BILATERAL TUBAL LIGATION;  Surgeon: Emily Filbert, MD;  Location: [facility];  Service: Obstetrics;  Laterality: Bilateral;   TUBAL LIGATION       OB History     Gravida  2   Para  2   Term  2   Preterm      AB      Living  2      SAB      IAB      Ectopic      Multiple      Live Births  2           Family History  Problem Relation Age of Onset   Cancer Other        breast   Hypertension Mother    Cancer Mother        skin   Thyroid disease Mother    Cancer Maternal Grandmother    Hypertension Maternal Grandmother    Thyroid disease Father    Thyroid disease Paternal  Grandmother     Social History   Tobacco Use   Smoking status: Never   Smokeless tobacco: Never  Vaping Use   Vaping Use: Never used  Substance Use Topics   Alcohol use: Yes    Comment: occasionally   Drug use: Never    Home Medications Prior to Admission medications   Medication Sig Start Date End Date Taking? Authorizing Provider  acetaminophen (TYLENOL) 325 MG tablet Take 325 mg by mouth every 6 (six) hours as needed for mild pain.   Yes [provider]  amoxicillin-clavulanate (AUGMENTIN) 500-125 MG tablet Take 500 mg by mouth every 12 (twelve) hours.   Yes [provider]  oxyCODONE (OXY IR/ROXICODONE) 5 MG immediate release tablet Take 5 mg by mouth every 4 (four) hours as needed for moderate pain.   Yes [provider]  cyanocobalamin (,VITAMIN B-12,) 1000 MCG/ML injection Inject 1 mL (1,000 mcg total) into the muscle every 30 (thirty) days. Patient not taking: No sig reported 05/26/19   Gayla Medicus, Melody  N, CNM  pantoprazole (PROTONIX) 20 MG tablet Take 1 tablet (20 mg total) by mouth daily. Patient not taking: Reported on 05/04/2021 07/04/20 07/04/21  Lavonia Drafts, MD  phentermine (ADIPEX-P) 37.5 MG tablet Take 1 tablet (37.5 mg total) by mouth daily before breakfast. Alternate every other day with 2 tablets Patient not taking: Reported on 05/04/2021 04/05/20   Rubie Maid, MD  rizatriptan (MAXALT) 10 MG tablet Take 1 tablet (10 mg total) by mouth as needed for migraine. May repeat in 2 hours if needed Patient not taking: Reported on 05/04/2021 03/29/20   Rubie Maid, MD  topiramate (TOPAMAX) 100 MG tablet Take 1 tablet (100 mg total) by mouth 2 (two) times daily. Patient not taking: Reported on 05/04/2021 07/01/19   Shambley, Melody N, CNM  valACYclovir (VALTREX) 1000 MG tablet Take 1 tablet (1,000 mg total) by mouth 2 (two) times daily. Take for ten days. Patient not taking: Reported on 05/04/2021 03/08/20   Rubie Maid, MD  zolpidem (AMBIEN) 5 MG  tablet Take 5 mg by mouth at bedtime as needed for sleep. Patient not taking: No sig reported    [provider]    Allergies    Banana and [place] [fish allergy]  Review of Systems   Review of Systems  Constitutional:  Positive for chills and fever. Negative for diaphoresis and fatigue.  HENT:  Negative for congestion, rhinorrhea and sneezing.   Eyes: Negative.   Respiratory:  Positive for cough. Negative for chest tightness and shortness of breath.   Cardiovascular:  Negative for chest pain and leg swelling.  Gastrointestinal:  Positive for abdominal pain. Negative for blood in stool, diarrhea, nausea, rectal pain and vomiting.  Genitourinary:  Negative for difficulty urinating, flank pain, frequency and hematuria.  Musculoskeletal:  Negative for arthralgias and back pain.  Skin:  Negative for rash.  Neurological:  Negative for dizziness, speech difficulty, weakness, numbness and headaches.   Physical Exam Updated Vital Signs BP 106/63   Pulse (!) 101   Temp 99.5 F (37.5 C) (Oral)   Resp (!) 22   SpO2 97%   Physical Exam Constitutional:      Appearance: She is well-developed.  HENT:     Head: Normocephalic and atraumatic.  Eyes:     Pupils: Pupils are equal, round, and reactive to light.  Cardiovascular:     Rate and Rhythm: Normal rate and regular rhythm.     Heart sounds: Normal heart sounds.  Pulmonary:     Effort: Pulmonary effort is normal. No respiratory distress.     Breath sounds: Normal breath sounds. No wheezing or rales.  Chest:     Chest wall: No tenderness.  Abdominal:     General: Bowel sounds are normal.     Palpations: Abdomen is soft.     Tenderness: There is abdominal tenderness (Tenderness across the lower abdomen bilaterally). There is no guarding or rebound.  Musculoskeletal:        General: Normal range of motion.     Cervical back: Normal range of motion and neck supple.  Lymphadenopathy:     Cervical: No cervical adenopathy.   Skin:    General: Skin is warm and dry.     Findings: No rash.  Neurological:     Mental Status: She is alert and oriented to person, place, and time.    ED Results / Procedures / Treatments   Labs (all labs ordered are listed, but only abnormal results are displayed) Labs Reviewed  CBC - Abnormal;  Notable for the following components:      Result Value   WBC 13.8 (*)    RBC 3.71 (*)    Hemoglobin 11.1 (*)    HCT 34.5 (*)    Platelets 865 (*)    All other components within normal limits  COMPREHENSIVE METABOLIC PANEL - Abnormal; Notable for the following components:   Sodium 134 (*)    Glucose, Bld 102 (*)    Calcium 8.7 (*)    Albumin 2.9 (*)    ALT 62 (*)    All other components within normal limits  RESP PANEL BY RT-PCR (FLU A&B, COVID) ARPGX2  CULTURE, BLOOD (ROUTINE X 2)  CULTURE, BLOOD (ROUTINE X 2)  URINE CULTURE  LACTIC ACID, PLASMA  LACTIC ACID, PLASMA  URINALYSIS, ROUTINE W REFLEX MICROSCOPIC  I-STAT BETA HCG BLOOD, ED ([facility], [facility], [facility] ONLY)    EKG None  Radiology DG Chest 2 View  Result Date: 05/04/2021 CLINICAL DATA:  Cough and fever EXAM: CHEST - 2 VIEW COMPARISON:  July 04, 2020 FINDINGS: Lungs are clear. Heart size and pulmonary vascularity are normal. No adenopathy. No bone lesions. IMPRESSION: Lungs clear.  Cardiac silhouette normal. Electronically Signed   By: Lowella Grip III M.D.   On: 05/04/2021 09:17   CT Abdomen Pelvis W Contrast  Result Date: 05/04/2021 CLINICAL DATA:  Abdominal pain and fever EXAM: CT ABDOMEN AND PELVIS WITH CONTRAST TECHNIQUE: Multidetector CT imaging of the abdomen and pelvis was performed using the standard protocol following bolus administration of intravenous contrast. CONTRAST:  35m OMNIPAQUE IOHEXOL 300 MG/ML  SOLN COMPARISON:  December 04, 2012 FINDINGS: Lower chest: Lung bases are clear. Hepatobiliary: No focal liver lesions are appreciable on this noncontrast enhanced study. Gallbladder wall is not appreciably  thickened. No biliary duct dilatation. Pancreas: There is no pancreatic mass or inflammatory focus. Spleen: No splenic lesions are evident. Adrenals/Urinary Tract: Adrenals bilaterally appear normal. There are areas of decreased attenuation throughout portions of the left kidney with mixed attenuation throughout the left kidney. Left kidney appears subtly edematous. There is a somewhat complex area of ill-defined decreased attenuation along the upper pole left kidney measuring 2.8 x 2.1 cm. No evident mass involving the right kidney. There is subtle fullness of the left renal collecting system. No similar fullness on the right. No appreciable renal or ureteral calculus evident on either side. Note that the left ureter is slightly larger than the right ureter. Urinary bladder is midline with wall thickness within normal limits. Stomach/Bowel: No appreciable bowel wall or mesenteric thickening. No appreciable bowel obstruction. Terminal ileum appears normal. No periappendiceal region inflammation evident. No evident free air or portal venous air. Vascular/Lymphatic: No dominant aneurysm. No arterial vascular lesions are evident. Major venous structures appear patent. There is no appreciable adenopathy in the abdomen or pelvis. Reproductive: Uterus is antegrade. The left ovary is subtly edematous. Probable follicle within the left ovary measuring 2.21.8 cm. Right adnexal region appears normal by CT. Tubal ligation clip noted on the right. Second tubal ligation clip is in the lower pelvis and appears to have been displaced from the left fallopian tube. Other: No abscess or ascites evident in the abdomen or pelvis. There is slight fat in the umbilicus. Musculoskeletal: No blastic or lytic bone lesions. No abdominal wall or intramuscular lesions. IMPRESSION: 1. The left kidney is subtly edematous. Left kidney has an inhomogeneous appearance with areas of decreased attenuation in the upper pole region, not felt to  represent simple cyst. Slight hydronephrosis on  the left. This combination of findings is concerning for potential lobar nephronia/bacterial nephritis on the left. Appropriate laboratory correlation in this regard advised. 2. No renal or ureteral calculi on either side. Urinary bladder wall thickness normal. 3. Left ovary is larger than right ovary with left ovary appearing equivocally edematous. Advise pelvic ultrasound with Doppler assessment to further evaluate left ovary. No mass beyond apparent follicle in left ovary. 4. Left tubal ligation clip is displaced from the fallopian tube and located in the dependent portion of the pelvis in the midline. Right tubal ligation clip in fallopian tube region on the right. 5. No bowel wall thickening or bowel obstruction. No abscess in the abdomen pelvis. No periappendiceal region inflammation evident. Electronically Signed   By: Lowella Grip III M.D.   On: 05/04/2021 11:48   US PELVIC COMPLETE W TRANSVAGINAL AND TORSION R/O  Result Date: 05/04/2021 CLINICAL DATA:  Left lower quadrant pain. Abnormal appearance of left ovary on recent CT. Previous endometrial ablation. EXAM: TRANSABDOMINAL AND TRANSVAGINAL ULTRASOUND OF PELVIS DOPPLER ULTRASOUND OF OVARIES TECHNIQUE: Both transabdominal and transvaginal ultrasound examinations of the pelvis were performed. Transabdominal technique was performed for global imaging of the pelvis including uterus, ovaries, adnexal regions, and pelvic cul-de-sac. It was necessary to proceed with endovaginal exam following the transabdominal exam to visualize the endometrium and ovaries. Color and duplex Doppler ultrasound was utilized to evaluate blood flow to the ovaries. COMPARISON:  CT on 05/04/2021 FINDINGS: Uterus Measurements: 8.8 x 3.6 x 4.8 cm = volume: 80 mL. Heterogeneous appearance of myometrium is noted with indistinct endometrial-myometrial junction, which is attributable to prior endometrial ablation. No distinct  myometrial masses are identified. Endometrium Thickness: 8 mm. A small cystic focus is seen in the upper portion of the endometrial cavity, likely the sequela of previous endometrial ablation. Right ovary Measurements: 2.9 x 2.1 x 1.6 cm = volume: 6 mL. Normal appearance/no adnexal mass. Left ovary Measurements: 4.5 x 2.1 x 4.7 cm = volume: 23 mL. A heterogeneous hypoechoic lesion is seen in the left ovary, which measures 2.7 x 1.7 x 3.0 cm and shows no evidence of internal blood flow on color Doppler ultrasound. These features are suggestive of but not classic for a hemorrhagic ovarian cyst. Pulsed Doppler evaluation of both ovaries demonstrates normal low-resistance arterial and venous waveforms. Other findings No abnormal free fluid. IMPRESSION: 3 cm indeterminate but probably benign left ovarian lesion, which may represent a hemorrhagic ovarian cyst. Recommend follow-up by transvaginal pelvic ultrasound in 6-12 weeks to confirm resolution. This recommendation follows the consensus statement: Management of Asymptomatic Ovarian and Other Adnexal Cysts Imaged at [place]: Society of Radiologists in [place]. Radiology 2010; [phone]. No sonographic evidence for ovarian torsion. Findings consistent with previous endometrial ablation. Electronically Signed   By: Marlaine Hind M.D.   On: 05/04/2021 13:52    Procedures Procedures   Medications Ordered in ED Medications  acetaminophen (TYLENOL) tablet 650 mg (650 mg Oral Given 05/04/21 1030)  sodium chloride 0.9 % bolus 1,000 mL (0 mLs Intravenous Stopped 05/04/21 1159)  iohexol (OMNIPAQUE) 300 MG/ML solution 75 mL (75 mLs Intravenous Contrast Given 05/04/21 1134)  cefTRIAXone (ROCEPHIN) 1 g in sodium chloride 0.9 % 100 mL IVPB (0 g Intravenous Stopped 05/04/21 1352)  oxyCODONE-acetaminophen (PERCOCET/ROXICET) 5-325 MG per tablet 2 tablet (2 tablets Oral Given 05/04/21 1411)    ED Course  I have reviewed the triage vital signs and  the nursing notes.  Pertinent labs & imaging results that were available during  my care of the patient were reviewed by me and considered in my medical decision making (see chart for details).    MDM Rules/Calculators/A&P                          Patient is a 36 year old female who presents with ongoing fevers after a prolonged treatment for pyelonephritis.  She also has associate abdominal pain.  She met sepsis criteria on arrival with fever, tachycardia and elevated WBC count.  Her CT scan shows left nephronia/isolated bacterial nephritis.  She was started on IV Rocephin and IV fluids. I spoke with the hospitalist, Dr. Earnest Conroy he will admit the patient.  I also spoke with Dr. Junious Silk with urology who will consult on the patient although at this point he does not think that there is indication for drain.  He does feel that it would be beneficial for the medicine team to have ID involved.  CRITICAL CARE Performed by: Malvin Johns Total critical care time: 60 minutes Critical care time was exclusive of separately billable procedures and treating other patients. Critical care was necessary to treat or prevent imminent or life-threatening deterioration. Critical care was time spent personally by me on the following activities: development of treatment plan with patient and/or surrogate as well as nursing, discussions with consultants, evaluation of patient's response to treatment, examination of patient, obtaining history from patient or surrogate, ordering and performing treatments and interventions, ordering and review of laboratory studies, ordering and review of radiographic studies, pulse oximetry and re-evaluation of patient's condition.  Final Clinical Impression(s) / ED Diagnoses Final diagnoses:  Bacterial nephritis  Sepsis, due to unspecified organism, unspecified whether acute organ dysfunction present [facility])    Rx / DC Orders ED Discharge Orders     None        Malvin Johns, MD 05/04/21 1521

## 2021-05-04 NOTE — Progress Notes (Signed)
Pharmacy Antibiotic Note  Sarah Eaton is a 35 y.o. female admitted on 05/04/2021 with recurrent pyelonephritis with kidney abscess.  Pharmacy has been consulted for meropenem dosing.  Patient was recently at [facility] for severe pyelonephritis and was treated with zosyn and tobramycin, and discharged on Augmentin. Continued to have chills, fever and abdominal pain, so presented to the ED. I reviewed her EHR faxed by [facility], but there are currently no culture results available. Fevers to 103.4, WBC at 14.   Plan: Meropenem 1g q8hr -f/u plans to culture renal abscess -follow cultures and clinical symptoms     Temp (24hrs), Avg:99.4 F (37.4 C), Min:98 F (36.7 C), Max:102.4 F (39.1 C)  Recent Labs  Lab 05/04/21 0900 05/04/21 0901 05/04/21 1201  WBC 13.8*  --   --   CREATININE 0.72  --   --   LATICACIDVEN  --  1.0 0.7    CrCl cannot be calculated (Unknown ideal weight.).    Allergies  Allergen Reactions   Banana Anaphylaxis   [place] Gum [Fish Allergy] Anaphylaxis    Antimicrobials this admission: Meropenem 6/17 >>  Dose adjustments this admission: NA  Microbiology results: 6/17 BCx: pend 6/17 UCx: pend    Thank you for allowing pharmacy to be a part of this patient's care.  Lamar Sprinkles, PharmD PGY1 Pharmacy Resident 05/04/2021 8:01 PM

## 2021-05-04 NOTE — ED Notes (Signed)
Attempted to call report. Awaiting charge RN to assign pt to nurse.

## 2021-05-04 NOTE — ED Notes (Signed)
Pt given lunch, snacks, and refreshments.

## 2021-05-04 NOTE — ED Notes (Signed)
Bed reassigned by charge RN. Attempted to call report. RN unavailable. Will call back.

## 2021-05-04 NOTE — ED Notes (Signed)
Patient transported to CT 

## 2021-05-04 NOTE — Progress Notes (Signed)
Patient admitted to [facility]W from ED. Patient is alert and oriented x4. Vital signs are stable and she is on room air. Has no complaints of pain. Skin is intact, no signs of skin breakdown noted on exam. Patient belongings at bedside (clothing, cell phone). The patient was shown how to use the call bell. Call bell, phone and bedside table are within reach; bed is in the lowest position.

## 2021-05-04 NOTE — H&P (Signed)
History and Physical    DOA: 05/04/2021  PCP: [employer], new PCP recently set up-yet to be seen Patient coming from: home  Chief Complaint: fevers and flank pain  HPI: Sarah Eaton is a 35 y.o. female with history h/o migraines, GERD, ovarian cysts who was recently hospitalized at [facility] (while on a work trip) for sepsis, pyelonephritis related to somewhat resistant E. coli on urine cultures->presents now with persistent fevers up to 103F since discharged from hospital.  Reviewed paper records from outside hospital (requested discharge summary to be scanned) which indicate that patient was admitted there 6/4-6/13/2022 for sepsis (hypotensive on presentation to 78/38, tachycardia, leukocytosis 16 K) and treated with IV Zosyn/tobramycin.  I could not find urine culture report but per discharge summary was "E. coli with mixed resistance".  She was evaluated by urology and infectious diseases while there as CT abdomen/pelvis did indicate possibly small renal abscess->evaluated by IR in [place] and felt to be too small to drain.  Hospital course was complicated by cough and some dyspnea/transient hypoxia on 6/10 at [facility].  Patient was subsequently discharged on 6/13 with instructions to take oral Augmentin for another 10 days.  Patient states she did not have any afebrile days prior to discharge from there.  She continued to spike fevers up to 103F upon return to [facility] along with some left flank pain.  She continues to complain of persistent dry cough, without dyspnea since discharge.  She did not have any evidence of pneumonia but CT there did report small pleural effusions.  Patient also reports that she lost insurance for a period of time and had not been taking any of her regular home medications for at least a year.  She now has medical insurance through her job and has set up a new PCP. ED course today: WBC 13 K, normal BMP, normal UA.  CT abdomen/pelvis showed findings as below  suggesting possible small fluid collection/nephronia.  Requested EDP to consult urology->they recommended continuing IV antibiotics and will evaluate formally in AM.  Review of Systems: As per HPI, otherwise review of systems negative.    Past Medical History:  Diagnosis Date   Hypertension    Obesity   Migraine GERD H/o ovarian cyst-endometrial ablation many yrs back Herpes zoster  Past Surgical History:  Procedure Laterality Date   CESAREAN SECTION     CESAREAN SECTION WITH BILATERAL TUBAL LIGATION Bilateral 08/05/2013   Procedure: REPEAT CESAREAN SECTION WITH BILATERAL TUBAL LIGATION;  Surgeon: Allie Bossier, MD;  Location: [facility];  Service: Obstetrics;  Laterality: Bilateral;   TUBAL LIGATION      Social history:  reports that she has never smoked. She has never used smokeless tobacco. She reports current alcohol use. She reports that she does not use drugs.   Allergies  Allergen Reactions   Banana Anaphylaxis   [place] Gum [Fish Allergy] Anaphylaxis    Family History  Problem Relation Age of Onset   Cancer Other        breast   Hypertension Mother    Cancer Mother        skin   Thyroid disease Mother    Cancer Maternal Grandmother    Hypertension Maternal Grandmother    Thyroid disease Father    Thyroid disease Paternal Grandmother       Prior to Admission medications   Medication Sig Start Date End Date Taking? Authorizing Provider  acetaminophen (TYLENOL) 325 MG tablet Take 325 mg by mouth every 6 (six)  hours as needed for mild pain.   Yes [provider]  amoxicillin-clavulanate (AUGMENTIN) 500-125 MG tablet Take 500 mg by mouth every 12 (twelve) hours.   Yes [provider]  oxyCODONE (OXY IR/ROXICODONE) 5 MG immediate release tablet Take 5 mg by mouth every 4 (four) hours as needed for moderate pain.   Yes [provider]  cyanocobalamin (,VITAMIN B-12,) 1000 MCG/ML injection Inject 1 mL (1,000 mcg total) into the muscle every 30  (thirty) days. Patient not taking: No sig reported 05/26/19   Shambley, Melody N, CNM  pantoprazole (PROTONIX) 20 MG tablet Take 1 tablet (20 mg total) by mouth daily. Patient not taking: Reported on 05/04/2021 07/04/20 07/04/21  Jene Every, MD  phentermine (ADIPEX-P) 37.5 MG tablet Take 1 tablet (37.5 mg total) by mouth daily before breakfast. Alternate every other day with 2 tablets Patient not taking: Reported on 05/04/2021 04/05/20   Hildred Laser, MD  rizatriptan (MAXALT) 10 MG tablet Take 1 tablet (10 mg total) by mouth as needed for migraine. May repeat in 2 hours if needed Patient not taking: Reported on 05/04/2021 03/29/20   Hildred Laser, MD  topiramate (TOPAMAX) 100 MG tablet Take 1 tablet (100 mg total) by mouth 2 (two) times daily. Patient not taking: Reported on 05/04/2021 07/01/19   Shambley, Melody N, CNM  valACYclovir (VALTREX) 1000 MG tablet Take 1 tablet (1,000 mg total) by mouth 2 (two) times daily. Take for ten days. Patient not taking: Reported on 05/04/2021 03/08/20   Hildred Laser, MD  zolpidem (AMBIEN) 5 MG tablet Take 5 mg by mouth at bedtime as needed for sleep. Patient not taking: No sig reported    [provider]    Physical Exam: Vitals:   05/04/21 1630 05/04/21 1645 05/04/21 1700 05/04/21 1715  BP:  114/80 97/60 112/85  Pulse: 92 92 91 92  Resp: 17 15 (!) 23 15  Temp:      TempSrc:      SpO2: 98% 98% 98% 98%    Constitutional: NAD, calm, comfortable overall, dry cough during interview Eyes: PERRL, lids and conjunctivae normal ENMT: Mucous membranes are moist. Posterior pharynx clear of any exudate or lesions.Normal dentition.  Neck: normal, supple, no masses, no thyromegaly Respiratory: clear to auscultation bilaterally, no wheezing although noted to be coughing, no crackles. Normal respiratory effort. No accessory muscle use.  Cardiovascular: Regular rate and rhythm, no murmurs / rubs / gallops. No extremity edema. 2+ pedal pulses. No carotid bruits.   Abdomen: Mild left CVA tenderness, no masses palpated. No hepatosplenomegaly. Bowel sounds positive.  Musculoskeletal: no clubbing / cyanosis. No joint deformity upper and lower extremities. Good ROM, no contractures. Normal muscle tone.  Neurologic: CN 2-12 grossly intact. Sensation intact, DTR normal. Strength 5/5 in all 4.  Psychiatric: Normal judgment and insight. Alert and oriented x 3. Normal mood.  SKIN/catheters: no rashes, lesions, ulcers. No induration  Labs on Admission: I have personally reviewed following labs and imaging studies  CBC: Recent Labs  Lab 05/04/21 0900  WBC 13.8*  HGB 11.1*  HCT 34.5*  MCV 93.0  PLT 865*   Basic Metabolic Panel: Recent Labs  Lab 05/04/21 0900  NA 134*  K 3.8  CL 100  CO2 24  GLUCOSE 102*  BUN 10  CREATININE 0.72  CALCIUM 8.7*   GFR: CrCl cannot be calculated (Unknown ideal weight.). Recent Labs  Lab 05/04/21 0900 05/04/21 0901 05/04/21 1201  WBC 13.8*  --   --   LATICACIDVEN  --  1.0 0.7   Liver Function Tests: Recent Labs  Lab 05/04/21 0900  AST 27  ALT 62*  ALKPHOS 91  BILITOT 0.6  PROT 7.5  ALBUMIN 2.9*   No results for input(s): LIPASE, AMYLASE in the last 168 hours. No results for input(s): AMMONIA in the last 168 hours. Coagulation Profile: No results for input(s): INR, PROTIME in the last 168 hours. Cardiac Enzymes: No results for input(s): CKTOTAL, CKMB, CKMBINDEX, TROPONINI in the last 168 hours. BNP (last 3 results) No results for input(s): PROBNP in the last 8760 hours. HbA1C: No results for input(s): HGBA1C in the last 72 hours. CBG: No results for input(s): GLUCAP in the last 168 hours. Lipid Profile: No results for input(s): CHOL, HDL, LDLCALC, TRIG, CHOLHDL, LDLDIRECT in the last 72 hours. Thyroid Function Tests: No results for input(s): TSH, T4TOTAL, FREET4, T3FREE, THYROIDAB in the last 72 hours. Anemia Panel: No results for input(s): VITAMINB12, FOLATE, FERRITIN, TIBC, IRON, RETICCTPCT  in the last 72 hours. Urine analysis:    Component Value Date/Time   COLORURINE YELLOW 05/04/2021 0901   APPEARANCEUR CLEAR 05/04/2021 0901   APPEARANCEUR Clear 08/09/2014 1750   LABSPEC 1.025 05/04/2021 0901   LABSPEC 1.026 08/09/2014 1750   PHURINE 5.0 05/04/2021 0901   GLUCOSEU NEGATIVE 05/04/2021 0901   GLUCOSEU Negative 08/09/2014 1750   HGBUR NEGATIVE 05/04/2021 0901   BILIRUBINUR NEGATIVE 05/04/2021 0901   BILIRUBINUR neg 01/25/2020 1453   BILIRUBINUR Negative 08/09/2014 1750   KETONESUR NEGATIVE 05/04/2021 0901   PROTEINUR NEGATIVE 05/04/2021 0901   UROBILINOGEN 0.2 01/25/2020 1453   UROBILINOGEN 0.2 06/11/2014 1755   NITRITE NEGATIVE 05/04/2021 0901   LEUKOCYTESUR NEGATIVE 05/04/2021 0901   LEUKOCYTESUR Negative 08/09/2014 1750    Radiological Exams on Admission: Personally reviewed  DG Chest 2 View  Result Date: 05/04/2021 CLINICAL DATA:  Cough and fever EXAM: CHEST - 2 VIEW COMPARISON:  July 04, 2020 FINDINGS: Lungs are clear. Heart size and pulmonary vascularity are normal. No adenopathy. No bone lesions. IMPRESSION: Lungs clear.  Cardiac silhouette normal. Electronically Signed   By: Bretta Bang III M.D.   On: 05/04/2021 09:17   CT Abdomen Pelvis W Contrast  Result Date: 05/04/2021 CLINICAL DATA:  Abdominal pain and fever EXAM: CT ABDOMEN AND PELVIS WITH CONTRAST TECHNIQUE: Multidetector CT imaging of the abdomen and pelvis was performed using the standard protocol following bolus administration of intravenous contrast. CONTRAST:  75mL OMNIPAQUE IOHEXOL 300 MG/ML  SOLN COMPARISON:  December 04, 2012 FINDINGS: Lower chest: Lung bases are clear. Hepatobiliary: No focal liver lesions are appreciable on this noncontrast enhanced study. Gallbladder wall is not appreciably thickened. No biliary duct dilatation. Pancreas: There is no pancreatic mass or inflammatory focus. Spleen: No splenic lesions are evident. Adrenals/Urinary Tract: Adrenals bilaterally appear normal.  There are areas of decreased attenuation throughout portions of the left kidney with mixed attenuation throughout the left kidney. Left kidney appears subtly edematous. There is a somewhat complex area of ill-defined decreased attenuation along the upper pole left kidney measuring 2.8 x 2.1 cm. No evident mass involving the right kidney. There is subtle fullness of the left renal collecting system. No similar fullness on the right. No appreciable renal or ureteral calculus evident on either side. Note that the left ureter is slightly larger than the right ureter. Urinary bladder is midline with wall thickness within normal limits. Stomach/Bowel: No appreciable bowel wall or mesenteric thickening. No appreciable bowel obstruction. Terminal ileum appears normal. No periappendiceal region inflammation evident. No evident free air  or portal venous air. Vascular/Lymphatic: No dominant aneurysm. No arterial vascular lesions are evident. Major venous structures appear patent. There is no appreciable adenopathy in the abdomen or pelvis. Reproductive: Uterus is antegrade. The left ovary is subtly edematous. Probable follicle within the left ovary measuring 2.21.8 cm. Right adnexal region appears normal by CT. Tubal ligation clip noted on the right. Second tubal ligation clip is in the lower pelvis and appears to have been displaced from the left fallopian tube. Other: No abscess or ascites evident in the abdomen or pelvis. There is slight fat in the umbilicus. Musculoskeletal: No blastic or lytic bone lesions. No abdominal wall or intramuscular lesions. IMPRESSION: 1. The left kidney is subtly edematous. Left kidney has an inhomogeneous appearance with areas of decreased attenuation in the upper pole region, not felt to represent simple cyst. Slight hydronephrosis on the left. This combination of findings is concerning for potential lobar nephronia/bacterial nephritis on the left. Appropriate laboratory correlation in this  regard advised. 2. No renal or ureteral calculi on either side. Urinary bladder wall thickness normal. 3. Left ovary is larger than right ovary with left ovary appearing equivocally edematous. Advise pelvic ultrasound with Doppler assessment to further evaluate left ovary. No mass beyond apparent follicle in left ovary. 4. Left tubal ligation clip is displaced from the fallopian tube and located in the dependent portion of the pelvis in the midline. Right tubal ligation clip in fallopian tube region on the right. 5. No bowel wall thickening or bowel obstruction. No abscess in the abdomen pelvis. No periappendiceal region inflammation evident. Electronically Signed   By: Bretta Bang III M.D.   On: 05/04/2021 11:48   US PELVIC COMPLETE W TRANSVAGINAL AND TORSION R/O  Result Date: 05/04/2021 CLINICAL DATA:  Left lower quadrant pain. Abnormal appearance of left ovary on recent CT. Previous endometrial ablation. EXAM: TRANSABDOMINAL AND TRANSVAGINAL ULTRASOUND OF PELVIS DOPPLER ULTRASOUND OF OVARIES TECHNIQUE: Both transabdominal and transvaginal ultrasound examinations of the pelvis were performed. Transabdominal technique was performed for global imaging of the pelvis including uterus, ovaries, adnexal regions, and pelvic cul-de-sac. It was necessary to proceed with endovaginal exam following the transabdominal exam to visualize the endometrium and ovaries. Color and duplex Doppler ultrasound was utilized to evaluate blood flow to the ovaries. COMPARISON:  CT on 05/04/2021 FINDINGS: Uterus Measurements: 8.8 x 3.6 x 4.8 cm = volume: 80 mL. Heterogeneous appearance of myometrium is noted with indistinct endometrial-myometrial junction, which is attributable to prior endometrial ablation. No distinct myometrial masses are identified. Endometrium Thickness: 8 mm. A small cystic focus is seen in the upper portion of the endometrial cavity, likely the sequela of previous endometrial ablation. Right ovary  Measurements: 2.9 x 2.1 x 1.6 cm = volume: 6 mL. Normal appearance/no adnexal mass. Left ovary Measurements: 4.5 x 2.1 x 4.7 cm = volume: 23 mL. A heterogeneous hypoechoic lesion is seen in the left ovary, which measures 2.7 x 1.7 x 3.0 cm and shows no evidence of internal blood flow on color Doppler ultrasound. These features are suggestive of but not classic for a hemorrhagic ovarian cyst. Pulsed Doppler evaluation of both ovaries demonstrates normal low-resistance arterial and venous waveforms. Other findings No abnormal free fluid. IMPRESSION: 3 cm indeterminate but probably benign left ovarian lesion, which may represent a hemorrhagic ovarian cyst. Recommend follow-up by transvaginal pelvic ultrasound in 6-12 weeks to confirm resolution. This recommendation follows the consensus statement: Management of Asymptomatic Ovarian and Other Adnexal Cysts Imaged at [place]: Society of Radiologists in Ultrasound  Consensus Production designer, theatre/television/filmConference Statement. Radiology 2010; [phone]. No sonographic evidence for ovarian torsion. Findings consistent with previous endometrial ablation. Electronically Signed   By: Danae OrleansJohn A Stahl M.D.   On: 05/04/2021 13:52         Assessment and Plan:   Active Problems:   Pyelonephritis    1.Sepsis due to recurrent pyelonephritis: Patient with fever up to 102.3F while here and 103.7 at home.  WBC 13.8K, lactate 1.0, CT showing stable/evolving changes from recent pyelonephritis.  Patient was treated with IV Zosyn/tobramycin while at outside facility and discharged home on p.o. Augmentin but concern for persistent infection/fevers.  Since detailed urine culture report unavailable currently, will obtain repeat cultures (urine and blood) and treat for resistant E. coli infection with possibly IV meropenem and consult ID in AM.  Pharmacy consult requested.  Urology to evaluate in a.m. as CT finding still consistent with small focal infection-too small to drain likely.  2.  Persistent dry cough:  Patient been on broad-spectrum antibiotics for a period of time and chest x-ray/CT unremarkable for pneumonia/significant pleural effusions.  There was concern for aspiration from vomiting in last admission but currently not vomiting.  We will continue with symptomatic management for now and nebs for possible reactive airway disease   3.  History of migraines: No complaints of headache currently and patient states she has been off triptan/Topamax for more than a year.  4.  GERD: Will resume PPI for possible contribution to problem #2  5.  Ovarian cyst: Pelvic ultrasonogram today in ED showed findings as above.  Patient reports that she had a large ovarian cyst in the past for which she underwent surgery and endometrial ablation.  Likely physiological cyst on current imaging-follow-up in 6 to 12 weeks as advised by radiology.  DVT prophylaxis: SCD (GIVEN EPISATXIS TODAY) AND encourage anbulation  COVID screen:negative  Code Status:  Full code .Health care proxy would be Phillis Haggisdam Daniels (husband) [phone]  Patient/Family Communication: Discussed with patient and all questions answered to satisfaction.  Consults called: urology.  Consider formal ID evaluation in a.m. Admission status :I certify that at the point of admission it is my clinical judgment that the patient will require inpatient hospital care spanning beyond 2 midnights from the point of admission due to high intensity of service and high frequency of surveillance required.Inpatient status is judged to be reasonable and necessary in order to provide the required intensity of service to ensure the patient's safety. The patient's presenting symptoms, physical exam findings, and initial radiographic and laboratory data in the context of their chronic comorbidities is felt to place them at high risk for further clinical deterioration. The following factors support the patient status of inpatient :       Alessandra BevelsNeelima Stephanee Barcomb MD [facility] Pager in [place]  If 7PM-7AM, please contact night-coverage [email or web address]   05/04/2021, 5:27 PM

## 2021-05-05 ENCOUNTER — Inpatient Hospital Stay (HOSPITAL_COMMUNITY): Payer: 59

## 2021-05-05 ENCOUNTER — Encounter (HOSPITAL_COMMUNITY): Payer: Self-pay | Admitting: Internal Medicine

## 2021-05-05 DIAGNOSIS — N12 Tubulo-interstitial nephritis, not specified as acute or chronic: Secondary | ICD-10-CM

## 2021-05-05 LAB — CBC
HCT: 30.7 % — ABNORMAL LOW (ref 36.0–46.0)
Hemoglobin: 10.2 g/dL — ABNORMAL LOW (ref 12.0–15.0)
MCH: 30.2 pg (ref 26.0–34.0)
MCHC: 33.2 g/dL (ref 30.0–36.0)
MCV: 90.8 fL (ref 80.0–100.0)
Platelets: 745 10*3/uL — ABNORMAL HIGH (ref 150–400)
RBC: 3.38 MIL/uL — ABNORMAL LOW (ref 3.87–5.11)
RDW: 12.1 % (ref 11.5–15.5)
WBC: 7.8 10*3/uL (ref 4.0–10.5)
nRBC: 0 % (ref 0.0–0.2)

## 2021-05-05 LAB — COMPREHENSIVE METABOLIC PANEL
ALT: 53 U/L — ABNORMAL HIGH (ref 0–44)
AST: 21 U/L (ref 15–41)
Albumin: 2.4 g/dL — ABNORMAL LOW (ref 3.5–5.0)
Alkaline Phosphatase: 73 U/L (ref 38–126)
Anion gap: 7 (ref 5–15)
BUN: <5 mg/dL — ABNORMAL LOW (ref 6–20)
CO2: 27 mmol/L (ref 22–32)
Calcium: 8.8 mg/dL — ABNORMAL LOW (ref 8.9–10.3)
Chloride: 100 mmol/L (ref 98–111)
Creatinine, Ser: 0.69 mg/dL (ref 0.44–1.00)
GFR, Estimated: >60 mL/min (ref >60)
Glucose, Bld: 112 mg/dL — ABNORMAL HIGH (ref 70–99)
Potassium: 4.1 mmol/L (ref 3.5–5.1)
Sodium: 134 mmol/L — ABNORMAL LOW (ref 135–145)
Total Bilirubin: 0.5 mg/dL (ref 0.3–1.2)
Total Protein: 6.7 g/dL (ref 6.5–8.1)

## 2021-05-05 LAB — PROTIME-INR
INR: 1.2 (ref 0.8–1.2)
Prothrombin Time: 14.8 seconds (ref 11.4–15.2)

## 2021-05-05 LAB — HIV ANTIBODY (ROUTINE TESTING W REFLEX): HIV Screen 4th Generation wRfx: NONREACTIVE

## 2021-05-05 MED ORDER — PROMETHAZINE-CODEINE 6.25-10 MG/5ML PO SYRP
5.0000 mL | ORAL_SOLUTION | Freq: Four times a day (QID) | ORAL | Status: DC | PRN
  Administered 2021-05-05 – 2021-05-09 (×13): 5 mL via ORAL
  Filled 2021-05-05 (×13): qty 5

## 2021-05-05 MED ORDER — SODIUM CHLORIDE 0.9 % IV SOLN
2.0000 g | INTRAVENOUS | Status: DC
  Administered 2021-05-05 – 2021-05-07 (×3): 2 g via INTRAVENOUS
  Filled 2021-05-05 (×3): qty 20

## 2021-05-05 MED ORDER — OXYCODONE HCL 5 MG PO TABS
5.0000 mg | ORAL_TABLET | ORAL | Status: DC | PRN
  Administered 2021-05-05 – 2021-05-08 (×13): 10 mg via ORAL
  Administered 2021-05-08: 5 mg via ORAL
  Administered 2021-05-08 – 2021-05-09 (×4): 10 mg via ORAL
  Filled 2021-05-05 (×2): qty 2
  Filled 2021-05-05: qty 1
  Filled 2021-05-05 (×15): qty 2

## 2021-05-05 MED ORDER — ZOLPIDEM TARTRATE 5 MG PO TABS
5.0000 mg | ORAL_TABLET | Freq: Every evening | ORAL | Status: DC | PRN
  Administered 2021-05-05 – 2021-05-08 (×4): 5 mg via ORAL
  Filled 2021-05-05 (×4): qty 1

## 2021-05-05 NOTE — Consult Note (Addendum)
[facility]                                                                                        Patient Identification: Patient Name: Sarah MedinaJulie C Tsan MRN: [id number] Admit Date: 05/04/2021  8:51 AM Today's Date: 05/05/2021 Reason for consult: Pyelonephritis Requesting provider: Arva ChafeNicholas Wendling  Principal Problem:   Pyelonephritis Active Problems:   Sepsis due to Escherichia coli (E. coli) (HCC)   GERD (gastroesophageal reflux disease)   Migraine   Antibiotics: Meropenem 05/04/21-current   Lines/Tubes: PIV   Assessment Left-sided pyelonephritis  Urine Cx at OSH E coli ( Non ESBL) Susceptibilities confirmed with Microbiology at [facility] [place]. See below at " Microbiology"  Left Ovarian Cyst: Recommend follow-up by transvaginal pelvic ultrasound in 6-12 weeks to confirm resolution  Recommendations  Switch Meropenem to ceftriaxone.  Still nauseous and has vomited this morning, will continue IV antibiotics for now  Fu blood cultures  Urology following - plan to at US in 48 hrs if fever fails to improved on IV antibiotics Monitor CBC and CMP on IV antibiotics Following  Rest of the management as per the primary team. Please call with questions or concerns.  Thank you for the consult  Odette FractionSabina Haydan Mansouri, MD Infectious Disease Physician [facility]  [facility] [address] [place], [place] [address] Phone: [phone]  Fax: [phone]  __________________________________________________________________________________________________________ HPI and Hospital Course: 35 year old female with past medical history of migraine, GERD, ovarian cyst who presented to the ED on 6/17 with complaint of fever up to 103 F after being recently discharged from hospital [facility] for left-sided pyelonephritis.  Patient was admitted at  outside hospital 6/4-04/2012 for sepsis secondary to pyelonephritis.  She was treated with IV tobramycin/Zosyn.  She was evaluated by urology and infectious diseases.  CT abdomen pelvis showed possible small renal abscess, evaluated by IR and felt to be too small to drain.  Patient was subsequently discharged on 6/13 to take a course of Augmentin for 10 days she was taking Augmentin as instructed however continue to be febrile.  She also developed worsening left flank pain and hence came to the ED for evaluation.  He denies any prior history of ureteral stones/kidney stones, UTIs, or ureteral stent placement.   At ED, she was febrile with T-max 102.4.  leukocytosis up to 13.8.  Platelets 865 CT abdomen pelvis concerning for lobar nephronia/bacterial nephritis on the left Ultrasound kidneys no hydronephrosis, no discrete drainable left intraparenchymal or perinephric fluid collections localized  Evaluated by urology, no plans for intervention. IR Has been consulted for possible aspiration.   ROS: General- Fevers and chills + HEENT - Denies headache, blurry vision, neck pain, sinus pain Chest - Denies any chest pain, SOB or cough CVS- Denies any dizziness/lightheadedness, syncopal attacks, palpitations Abdomen- Denies abdominal pain, hematochezia and diarrhea, NAUSEA AND VOMITING + Neuro - Denies any weakness, numbness, tingling sensation Psych - Denies any changes in mood irritability or depressive symptoms GU- denies burning and hemtauria  Skin - denies any rashes/lesions MSK - denies any joint pain/swelling or restricted ROM   Past  Medical History:  Diagnosis Date   Hypertension    Obesity    Past Surgical History:  Procedure Laterality Date   CESAREAN SECTION     CESAREAN SECTION WITH BILATERAL TUBAL LIGATION Bilateral 08/05/2013   Procedure: REPEAT CESAREAN SECTION WITH BILATERAL TUBAL LIGATION;  Surgeon: Allie Bossier, MD;  Location: [facility];  Service: Obstetrics;  Laterality: Bilateral;    TUBAL LIGATION       Scheduled Meds:  pantoprazole  20 mg Oral Daily   Continuous Infusions:  lactated ringers     meropenem (MERREM) IV 1 g (05/05/21 0603)   PRN Meds:.acetaminophen **OR** acetaminophen, fentaNYL (SUBLIMAZE) injection, metoprolol tartrate, ondansetron **OR** ondansetron (ZOFRAN) IV, oxyCODONE, promethazine-codeine, zolpidem  Allergies  Allergen Reactions   Banana Anaphylaxis   [place] Gum [Fish Allergy] Anaphylaxis   Social History   Socioeconomic History   Marital status: Married    Spouse name: Not on file   Number of children: Not on file   Years of education: Not on file   Highest education level: Not on file  Occupational History   Not on file  Tobacco Use   Smoking status: Never   Smokeless tobacco: Never  Vaping Use   Vaping Use: Never used  Substance and Sexual Activity   Alcohol use: Yes    Comment: occasionally   Drug use: Never   Sexual activity: Yes    Birth control/protection: Surgical  Other Topics Concern   Not on file  Social History Narrative   Not on file   Social Determinants of Health   Financial Resource Strain: Not on file  Food Insecurity: Not on file  Transportation Needs: Not on file  Physical Activity: Not on file  Stress: Not on file  Social Connections: Not on file  Intimate Partner Violence: Not on file    Vitals BP 115/81 (BP Location: Left Arm)   Pulse 84   Temp 97.6 F (36.4 C)   Resp 19   SpO2 96%    Physical Exam Constitutional: Not in acute distress, sitting up in bed comfortably    Comments:   Cardiovascular:     Rate and Rhythm: Normal rate and regular rhythm.     Heart sounds: No murmur heard.   Pulmonary:     Effort: Pulmonary effort is normal.     Comments: Clear air entry bilaterally  Abdominal:     Palpations: Abdomen is soft.     Tenderness: Nontender and nondistended, minimal left flank tenderness  Musculoskeletal:        General: No swelling or tenderness.   Skin:     Comments: No lesions or rashes  Neurological:     General: No focal deficit present.   Psychiatric:        Mood and Affect: Mood normal.    Pertinent Microbiology [facility]  Urine cx 04/21/21 >100,000 E coli  R - Ciprofloxacin, Bactrim, Ampicillin, Gentamicin, Tobramycin  S - cefazolin, ceftriaxone, cefepime, pip/tazo, amoxicillin-clavulanate, Nitrofurantoin, imipenem  Results for orders placed or performed during the hospital encounter of 05/04/21  Resp Panel by RT-PCR (Flu A&B, Covid) Nasopharyngeal Swab     Status: None   Collection Time: 05/04/21 10:08 AM   Specimen: Nasopharyngeal Swab; Nasopharyngeal(NP) swabs in vial transport medium  Result Value Ref Range Status   SARS Coronavirus 2 by RT PCR NEGATIVE NEGATIVE Final    Comment: (NOTE) SARS-CoV-2 target nucleic acids are NOT DETECTED.  The SARS-CoV-2 RNA is generally detectable in upper respiratory specimens during the  acute phase of infection. The lowest concentration of SARS-CoV-2 viral copies this assay can detect is 138 copies/mL. A negative result does not preclude SARS-Cov-2 infection and should not be used as the sole basis for treatment or other patient management decisions. A negative result may occur with  improper specimen collection/handling, submission of specimen other than nasopharyngeal swab, presence of viral mutation(s) within the areas targeted by this assay, and inadequate number of viral copies(<138 copies/mL). A negative result must be combined with clinical observations, patient history, and epidemiological information. The expected result is Negative.  Fact Sheet for Patients:  BloggerCourse.com  Fact Sheet for Healthcare Providers:  SeriousBroker.it  This test is no t yet approved or cleared by the [place] FDA and  has been authorized for detection and/or diagnosis of SARS-CoV-2 by FDA under an Emergency Use Authorization  (EUA). This EUA will remain  in effect (meaning this test can be used) for the duration of the COVID-19 declaration under Section 564(b)(1) of the Act, 21 U.S.C.section 360bbb-3(b)(1), unless the authorization is terminated  or revoked sooner.       Influenza A by PCR NEGATIVE NEGATIVE Final   Influenza B by PCR NEGATIVE NEGATIVE Final    Comment: (NOTE) The Xpert Xpress SARS-CoV-2/FLU/RSV plus assay is intended as an aid in the diagnosis of influenza from Nasopharyngeal swab specimens and should not be used as a sole basis for treatment. Nasal washings and aspirates are unacceptable for Xpert Xpress SARS-CoV-2/FLU/RSV testing.  Fact Sheet for Patients: BloggerCourse.com  Fact Sheet for Healthcare Providers: SeriousBroker.it  This test is not yet approved or cleared by the [place] FDA and has been authorized for detection and/or diagnosis of SARS-CoV-2 by FDA under an Emergency Use Authorization (EUA). This EUA will remain in effect (meaning this test can be used) for the duration of the COVID-19 declaration under Section 564(b)(1) of the Act, 21 U.S.C. section 360bbb-3(b)(1), unless the authorization is terminated or revoked.  Performed at [facility], [address], [place], [place] [address]     Pertinent Lab seen by me: CBC Latest Ref Rng & Units 05/05/2021 05/04/2021 07/04/2020  WBC 4.0 - 10.5 K/uL 7.8 13.8(H) 12.7(H)  Hemoglobin 12.0 - 15.0 g/dL 10.2(L) 11.1(L) 12.9  Hematocrit 36.0 - 46.0 % 30.7(L) 34.5(L) 38.6  Platelets 150 - 400 K/uL 745(H) 865(H) 280   CMP Latest Ref Rng & Units 05/05/2021 05/04/2021 07/04/2020  Glucose 70 - 99 mg/dL 865(H) 846(N) 629(B)  BUN 6 - 20 mg/dL <2(W) 10 20  Creatinine 0.44 - 1.00 mg/dL 4.13 2.44 0.10  Sodium 135 - 145 mmol/L 134(L) 134(L) 137  Potassium 3.5 - 5.1 mmol/L 4.1 3.8 4.2  Chloride 98 - 111 mmol/L 100 100 105  CO2 22 - 32 mmol/L 27 24 26   Calcium 8.9 - 10.3  mg/dL ) 2.7(O) 9.0  Total Protein 6.5 - 8.1 g/dL 6.7 7.5 -  Total Bilirubin 0.3 - 1.2 mg/dL 0.5 0.6 -  Alkaline Phos 38 - 126 U/L 73 91 -  AST 15 - 41 U/L 21 27 -  ALT 0 - 44 U/L 53(H) 62(H) -     Pertinent Imagings/Other Imagings Plain films and CT images have been personally visualized and interpreted; radiology reports have been reviewed. Decision making incorporated into the Impression / Recommendations.  5.3(G renal 05/05/21 FINDINGS: Right Kidney:   Renal measurements: 11.7 x 4.6 x 6.8 = volume: 192 mL. Echogenicity within normal limits. No mass or hydronephrosis visualized.   Left Kidney:  Renal measurements: 12 x 6.5 x 5.2 = volume: 223 mL. Echogenicity within normal limits. No mass or hydronephrosis. No perinephric collection localized.   Bladder:   Incompletely distended   Other:   None.   IMPRESSION: 1. No hydronephrosis. 2. No discrete drainable left intraparenchymal or perinephric fluid collections localized.  US pelvis 05/04/21 IMPRESSION: 3 cm indeterminate but probably benign left ovarian lesion, which may represent a hemorrhagic ovarian cyst. Recommend follow-up by transvaginal pelvic ultrasound in 6-12 weeks to confirm resolution. This recommendation follows the consensus statement: Management of Asymptomatic Ovarian and Other Adnexal Cysts Imaged at [place]: Society of Radiologists in Ultrasound Consensus Conference Statement. Radiology 2010; [phone].   No sonographic evidence for ovarian torsion.   Findings consistent with previous endometrial ablation.  CT abdomen/pelvis 05/04/21 IMPRESSION: 1. The left kidney is subtly edematous. Left kidney has an inhomogeneous appearance with areas of decreased attenuation in the upper pole region, not felt to represent simple cyst. Slight hydronephrosis on the left. This combination of findings is concerning for potential lobar nephronia/bacterial nephritis on the left. Appropriate laboratory  correlation in this regard advised.   2. No renal or ureteral calculi on either side. Urinary bladder wall thickness normal.   3. Left ovary is larger than right ovary with left ovary appearing equivocally edematous. Advise pelvic ultrasound with Doppler assessment to further evaluate left ovary. No mass beyond apparent follicle in left ovary.   4. Left tubal ligation clip is displaced from the fallopian tube and located in the dependent portion of the pelvis in the midline. Right tubal ligation clip in fallopian tube region on the right.   5. No bowel wall thickening or bowel obstruction. No abscess in the abdomen pelvis. No periappendiceal region inflammation evident.    I have spent more than 70  minutes for this patient encounter including review of prior medical records with greater than 50% of time being face to face and coordination of their care.  Electronically signed by:   Odette Fraction, MD Infectious Disease Physician [facility] Pager: [phone]

## 2021-05-05 NOTE — Progress Notes (Signed)
PROGRESS NOTE    Sarah Eaton  JHE:[id number] DOB: 01-Jun-1986 DOA: 05/04/2021 PCP: Patient, No Pcp Per (Inactive)     Brief Narrative:  Sarah Eaton has a past medical history of migraines, reflux, and ovarian cysts.  Patient presented on 6/17 for fevers and flank pain.  She was treated in [place] inpatient with tobramycin and Zosyn for pyelonephritis.  She was discharged home on Augmentin.  She continued to have symptoms and presented here.  She was started on Merrem 1 g every 8 hours.  Urology saw the patient yesterday on the 17th and recommended ID consultation.   New events last 24 hours / Subjective: She had a fever of 101 Fahrenheit overnight, reports feeling about the same with pain and fevers.  Her white count dropped to 7.8.  She has a slight cough and wishes her pain control was better.  Assessment & Plan:   Principal Problem:   Pyelonephritis  Every 8 hours  Appreciate urology  ID consulted today  Fentanyl as needed  Oxycodone 5-10 mg every 4 hours as needed  Urine culture pending  Active Problems:   Sepsis due to Escherichia coli (E. coli) (HCC)  Lactated Ringer's 100 mL/hour  Other treatment as above Cultures pending    GERD (gastroesophageal reflux disease)  Protonix 20 mg daily    Migraine  Holding home Topamax and Maxalt   DVT prophylaxis: SCDs Code Status: Full Family Communication: Self Coming From: Home Disposition Plan: Home Barriers to Discharge: Clinical improvement  Consultants:  Urology ID  Procedures:  None  Antimicrobials:  Anti-infectives (From admission, onward)    Start     Dose/Rate Route Frequency Ordered Stop   05/04/21 2200  meropenem (MERREM) 1 g in sodium chloride 0.9 % 100 mL IVPB        1 g 200 mL/hr over 30 Minutes Intravenous Every 8 hours 05/04/21 1800     05/04/21 1800  piperacillin-tazobactam (ZOSYN) IVPB 3.375 g  Status:  Discontinued        3.375 g 12.5 mL/hr over 240 Minutes Intravenous Every 8 hours 05/04/21 1727  05/04/21 1742   05/04/21 1230  cefTRIAXone (ROCEPHIN) 1 g in sodium chloride 0.9 % 100 mL IVPB        1 g 200 mL/hr over 30 Minutes Intravenous  Once 05/04/21 1215 05/04/21 1352        Objective: Vitals:   05/05/21 0414 05/05/21 0458 05/05/21 1159 05/05/21 1200  BP: 99/65 119/63 (!) 146/132 115/81  Pulse: 99 92 90 84  Resp: 17 16  19   Temp: (!) 101 F (38.3 C) 99.4 F (37.4 C)  97.6 F (36.4 C)  TempSrc: Oral Oral    SpO2: 97% 98% 96% 96%    Intake/Output Summary (Last 24 hours) at 05/05/2021 1514 Last data filed at 05/05/2021 0900 Gross per 24 hour  Intake 240 ml  Output --  Net 240 ml   Examination:  General exam: Appears calm and comfortable  Respiratory system: Clear to auscultation. Respiratory effort normal. No respiratory distress. No conversational dyspnea.  Cardiovascular system: S1 & S2 heard, RRR. No murmurs. No pedal edema. Gastrointestinal system: Abdomen is nondistended, soft and nontender. Normal bowel sounds heard. MSK: She is very tender to palpation over the left CVA Central nervous system: Alert and oriented. No focal neurological deficits. Speech clear.  Extremities: Symmetric in appearance  Skin: No rashes, lesions or ulcers on exposed skin  Psychiatry: Judgement and insight appear normal. Mood & affect appropriate.   Data Reviewed:  I have personally reviewed following labs and imaging studies  CBC: Recent Labs  Lab 05/04/21 0900 05/05/21 0221  WBC 13.8* 7.8  HGB 11.1* 10.2*  HCT 34.5* 30.7*  MCV 93.0 90.8  PLT 865* 745*   Basic Metabolic Panel: Recent Labs  Lab 05/04/21 0900 05/05/21 0221  NA 134* 134*  K 3.8 4.1  CL 100 100  CO2 24 27  GLUCOSE 102* 112*  BUN 10 <5*  CREATININE 0.72 0.69  CALCIUM 8.7* 8.8*   GFR: CrCl cannot be calculated (Unknown ideal weight.). Liver Function Tests: Recent Labs  Lab 05/04/21 0900 05/05/21 0221  AST 27 21  ALT 62* 53*  ALKPHOS 91 73  BILITOT 0.6 0.5  PROT 7.5 6.7  ALBUMIN 2.9* 2.4*    No results for input(s): LIPASE, AMYLASE in the last 168 hours. No results for input(s): AMMONIA in the last 168 hours. Coagulation Profile: Recent Labs  Lab 05/05/21 0221  INR 1.2   Cardiac Enzymes: No results for input(s): CKTOTAL, CKMB, CKMBINDEX, TROPONINI in the last 168 hours. BNP (last 3 results) No results for input(s): PROBNP in the last 8760 hours. HbA1C: No results for input(s): HGBA1C in the last 72 hours. CBG: No results for input(s): GLUCAP in the last 168 hours. Lipid Profile: No results for input(s): CHOL, HDL, LDLCALC, TRIG, CHOLHDL, LDLDIRECT in the last 72 hours. Thyroid Function Tests: No results for input(s): TSH, T4TOTAL, FREET4, T3FREE, THYROIDAB in the last 72 hours. Anemia Panel: No results for input(s): VITAMINB12, FOLATE, FERRITIN, TIBC, IRON, RETICCTPCT in the last 72 hours. Sepsis Labs: Recent Labs  Lab 05/04/21 0901 05/04/21 1201  LATICACIDVEN 1.0 0.7    Recent Results (from the past 240 hour(s))  Resp Panel by RT-PCR (Flu A&B, Covid) Nasopharyngeal Swab     Status: None   Collection Time: 05/04/21 10:08 AM   Specimen: Nasopharyngeal Swab; Nasopharyngeal(NP) swabs in vial transport medium  Result Value Ref Range Status   SARS Coronavirus 2 by RT PCR NEGATIVE NEGATIVE Final    Comment: (NOTE) SARS-CoV-2 target nucleic acids are NOT DETECTED.  The SARS-CoV-2 RNA is generally detectable in upper respiratory specimens during the acute phase of infection. The lowest concentration of SARS-CoV-2 viral copies this assay can detect is 138 copies/mL. A negative result does not preclude SARS-Cov-2 infection and should not be used as the sole basis for treatment or other patient management decisions. A negative result may occur with  improper specimen collection/handling, submission of specimen other than nasopharyngeal swab, presence of viral mutation(s) within the areas targeted by this assay, and inadequate number of viral copies(<138  copies/mL). A negative result must be combined with clinical observations, patient history, and epidemiological information. The expected result is Negative.  Fact Sheet for Patients:  BloggerCourse.com  Fact Sheet for Healthcare Providers:  SeriousBroker.it  This test is no t yet approved or cleared by the [place] FDA and  has been authorized for detection and/or diagnosis of SARS-CoV-2 by FDA under an Emergency Use Authorization (EUA). This EUA will remain  in effect (meaning this test can be used) for the duration of the COVID-19 declaration under Section 564(b)(1) of the Act, 21 U.S.C.section 360bbb-3(b)(1), unless the authorization is terminated  or revoked sooner.       Influenza A by PCR NEGATIVE NEGATIVE Final   Influenza B by PCR NEGATIVE NEGATIVE Final    Comment: (NOTE) The Xpert Xpress SARS-CoV-2/FLU/RSV plus assay is intended as an aid in the diagnosis of influenza from Nasopharyngeal swab specimens and  should not be used as a sole basis for treatment. Nasal washings and aspirates are unacceptable for Xpert Xpress SARS-CoV-2/FLU/RSV testing.  Fact Sheet for Patients: BloggerCourse.com  Fact Sheet for Healthcare Providers: SeriousBroker.it  This test is not yet approved or cleared by the [place] FDA and has been authorized for detection and/or diagnosis of SARS-CoV-2 by FDA under an Emergency Use Authorization (EUA). This EUA will remain in effect (meaning this test can be used) for the duration of the COVID-19 declaration under Section 564(b)(1) of the Act, 21 U.S.C. section 360bbb-3(b)(1), unless the authorization is terminated or revoked.  Performed at [facility], [address] [place], [place] [address]       Radiology Studies: DG Chest 2 View  Result Date: 05/04/2021 CLINICAL DATA:  Cough and fever EXAM: CHEST - 2 VIEW COMPARISON:   July 04, 2020 FINDINGS: Lungs are clear. Heart size and pulmonary vascularity are normal. No adenopathy. No bone lesions. IMPRESSION: Lungs clear.  Cardiac silhouette normal. Electronically Signed   By: Bretta Bang III M.D.   On: 05/04/2021 09:17   CT Abdomen Pelvis W Contrast  Result Date: 05/04/2021 CLINICAL DATA:  Abdominal pain and fever EXAM: CT ABDOMEN AND PELVIS WITH CONTRAST TECHNIQUE: Multidetector CT imaging of the abdomen and pelvis was performed using the standard protocol following bolus administration of intravenous contrast. CONTRAST:  75mL OMNIPAQUE IOHEXOL 300 MG/ML  SOLN COMPARISON:  December 04, 2012 FINDINGS: Lower chest: Lung bases are clear. Hepatobiliary: No focal liver lesions are appreciable on this noncontrast enhanced study. Gallbladder wall is not appreciably thickened. No biliary duct dilatation. Pancreas: There is no pancreatic mass or inflammatory focus. Spleen: No splenic lesions are evident. Adrenals/Urinary Tract: Adrenals bilaterally appear normal. There are areas of decreased attenuation throughout portions of the left kidney with mixed attenuation throughout the left kidney. Left kidney appears subtly edematous. There is a somewhat complex area of ill-defined decreased attenuation along the upper pole left kidney measuring 2.8 x 2.1 cm. No evident mass involving the right kidney. There is subtle fullness of the left renal collecting system. No similar fullness on the right. No appreciable renal or ureteral calculus evident on either side. Note that the left ureter is slightly larger than the right ureter. Urinary bladder is midline with wall thickness within normal limits. Stomach/Bowel: No appreciable bowel wall or mesenteric thickening. No appreciable bowel obstruction. Terminal ileum appears normal. No periappendiceal region inflammation evident. No evident free air or portal venous air. Vascular/Lymphatic: No dominant aneurysm. No arterial vascular lesions are  evident. Major venous structures appear patent. There is no appreciable adenopathy in the abdomen or pelvis. Reproductive: Uterus is antegrade. The left ovary is subtly edematous. Probable follicle within the left ovary measuring 2.21.8 cm. Right adnexal region appears normal by CT. Tubal ligation clip noted on the right. Second tubal ligation clip is in the lower pelvis and appears to have been displaced from the left fallopian tube. Other: No abscess or ascites evident in the abdomen or pelvis. There is slight fat in the umbilicus. Musculoskeletal: No blastic or lytic bone lesions. No abdominal wall or intramuscular lesions. IMPRESSION: 1. The left kidney is subtly edematous. Left kidney has an inhomogeneous appearance with areas of decreased attenuation in the upper pole region, not felt to represent simple cyst. Slight hydronephrosis on the left. This combination of findings is concerning for potential lobar nephronia/bacterial nephritis on the left. Appropriate laboratory correlation in this regard advised. 2. No renal or ureteral calculi on either  side. Urinary bladder wall thickness normal. 3. Left ovary is larger than right ovary with left ovary appearing equivocally edematous. Advise pelvic ultrasound with Doppler assessment to further evaluate left ovary. No mass beyond apparent follicle in left ovary. 4. Left tubal ligation clip is displaced from the fallopian tube and located in the dependent portion of the pelvis in the midline. Right tubal ligation clip in fallopian tube region on the right. 5. No bowel wall thickening or bowel obstruction. No abscess in the abdomen pelvis. No periappendiceal region inflammation evident. Electronically Signed   By: Bretta Bang III M.D.   On: 05/04/2021 11:48   US RENAL  Result Date: 05/05/2021 CLINICAL DATA:  Pyelonephritis. Small intraparenchymal/perinephric collections suspected on CT. EXAM: RENAL / URINARY TRACT ULTRASOUND COMPLETE COMPARISON:  CT from  previous day FINDINGS: Right Kidney: Renal measurements: 11.7 x 4.6 x 6.8 = volume: 192 mL. Echogenicity within normal limits. No mass or hydronephrosis visualized. Left Kidney: Renal measurements: 12 x 6.5 x 5.2 = volume: 223 mL. Echogenicity within normal limits. No mass or hydronephrosis. No perinephric collection localized. Bladder: Incompletely distended Other: None. IMPRESSION: 1. No hydronephrosis. 2. No discrete drainable left intraparenchymal or perinephric fluid collections localized. Electronically Signed   By: Corlis Leak M.D.   On: 05/05/2021 13:02   US PELVIC COMPLETE W TRANSVAGINAL AND TORSION R/O  Result Date: 05/04/2021 CLINICAL DATA:  Left lower quadrant pain. Abnormal appearance of left ovary on recent CT. Previous endometrial ablation. EXAM: TRANSABDOMINAL AND TRANSVAGINAL ULTRASOUND OF PELVIS DOPPLER ULTRASOUND OF OVARIES TECHNIQUE: Both transabdominal and transvaginal ultrasound examinations of the pelvis were performed. Transabdominal technique was performed for global imaging of the pelvis including uterus, ovaries, adnexal regions, and pelvic cul-de-sac. It was necessary to proceed with endovaginal exam following the transabdominal exam to visualize the endometrium and ovaries. Color and duplex Doppler ultrasound was utilized to evaluate blood flow to the ovaries. COMPARISON:  CT on 05/04/2021 FINDINGS: Uterus Measurements: 8.8 x 3.6 x 4.8 cm = volume: 80 mL. Heterogeneous appearance of myometrium is noted with indistinct endometrial-myometrial junction, which is attributable to prior endometrial ablation. No distinct myometrial masses are identified. Endometrium Thickness: 8 mm. A small cystic focus is seen in the upper portion of the endometrial cavity, likely the sequela of previous endometrial ablation. Right ovary Measurements: 2.9 x 2.1 x 1.6 cm = volume: 6 mL. Normal appearance/no adnexal mass. Left ovary Measurements: 4.5 x 2.1 x 4.7 cm = volume: 23 mL. A heterogeneous hypoechoic  lesion is seen in the left ovary, which measures 2.7 x 1.7 x 3.0 cm and shows no evidence of internal blood flow on color Doppler ultrasound. These features are suggestive of but not classic for a hemorrhagic ovarian cyst. Pulsed Doppler evaluation of both ovaries demonstrates normal low-resistance arterial and venous waveforms. Other findings No abnormal free fluid. IMPRESSION: 3 cm indeterminate but probably benign left ovarian lesion, which may represent a hemorrhagic ovarian cyst. Recommend follow-up by transvaginal pelvic ultrasound in 6-12 weeks to confirm resolution. This recommendation follows the consensus statement: Management of Asymptomatic Ovarian and Other Adnexal Cysts Imaged at [place]: Society of Radiologists in Ultrasound Consensus Conference Statement. Radiology 2010; [phone]. No sonographic evidence for ovarian torsion. Findings consistent with previous endometrial ablation. Electronically Signed   By: Danae Orleans M.D.   On: 05/04/2021 13:52     Scheduled Meds:  pantoprazole  20 mg Oral Daily   Continuous Infusions:  lactated ringers     meropenem (MERREM) IV 1 g (05/05/21 1332)  LOS: 1 day    Time spent: 30 minutes   Sharlene DoryNicholas Paul Godfrey Tritschler, DO [facility] 05/05/2021, 3:14 PM   Available via Epic secure chat 7am-7pm After these hours, please refer to coverage provider listed on [email or web address]

## 2021-05-05 NOTE — Plan of Care (Signed)
Goal to be free of infection.

## 2021-05-05 NOTE — Progress Notes (Signed)
Subjective: Patient reports continue left flank pain which is alleviated with narcotics Fever 102.4 yesterday. Renal US shows no drainage fluid collection the in the left kidney.   Objective: Vital signs in last 24 hours: Temp:  [97.6 F (36.4 C)-101 F (38.3 C)] 99.6 F (37.6 C) (06/18 1955) Pulse Rate:  [84-107] 91 (06/18 1955) Resp:  [16-19] 16 (06/18 1955) BP: (99-146)/(63-132) 127/96 (06/18 1955) SpO2:  [95 %-98 %] 96 % (06/18 1955)  Intake/Output from previous day: No intake/output data recorded. Intake/Output this shift: No intake/output data recorded.  Physical Exam:  General:alert, cooperative, and appears stated age GI: not done and soft, non tender, normal bowel sounds, no palpable masses, no organomegaly, no inguinal hernia Female genitalia: not done Extremities: extremities normal, atraumatic, no cyanosis or edema  Lab Results: Recent Labs    05/04/21 0900 05/05/21 0221  HGB 11.1* 10.2*  HCT 34.5* 30.7*   BMET Recent Labs    05/04/21 0900 05/05/21 0221  NA 134* 134*  K 3.8 4.1  CL 100 100  CO2 24 27  GLUCOSE 102* 112*  BUN 10 <5*  CREATININE 0.72 0.69  CALCIUM 8.7* 8.8*   Recent Labs    05/05/21 0221  INR 1.2   No results for input(s): LABURIN in the last 72 hours. Results for orders placed or performed during the hospital encounter of 05/04/21  Resp Panel by RT-PCR (Flu A&B, Covid) Nasopharyngeal Swab     Status: None   Collection Time: 05/04/21 10:08 AM   Specimen: Nasopharyngeal Swab; Nasopharyngeal(NP) swabs in vial transport medium  Result Value Ref Range Status   SARS Coronavirus 2 by RT PCR NEGATIVE NEGATIVE Final    Comment: (NOTE) SARS-CoV-2 target nucleic acids are NOT DETECTED.  The SARS-CoV-2 RNA is generally detectable in upper respiratory specimens during the acute phase of infection. The lowest concentration of SARS-CoV-2 viral copies this assay can detect is 138 copies/mL. A negative result does not preclude  SARS-Cov-2 infection and should not be used as the sole basis for treatment or other patient management decisions. A negative result may occur with  improper specimen collection/handling, submission of specimen other than nasopharyngeal swab, presence of viral mutation(s) within the areas targeted by this assay, and inadequate number of viral copies(<138 copies/mL). A negative result must be combined with clinical observations, patient history, and epidemiological information. The expected result is Negative.  Fact Sheet for Patients:  BloggerCourse.com  Fact Sheet for Healthcare Providers:  SeriousBroker.it  This test is no t yet approved or cleared by the [place] FDA and  has been authorized for detection and/or diagnosis of SARS-CoV-2 by FDA under an Emergency Use Authorization (EUA). This EUA will remain  in effect (meaning this test can be used) for the duration of the COVID-19 declaration under Section 564(b)(1) of the Act, 21 U.S.C.section 360bbb-3(b)(1), unless the authorization is terminated  or revoked sooner.       Influenza A by PCR NEGATIVE NEGATIVE Final   Influenza B by PCR NEGATIVE NEGATIVE Final    Comment: (NOTE) The Xpert Xpress SARS-CoV-2/FLU/RSV plus assay is intended as an aid in the diagnosis of influenza from Nasopharyngeal swab specimens and should not be used as a sole basis for treatment. Nasal washings and aspirates are unacceptable for Xpert Xpress SARS-CoV-2/FLU/RSV testing.  Fact Sheet for Patients: BloggerCourse.com  Fact Sheet for Healthcare Providers: SeriousBroker.it  This test is not yet approved or cleared by the [place] FDA and has been authorized for detection and/or diagnosis of SARS-CoV-2  by FDA under an Emergency Use Authorization (EUA). This EUA will remain in effect (meaning this test can be used) for the duration of  the COVID-19 declaration under Section 564(b)(1) of the Act, 21 U.S.C. section 360bbb-3(b)(1), unless the authorization is terminated or revoked.  Performed at [facility], [address], [place], [place] [address]     Studies/Results: DG Chest 2 View  Result Date: 05/04/2021 CLINICAL DATA:  Cough and fever EXAM: CHEST - 2 VIEW COMPARISON:  July 04, 2020 FINDINGS: Lungs are clear. Heart size and pulmonary vascularity are normal. No adenopathy. No bone lesions. IMPRESSION: Lungs clear.  Cardiac silhouette normal. Electronically Signed   By: Bretta Bang III M.D.   On: 05/04/2021 09:17   CT Abdomen Pelvis W Contrast  Result Date: 05/04/2021 CLINICAL DATA:  Abdominal pain and fever EXAM: CT ABDOMEN AND PELVIS WITH CONTRAST TECHNIQUE: Multidetector CT imaging of the abdomen and pelvis was performed using the standard protocol following bolus administration of intravenous contrast. CONTRAST:  56mL OMNIPAQUE IOHEXOL 300 MG/ML  SOLN COMPARISON:  December 04, 2012 FINDINGS: Lower chest: Lung bases are clear. Hepatobiliary: No focal liver lesions are appreciable on this noncontrast enhanced study. Gallbladder wall is not appreciably thickened. No biliary duct dilatation. Pancreas: There is no pancreatic mass or inflammatory focus. Spleen: No splenic lesions are evident. Adrenals/Urinary Tract: Adrenals bilaterally appear normal. There are areas of decreased attenuation throughout portions of the left kidney with mixed attenuation throughout the left kidney. Left kidney appears subtly edematous. There is a somewhat complex area of ill-defined decreased attenuation along the upper pole left kidney measuring 2.8 x 2.1 cm. No evident mass involving the right kidney. There is subtle fullness of the left renal collecting system. No similar fullness on the right. No appreciable renal or ureteral calculus evident on either side. Note that the left ureter is slightly larger than the right ureter.  Urinary bladder is midline with wall thickness within normal limits. Stomach/Bowel: No appreciable bowel wall or mesenteric thickening. No appreciable bowel obstruction. Terminal ileum appears normal. No periappendiceal region inflammation evident. No evident free air or portal venous air. Vascular/Lymphatic: No dominant aneurysm. No arterial vascular lesions are evident. Major venous structures appear patent. There is no appreciable adenopathy in the abdomen or pelvis. Reproductive: Uterus is antegrade. The left ovary is subtly edematous. Probable follicle within the left ovary measuring 2.21.8 cm. Right adnexal region appears normal by CT. Tubal ligation clip noted on the right. Second tubal ligation clip is in the lower pelvis and appears to have been displaced from the left fallopian tube. Other: No abscess or ascites evident in the abdomen or pelvis. There is slight fat in the umbilicus. Musculoskeletal: No blastic or lytic bone lesions. No abdominal wall or intramuscular lesions. IMPRESSION: 1. The left kidney is subtly edematous. Left kidney has an inhomogeneous appearance with areas of decreased attenuation in the upper pole region, not felt to represent simple cyst. Slight hydronephrosis on the left. This combination of findings is concerning for potential lobar nephronia/bacterial nephritis on the left. Appropriate laboratory correlation in this regard advised. 2. No renal or ureteral calculi on either side. Urinary bladder wall thickness normal. 3. Left ovary is larger than right ovary with left ovary appearing equivocally edematous. Advise pelvic ultrasound with Doppler assessment to further evaluate left ovary. No mass beyond apparent follicle in left ovary. 4. Left tubal ligation clip is displaced from the fallopian tube and located in the dependent portion of the pelvis in the  midline. Right tubal ligation clip in fallopian tube region on the right. 5. No bowel wall thickening or bowel obstruction. No  abscess in the abdomen pelvis. No periappendiceal region inflammation evident. Electronically Signed   By: Bretta Bang III M.D.   On: 05/04/2021 11:48   US RENAL  Result Date: 05/05/2021 CLINICAL DATA:  Pyelonephritis. Small intraparenchymal/perinephric collections suspected on CT. EXAM: RENAL / URINARY TRACT ULTRASOUND COMPLETE COMPARISON:  CT from previous day FINDINGS: Right Kidney: Renal measurements: 11.7 x 4.6 x 6.8 = volume: 192 mL. Echogenicity within normal limits. No mass or hydronephrosis visualized. Left Kidney: Renal measurements: 12 x 6.5 x 5.2 = volume: 223 mL. Echogenicity within normal limits. No mass or hydronephrosis. No perinephric collection localized. Bladder: Incompletely distended Other: None. IMPRESSION: 1. No hydronephrosis. 2. No discrete drainable left intraparenchymal or perinephric fluid collections localized. Electronically Signed   By: Corlis Leak M.D.   On: 05/05/2021 13:02   US PELVIC COMPLETE W TRANSVAGINAL AND TORSION R/O  Result Date: 05/04/2021 CLINICAL DATA:  Left lower quadrant pain. Abnormal appearance of left ovary on recent CT. Previous endometrial ablation. EXAM: TRANSABDOMINAL AND TRANSVAGINAL ULTRASOUND OF PELVIS DOPPLER ULTRASOUND OF OVARIES TECHNIQUE: Both transabdominal and transvaginal ultrasound examinations of the pelvis were performed. Transabdominal technique was performed for global imaging of the pelvis including uterus, ovaries, adnexal regions, and pelvic cul-de-sac. It was necessary to proceed with endovaginal exam following the transabdominal exam to visualize the endometrium and ovaries. Color and duplex Doppler ultrasound was utilized to evaluate blood flow to the ovaries. COMPARISON:  CT on 05/04/2021 FINDINGS: Uterus Measurements: 8.8 x 3.6 x 4.8 cm = volume: 80 mL. Heterogeneous appearance of myometrium is noted with indistinct endometrial-myometrial junction, which is attributable to prior endometrial ablation. No distinct myometrial  masses are identified. Endometrium Thickness: 8 mm. A small cystic focus is seen in the upper portion of the endometrial cavity, likely the sequela of previous endometrial ablation. Right ovary Measurements: 2.9 x 2.1 x 1.6 cm = volume: 6 mL. Normal appearance/no adnexal mass. Left ovary Measurements: 4.5 x 2.1 x 4.7 cm = volume: 23 mL. A heterogeneous hypoechoic lesion is seen in the left ovary, which measures 2.7 x 1.7 x 3.0 cm and shows no evidence of internal blood flow on color Doppler ultrasound. These features are suggestive of but not classic for a hemorrhagic ovarian cyst. Pulsed Doppler evaluation of both ovaries demonstrates normal low-resistance arterial and venous waveforms. Other findings No abnormal free fluid. IMPRESSION: 3 cm indeterminate but probably benign left ovarian lesion, which may represent a hemorrhagic ovarian cyst. Recommend follow-up by transvaginal pelvic ultrasound in 6-12 weeks to confirm resolution. This recommendation follows the consensus statement: Management of Asymptomatic Ovarian and Other Adnexal Cysts Imaged at [place]: Society of Radiologists in Ultrasound Consensus Conference Statement. Radiology 2010; [phone]. No sonographic evidence for ovarian torsion. Findings consistent with previous endometrial ablation. Electronically Signed   By: Danae Orleans M.D.   On: 05/04/2021 13:52    Assessment/Plan: 34yo with left pyelonephritis  Left Pyelonephritis: No fluid collection present on renal US today. Please repeat renal US in 48 hours if patients fever curve fails to improve on antibiotics. Please continue broad spectrum antibiotics   LOS: 1 day   Wilkie Aye 05/05/2021, 8:45 PM

## 2021-05-06 LAB — CBC
HCT: 29.2 % — ABNORMAL LOW (ref 36.0–46.0)
Hemoglobin: 9.7 g/dL — ABNORMAL LOW (ref 12.0–15.0)
MCH: 30 pg (ref 26.0–34.0)
MCHC: 33.2 g/dL (ref 30.0–36.0)
MCV: 90.4 fL (ref 80.0–100.0)
Platelets: 696 10*3/uL — ABNORMAL HIGH (ref 150–400)
RBC: 3.23 MIL/uL — ABNORMAL LOW (ref 3.87–5.11)
RDW: 12 % (ref 11.5–15.5)
WBC: 7.2 10*3/uL (ref 4.0–10.5)
nRBC: 0 % (ref 0.0–0.2)

## 2021-05-06 LAB — BASIC METABOLIC PANEL
Anion gap: 7 (ref 5–15)
BUN: <5 mg/dL — ABNORMAL LOW (ref 6–20)
CO2: 30 mmol/L (ref 22–32)
Calcium: 8.6 mg/dL — ABNORMAL LOW (ref 8.9–10.3)
Chloride: 97 mmol/L — ABNORMAL LOW (ref 98–111)
Creatinine, Ser: 0.67 mg/dL (ref 0.44–1.00)
GFR, Estimated: >60 mL/min (ref >60)
Glucose, Bld: 89 mg/dL (ref 70–99)
Potassium: 3.7 mmol/L (ref 3.5–5.1)
Sodium: 134 mmol/L — ABNORMAL LOW (ref 135–145)

## 2021-05-06 MED ORDER — POLYETHYLENE GLYCOL 3350 17 G PO PACK
17.0000 g | PACK | Freq: Every day | ORAL | Status: DC
  Administered 2021-05-06 – 2021-05-09 (×4): 17 g via ORAL
  Filled 2021-05-06 (×4): qty 1

## 2021-05-06 MED ORDER — ENOXAPARIN SODIUM 40 MG/0.4ML IJ SOSY
40.0000 mg | PREFILLED_SYRINGE | INTRAMUSCULAR | Status: DC
  Administered 2021-05-06 – 2021-05-08 (×3): 40 mg via SUBCUTANEOUS
  Filled 2021-05-06 (×3): qty 0.4

## 2021-05-06 MED ORDER — DOCUSATE SODIUM 100 MG PO CAPS
100.0000 mg | ORAL_CAPSULE | Freq: Two times a day (BID) | ORAL | Status: DC | PRN
  Administered 2021-05-06 – 2021-05-08 (×3): 100 mg via ORAL
  Filled 2021-05-06 (×3): qty 1

## 2021-05-06 MED ORDER — SENNA 8.6 MG PO TABS
2.0000 | ORAL_TABLET | Freq: Every day | ORAL | Status: DC | PRN
  Administered 2021-05-06 – 2021-05-08 (×4): 17.2 mg via ORAL
  Filled 2021-05-06 (×4): qty 2

## 2021-05-06 NOTE — Progress Notes (Signed)
PROGRESS NOTE    Sarah Eaton  KXF:[id number] DOB: 08-24-1986 DOA: 05/04/2021 PCP: Patient, No Pcp Per (Inactive)     Brief Narrative:  [name] has a past medical history of migraines, reflux, and ovarian cysts.  Patient presented on 6/17 for fevers and flank pain.  She was treated in [place] inpatient with tobramycin and Zosyn for pyelonephritis.  She was discharged home on Augmentin.  She continued to have symptoms and presented here.  She was started on Merrem 1 g every 8 hours.  Urology saw the patient yesterday on the 17th and recommended ID consultation.   New events last 24 hours / Subjective: Infectious disease saw the patient yesterday and changed her from urine to Rocephin.  She feels about the same.  Slept better, pain is better controlled, having some nausea but that is also improved.  The medicine is helping.  She has not had a bowel movement as often as she would like.  She was afebrile overnight.  Leukocytosis has resolved.  Assessment & Plan:   Principal Problem:   Pyelonephritis  Merrem started, Rocephin 2 g daily started  We will start monitoring CMP in addition to CBC  Appreciate ID  Appreciate urology  Pain control with fentanyl and oxycodone as previous  Active Problems:   Sepsis due to Escherichia coli (E. coli) (HCC)   Resolved  She is eating and drinking normally, will stop lactated Ringer's    GERD (gastroesophageal reflux disease)   Protonix 20 mg daily    Migraine  Constipation  Senna as needed  Colace as needed  MiraLAX scheduled  DVT prophylaxis: SCDs  Code Status: Full Family Communication: Self Coming From: Home Disposition Plan: Home Barriers to Discharge: Clinical improvement  Consultants:  Urology Infectious disease   Antimicrobials:  Anti-infectives (From admission, onward)    Start     Dose/Rate Route Frequency Ordered Stop   05/05/21 2200  cefTRIAXone (ROCEPHIN) 2 g in sodium chloride 0.9 % 100 mL IVPB        2 g 200 mL/hr  over 30 Minutes Intravenous Every 24 hours 05/05/21 2109     05/04/21 2200  meropenem (MERREM) 1 g in sodium chloride 0.9 % 100 mL IVPB  Status:  Discontinued        1 g 200 mL/hr over 30 Minutes Intravenous Every 8 hours 05/04/21 1800 05/05/21 2109   05/04/21 1800  piperacillin-tazobactam (ZOSYN) IVPB 3.375 g  Status:  Discontinued        3.375 g 12.5 mL/hr over 240 Minutes Intravenous Every 8 hours 05/04/21 1727 05/04/21 1742   05/04/21 1230  cefTRIAXone (ROCEPHIN) 1 g in sodium chloride 0.9 % 100 mL IVPB        1 g 200 mL/hr over 30 Minutes Intravenous  Once 05/04/21 1215 05/04/21 1352        Objective: Vitals:   05/05/21 1200 05/05/21 1955 05/06/21 0629 05/06/21 0806  BP: 115/81 (!) 127/96 129/89 129/85  Pulse: 84 91 83 82  Resp: 19 16 16 19   Temp: 97.6 F (36.4 C) 99.6 F (37.6 C) 98.1 F (36.7 C) 98.2 F (36.8 C)  TempSrc:  Oral Oral Oral  SpO2: 96% 96% 98% 98%    Intake/Output Summary (Last 24 hours) at 05/06/2021 0944 Last data filed at 05/05/2021 1747 Gross per 24 hour  Intake 540 ml  Output --  Net 540 ml    Examination:  General exam: Appears calm and comfortable  Respiratory system: Clear to auscultation. Respiratory effort normal.  No respiratory distress. No conversational dyspnea.  Cardiovascular system: S1 & S2 heard, RRR. No murmurs. No pedal edema. Gastrointestinal system: Abdomen is nondistended, soft and nontender. Normal bowel sounds heard. Central nervous system: Alert and oriented. No focal neurological deficits. Speech clear.  MSK: Not much tenderness over the right CVA but she still is tender over the left CVA Psychiatry: Judgement and insight appear normal. Mood & affect appropriate.   Data Reviewed: I have personally reviewed following labs and imaging studies  CBC: Recent Labs  Lab 05/04/21 0900 05/05/21 0221 05/06/21 0108  WBC 13.8* 7.8 7.2  HGB 11.1* 10.2* 9.7*  HCT 34.5* 30.7* 29.2*  MCV 93.0 90.8 90.4  PLT 865* 745* 696*    Basic Metabolic Panel: Recent Labs  Lab 05/04/21 0900 05/05/21 0221 05/06/21 0108  NA 134* 134* 134*  K 3.8 4.1 3.7  CL 100 100 97*  CO2 24 27 30   GLUCOSE 102* 112* 89  BUN 10 <5* <5*  CREATININE 0.72 0.69 0.67  CALCIUM 8.7* 8.8* 8.6*    Liver Function Tests: Recent Labs  Lab 05/04/21 0900 05/05/21 0221  AST 27 21  ALT 62* 53*  ALKPHOS 91 73  BILITOT 0.6 0.5  PROT 7.5 6.7  ALBUMIN 2.9* 2.4*   Coagulation Profile: Recent Labs  Lab 05/05/21 0221  INR 1.2   Sepsis Labs: Recent Labs  Lab 05/04/21 0901 05/04/21 1201  LATICACIDVEN 1.0 0.7    Recent Results (from the past 240 hour(s))  Resp Panel by RT-PCR (Flu A&B, Covid) Nasopharyngeal Swab     Status: None   Collection Time: 05/04/21 10:08 AM   Specimen: Nasopharyngeal Swab; Nasopharyngeal(NP) swabs in vial transport medium  Result Value Ref Range Status   SARS Coronavirus 2 by RT PCR NEGATIVE NEGATIVE Final    Comment: (NOTE) SARS-CoV-2 target nucleic acids are NOT DETECTED.  The SARS-CoV-2 RNA is generally detectable in upper respiratory specimens during the acute phase of infection. The lowest concentration of SARS-CoV-2 viral copies this assay can detect is 138 copies/mL. A negative result does not preclude SARS-Cov-2 infection and should not be used as the sole basis for treatment or other patient management decisions. A negative result may occur with  improper specimen collection/handling, submission of specimen other than nasopharyngeal swab, presence of viral mutation(s) within the areas targeted by this assay, and inadequate number of viral copies(<138 copies/mL). A negative result must be combined with clinical observations, patient history, and epidemiological information. The expected result is Negative.  Fact Sheet for Patients:  05/06/21  Fact Sheet for Healthcare Providers:  BloggerCourse.com  This test is no t yet approved or  cleared by the [place] FDA and  has been authorized for detection and/or diagnosis of SARS-CoV-2 by FDA under an Emergency Use Authorization (EUA). This EUA will remain  in effect (meaning this test can be used) for the duration of the COVID-19 declaration under Section 564(b)(1) of the Act, 21 U.S.C.section 360bbb-3(b)(1), unless the authorization is terminated  or revoked sooner.       Influenza A by PCR NEGATIVE NEGATIVE Final   Influenza B by PCR NEGATIVE NEGATIVE Final    Comment: (NOTE) The Xpert Xpress SARS-CoV-2/FLU/RSV plus assay is intended as an aid in the diagnosis of influenza from Nasopharyngeal swab specimens and should not be used as a sole basis for treatment. Nasal washings and aspirates are unacceptable for Xpert Xpress SARS-CoV-2/FLU/RSV testing.  Fact Sheet for Patients: [place]  Fact Sheet for Healthcare Providers: BloggerCourse.com  This test is  not yet approved or cleared by the [place] FDA and has been authorized for detection and/or diagnosis of SARS-CoV-2 by FDA under an Emergency Use Authorization (EUA). This EUA will remain in effect (meaning this test can be used) for the duration of the COVID-19 declaration under Section 564(b)(1) of the Act, 21 U.S.C. section 360bbb-3(b)(1), unless the authorization is terminated or revoked.  Performed at [facility], [address] [place], [place] [id number]       Radiology Studies: CT Abdomen Pelvis W Contrast  Result Date: 05/04/2021 CLINICAL DATA:  Abdominal pain and fever EXAM: CT ABDOMEN AND PELVIS WITH CONTRAST TECHNIQUE: Multidetector CT imaging of the abdomen and pelvis was performed using the standard protocol following bolus administration of intravenous contrast. CONTRAST:  75mL OMNIPAQUE IOHEXOL 300 MG/ML  SOLN COMPARISON:  December 04, 2012 FINDINGS: Lower chest: Lung bases are clear. Hepatobiliary: No focal liver  lesions are appreciable on this noncontrast enhanced study. Gallbladder wall is not appreciably thickened. No biliary duct dilatation. Pancreas: There is no pancreatic mass or inflammatory focus. Spleen: No splenic lesions are evident. Adrenals/Urinary Tract: Adrenals bilaterally appear normal. There are areas of decreased attenuation throughout portions of the left kidney with mixed attenuation throughout the left kidney. Left kidney appears subtly edematous. There is a somewhat complex area of ill-defined decreased attenuation along the upper pole left kidney measuring 2.8 x 2.1 cm. No evident mass involving the right kidney. There is subtle fullness of the left renal collecting system. No similar fullness on the right. No appreciable renal or ureteral calculus evident on either side. Note that the left ureter is slightly larger than the right ureter. Urinary bladder is midline with wall thickness within normal limits. Stomach/Bowel: No appreciable bowel wall or mesenteric thickening. No appreciable bowel obstruction. Terminal ileum appears normal. No periappendiceal region inflammation evident. No evident free air or portal venous air. Vascular/Lymphatic: No dominant aneurysm. No arterial vascular lesions are evident. Major venous structures appear patent. There is no appreciable adenopathy in the abdomen or pelvis. Reproductive: Uterus is antegrade. The left ovary is subtly edematous. Probable follicle within the left ovary measuring 2.21.8 cm. Right adnexal region appears normal by CT. Tubal ligation clip noted on the right. Second tubal ligation clip is in the lower pelvis and appears to have been displaced from the left fallopian tube. Other: No abscess or ascites evident in the abdomen or pelvis. There is slight fat in the umbilicus. Musculoskeletal: No blastic or lytic bone lesions. No abdominal wall or intramuscular lesions. IMPRESSION: 1. The left kidney is subtly edematous. Left kidney has an  inhomogeneous appearance with areas of decreased attenuation in the upper pole region, not felt to represent simple cyst. Slight hydronephrosis on the left. This combination of findings is concerning for potential lobar nephronia/bacterial nephritis on the left. Appropriate laboratory correlation in this regard advised. 2. No renal or ureteral calculi on either side. Urinary bladder wall thickness normal. 3. Left ovary is larger than right ovary with left ovary appearing equivocally edematous. Advise pelvic ultrasound with Doppler assessment to further evaluate left ovary. No mass beyond apparent follicle in left ovary. 4. Left tubal ligation clip is displaced from the fallopian tube and located in the dependent portion of the pelvis in the midline. Right tubal ligation clip in fallopian tube region on the right. 5. No bowel wall thickening or bowel obstruction. No abscess in the abdomen pelvis. No periappendiceal region inflammation evident. Electronically Signed   By: Bretta BangWilliam  Woodruff III M.D.  On: 05/04/2021 11:48   US RENAL  Result Date: 05/05/2021 CLINICAL DATA:  Pyelonephritis. Small intraparenchymal/perinephric collections suspected on CT. EXAM: RENAL / URINARY TRACT ULTRASOUND COMPLETE COMPARISON:  CT from previous day FINDINGS: Right Kidney: Renal measurements: 11.7 x 4.6 x 6.8 = volume: 192 mL. Echogenicity within normal limits. No mass or hydronephrosis visualized. Left Kidney: Renal measurements: 12 x 6.5 x 5.2 = volume: 223 mL. Echogenicity within normal limits. No mass or hydronephrosis. No perinephric collection localized. Bladder: Incompletely distended Other: None. IMPRESSION: 1. No hydronephrosis. 2. No discrete drainable left intraparenchymal or perinephric fluid collections localized. Electronically Signed   By: Corlis Leak M.D.   On: 05/05/2021 13:02   US PELVIC COMPLETE W TRANSVAGINAL AND TORSION R/O  Result Date: 05/04/2021 CLINICAL DATA:  Left lower quadrant pain. Abnormal appearance  of left ovary on recent CT. Previous endometrial ablation. EXAM: TRANSABDOMINAL AND TRANSVAGINAL ULTRASOUND OF PELVIS DOPPLER ULTRASOUND OF OVARIES TECHNIQUE: Both transabdominal and transvaginal ultrasound examinations of the pelvis were performed. Transabdominal technique was performed for global imaging of the pelvis including uterus, ovaries, adnexal regions, and pelvic cul-de-sac. It was necessary to proceed with endovaginal exam following the transabdominal exam to visualize the endometrium and ovaries. Color and duplex Doppler ultrasound was utilized to evaluate blood flow to the ovaries. COMPARISON:  CT on 05/04/2021 FINDINGS: Uterus Measurements: 8.8 x 3.6 x 4.8 cm = volume: 80 mL. Heterogeneous appearance of myometrium is noted with indistinct endometrial-myometrial junction, which is attributable to prior endometrial ablation. No distinct myometrial masses are identified. Endometrium Thickness: 8 mm. A small cystic focus is seen in the upper portion of the endometrial cavity, likely the sequela of previous endometrial ablation. Right ovary Measurements: 2.9 x 2.1 x 1.6 cm = volume: 6 mL. Normal appearance/no adnexal mass. Left ovary Measurements: 4.5 x 2.1 x 4.7 cm = volume: 23 mL. A heterogeneous hypoechoic lesion is seen in the left ovary, which measures 2.7 x 1.7 x 3.0 cm and shows no evidence of internal blood flow on color Doppler ultrasound. These features are suggestive of but not classic for a hemorrhagic ovarian cyst. Pulsed Doppler evaluation of both ovaries demonstrates normal low-resistance arterial and venous waveforms. Other findings No abnormal free fluid. IMPRESSION: 3 cm indeterminate but probably benign left ovarian lesion, which may represent a hemorrhagic ovarian cyst. Recommend follow-up by transvaginal pelvic ultrasound in 6-12 weeks to confirm resolution. This recommendation follows the consensus statement: Management of Asymptomatic Ovarian and Other Adnexal Cysts Imaged at [place]:  Society of Radiologists in Ultrasound Consensus Conference Statement. Radiology 2010; [phone]. No sonographic evidence for ovarian torsion. Findings consistent with previous endometrial ablation. Electronically Signed   By: Danae Orleans M.D.   On: 05/04/2021 13:52     Scheduled Meds:  pantoprazole  20 mg Oral Daily   Continuous Infusions:  cefTRIAXone (ROCEPHIN)  IV 2 g (05/05/21 2148)   lactated ringers 100 mL/hr at 05/06/21 0852     LOS: 2 days    Time spent: 25 minutes   Sharlene Dory, DO [facility] 05/06/2021, 9:44 AM   Available via Epic secure chat 7am-7pm After these hours, please refer to coverage provider listed on [email or web address]

## 2021-05-06 NOTE — Progress Notes (Addendum)
Subjective: Patient reports she is feeling much better.   Objective: Vital signs in last 24 hours: Temp:  [98.1 F (36.7 C)-99.6 F (37.6 C)] 98.2 F (36.8 C) (06/19 0806) Pulse Rate:  [82-91] 82 (06/19 0806) Resp:  [16-19] 19 (06/19 0806) BP: (127-129)/(85-96) 129/85 (06/19 0806) SpO2:  [96 %-98 %] 98 % (06/19 0806)  Intake/Output from previous day: 06/18 0701 - 06/19 0700 In: 780 [P.O.:480; IV Piggyback:300] Out: -  Intake/Output this shift: No intake/output data recorded.  Physical Exam:  She looks well, talking and smiling with her daughter and sister.  Lab Results: Recent Labs    05/04/21 0900 05/05/21 0221 05/06/21 0108  HGB 11.1* 10.2* 9.7*  HCT 34.5* 30.7* 29.2*   BMET Recent Labs    05/05/21 0221 05/06/21 0108  NA 134* 134*  K 4.1 3.7  CL 100 97*  CO2 27 30  GLUCOSE 112* 89  BUN <5* <5*  CREATININE 0.69 0.67  CALCIUM 8.8* 8.6*   Recent Labs    05/05/21 0221  INR 1.2   No results for input(s): LABURIN in the last 72 hours. Results for orders placed or performed during the hospital encounter of 05/04/21  Resp Panel by RT-PCR (Flu A&B, Covid) Nasopharyngeal Swab     Status: None   Collection Time: 05/04/21 10:08 AM   Specimen: Nasopharyngeal Swab; Nasopharyngeal(NP) swabs in vial transport medium  Result Value Ref Range Status   SARS Coronavirus 2 by RT PCR NEGATIVE NEGATIVE Final    Comment: (NOTE) SARS-CoV-2 target nucleic acids are NOT DETECTED.  The SARS-CoV-2 RNA is generally detectable in upper respiratory specimens during the acute phase of infection. The lowest concentration of SARS-CoV-2 viral copies this assay can detect is 138 copies/mL. A negative result does not preclude SARS-Cov-2 infection and should not be used as the sole basis for treatment or other patient management decisions. A negative result may occur with  improper specimen collection/handling, submission of specimen other than nasopharyngeal swab, presence of viral  mutation(s) within the areas targeted by this assay, and inadequate number of viral copies(<138 copies/mL). A negative result must be combined with clinical observations, patient history, and epidemiological information. The expected result is Negative.  Fact Sheet for Patients:  BloggerCourse.com  Fact Sheet for Healthcare Providers:  [employer]ious[employer]ker.it  This test is no t yet approved or cleared by the [place] FDA and  has been authorized for detection and/or diagnosis of SARS-CoV-2 by FDA under an Emergency Use Authorization (EUA). This EUA will remain  in effect (meaning this test can be used) for the duration of the COVID-19 declaration under Section 564(b)(1) of the Act, 21 U.S.C.section 360bbb-3(b)(1), unless the authorization is terminated  or revoked sooner.       Influenza A by PCR NEGATIVE NEGATIVE Final   Influenza B by PCR NEGATIVE NEGATIVE Final    Comment: (NOTE) The Xpert Xpress SARS-CoV-2/FLU/RSV plus assay is intended as an aid in the diagnosis of influenza from Nasopharyngeal swab specimens and should not be used as a sole basis for treatment. Nasal washings and aspirates are unacceptable for Xpert Xpress SARS-CoV-2/FLU/RSV testing.  Fact Sheet for Patients: BloggerCourse.com  Fact Sheet for Healthcare Providers: SeriousBroker.it  This test is not yet approved or cleared by the [place] FDA and has been authorized for detection and/or diagnosis of SARS-CoV-2 by FDA under an Emergency Use Authorization (EUA). This EUA will remain in effect (meaning this test can be used) for the duration of the COVID-19 declaration under Section 564(b)(1) of  the Act, 21 U.S.C. section 360bbb-3(b)(1), unless the authorization is terminated or revoked.  Performed at [facility], [address] [place], [place] [address]     Studies/Results: US  RENAL  Result Date: 05/05/2021 CLINICAL DATA:  Pyelonephritis. Small intraparenchymal/perinephric collections suspected on CT. EXAM: RENAL / URINARY TRACT ULTRASOUND COMPLETE COMPARISON:  CT from previous day FINDINGS: Right Kidney: Renal measurements: 11.7 x 4.6 x 6.8 = volume: 192 mL. Echogenicity within normal limits. No mass or hydronephrosis visualized. Left Kidney: Renal measurements: 12 x 6.5 x 5.2 = volume: 223 mL. Echogenicity within normal limits. No mass or hydronephrosis. No perinephric collection localized. Bladder: Incompletely distended Other: None. IMPRESSION: 1. No hydronephrosis. 2. No discrete drainable left intraparenchymal or perinephric fluid collections localized. Electronically Signed   By: Corlis Leak M.D.   On: 05/05/2021 13:02   I reviewed the ultrasound images  Assessment/Plan: Left pyelonephritis -  She has improved with IV abx. No drainable fluid collection on CT or [place].  Afebrile and normalized white count.  I will sign off.  Depending on her clinical course repeat ultrasound this admission might be advisable, but she has done quite well.  In the long run a follow-up ultrasound is a good idea and I left follow-up on the chart and told her to call the office for appointment. She can f/u at [facility] and I sent a message for f/u as well.    LOS: 2 days   Jerilee Field 05/06/2021, 1:36 PM

## 2021-05-07 ENCOUNTER — Other Ambulatory Visit: Payer: Self-pay

## 2021-05-07 DIAGNOSIS — R7881 Bacteremia: Secondary | ICD-10-CM

## 2021-05-07 LAB — CBC
HCT: 30.5 % — ABNORMAL LOW (ref 36.0–46.0)
Hemoglobin: 10.1 g/dL — ABNORMAL LOW (ref 12.0–15.0)
MCH: 29.9 pg (ref 26.0–34.0)
MCHC: 33.1 g/dL (ref 30.0–36.0)
MCV: 90.2 fL (ref 80.0–100.0)
Platelets: 653 10*3/uL — ABNORMAL HIGH (ref 150–400)
RBC: 3.38 MIL/uL — ABNORMAL LOW (ref 3.87–5.11)
RDW: 11.9 % (ref 11.5–15.5)
WBC: 7.8 10*3/uL (ref 4.0–10.5)
nRBC: 0 % (ref 0.0–0.2)

## 2021-05-07 LAB — COMPREHENSIVE METABOLIC PANEL
ALT: 45 U/L — ABNORMAL HIGH (ref 0–44)
AST: 21 U/L (ref 15–41)
Albumin: 2.4 g/dL — ABNORMAL LOW (ref 3.5–5.0)
Alkaline Phosphatase: 67 U/L (ref 38–126)
Anion gap: 9 (ref 5–15)
BUN: 10 mg/dL (ref 6–20)
CO2: 30 mmol/L (ref 22–32)
Calcium: 8.8 mg/dL — ABNORMAL LOW (ref 8.9–10.3)
Chloride: 97 mmol/L — ABNORMAL LOW (ref 98–111)
Creatinine, Ser: 0.8 mg/dL (ref 0.44–1.00)
GFR, Estimated: >60 mL/min (ref >60)
Glucose, Bld: 107 mg/dL — ABNORMAL HIGH (ref 70–99)
Potassium: 4.3 mmol/L (ref 3.5–5.1)
Sodium: 136 mmol/L (ref 135–145)
Total Bilirubin: 0.5 mg/dL (ref 0.3–1.2)
Total Protein: 6.1 g/dL — ABNORMAL LOW (ref 6.5–8.1)

## 2021-05-07 MED ORDER — FLUCONAZOLE 150 MG PO TABS
150.0000 mg | ORAL_TABLET | Freq: Once | ORAL | Status: AC
  Administered 2021-05-07: 150 mg via ORAL
  Filled 2021-05-07: qty 1

## 2021-05-07 NOTE — Progress Notes (Addendum)
[facility]  Date of Admission:  05/04/2021     CC: Bacteremia pyelonephritis  Lines: Peripheral iv's  Abx: 6/18-c ceftriaxone  6/20 once fluconazole   ASSESSMENT: Strep intermedius bacteremia Pyelonephritis  Patient clinically much improved Strep intermedius bacteremia (unclear source), called for endocarditis w/u and at least 2 weeks iv or high bioavailable abx therapy if no endocarditis/deep seated infection Ct abd/pelv no obvious abscess. Renal u/s no renal abscess  Duration of pyelo treatment would be 10 day  Ecoli in urine is non-esbl but resistant to bactrim/levoflox  PLAN: Ordered tte and tee Continue ceftriaxone for now Final abx plan pending tee, which is likely to be done on Wednesday   I spent more than 35 minute reviewing data/chart, and coordinating care and >50% direct face to face time providing counseling/discussing diagnostics/treatment plan with patient     Principal Problem:   Pyelonephritis Active Problems:   Sepsis due to Escherichia coli (E. coli) (HCC)   GERD (gastroesophageal reflux disease)   Migraine   Allergies  Allergen Reactions   Banana Anaphylaxis   [place] Gum [Fish Allergy] Anaphylaxis    Scheduled Meds:  enoxaparin (LOVENOX) injection  40 mg Subcutaneous Q24H   fluconazole  150 mg Oral Once   pantoprazole  20 mg Oral Daily   polyethylene glycol  17 g Oral Daily   Continuous Infusions:  cefTRIAXone (ROCEPHIN)  IV Stopped (05/06/21 2230)   PRN Meds:.acetaminophen **OR** acetaminophen, docusate sodium, fentaNYL (SUBLIMAZE) injection, metoprolol tartrate, ondansetron **OR** ondansetron (ZOFRAN) IV, oxyCODONE, promethazine-codeine, senna, zolpidem   SUBJECTIVE: Doing better No n/v/diarrhea No further flank pain No abd pain, headache, back pain  Review of Systems: ROS All other ROS was negative, except mentioned above     OBJECTIVE: Vitals:   05/07/21 0020 05/07/21 0440  05/07/21 0749 05/07/21 1230  BP: 120/82 120/84 102/77 109/81  Pulse: 80 75 76 79  Resp: 18 16 19 16   Temp: 98.7 F (37.1 C) 98.7 F (37.1 C) 98.9 F (37.2 C) 97.6 F (36.4 C)  TempSrc: Oral Oral Oral Oral  SpO2: 98% 98% 97% 100%   There is no height or weight on file to calculate BMI.  Physical Exam General/constitutional: no distress, pleasant HEENT: Normocephalic, PER, Conj Clear, EOMI, Oropharynx clear Neck supple CV: rrr no mrg Lungs: clear to auscultation, normal respiratory effort Abd: Soft, Nontender Ext: no edema Skin: No Rash Neuro: nonfocal MSK: no peripheral joint swelling/tenderness/warmth; back spines nontender    Lab Results Lab Results  Component Value Date   WBC 7.8 05/07/2021   HGB 10.1 (L) 05/07/2021   HCT 30.5 (L) 05/07/2021   MCV 90.2 05/07/2021   PLT 653 (H) 05/07/2021    Lab Results  Component Value Date   CREATININE 0.80 05/07/2021   BUN 10 05/07/2021   NA 136 05/07/2021   K 4.3 05/07/2021   CL 97 (L) 05/07/2021   CO2 30 05/07/2021    Lab Results  Component Value Date   ALT 45 (H) 05/07/2021   AST 21 05/07/2021   ALKPHOS 67 05/07/2021   BILITOT 0.5 05/07/2021      Microbiology: Recent Results (from the past 240 hour(s))  Resp Panel by RT-PCR (Flu A&B, Covid) Nasopharyngeal Swab     Status: None   Collection Time: 05/04/21 10:08 AM   Specimen: Nasopharyngeal Swab; Nasopharyngeal(NP) swabs in vial transport medium  Result Value Ref Range Status   SARS Coronavirus 2 by RT PCR NEGATIVE NEGATIVE Final  Comment: (NOTE) SARS-CoV-2 target nucleic acids are NOT DETECTED.  The SARS-CoV-2 RNA is generally detectable in upper respiratory specimens during the acute phase of infection. The lowest concentration of SARS-CoV-2 viral copies this assay can detect is 138 copies/mL. A negative result does not preclude SARS-Cov-2 infection and should not be used as the sole basis for treatment or other patient management decisions. A negative  result may occur with  improper specimen collection/handling, submission of specimen other than nasopharyngeal swab, presence of viral mutation(s) within the areas targeted by this assay, and inadequate number of viral copies(<138 copies/mL). A negative result must be combined with clinical observations, patient history, and epidemiological information. The expected result is Negative.  Fact Sheet for Patients:  BloggerCourse.com  Fact Sheet for Healthcare Providers:  SeriousBroker.it  This test is no t yet approved or cleared by the [place] FDA and  has been authorized for detection and/or diagnosis of SARS-CoV-2 by FDA under an Emergency Use Authorization (EUA). This EUA will remain  in effect (meaning this test can be used) for the duration of the COVID-19 declaration under Section 564(b)(1) of the Act, 21 U.S.C.section 360bbb-3(b)(1), unless the authorization is terminated  or revoked sooner.       Influenza A by PCR NEGATIVE NEGATIVE Final   Influenza B by PCR NEGATIVE NEGATIVE Final    Comment: (NOTE) The Xpert Xpress SARS-CoV-2/FLU/RSV plus assay is intended as an aid in the diagnosis of influenza from Nasopharyngeal swab specimens and should not be used as a sole basis for treatment. Nasal washings and aspirates are unacceptable for Xpert Xpress SARS-CoV-2/FLU/RSV testing.  Fact Sheet for Patients: BloggerCourse.com  Fact Sheet for Healthcare Providers: SeriousBroker.it  This test is not yet approved or cleared by the [place] FDA and has been authorized for detection and/or diagnosis of SARS-CoV-2 by FDA under an Emergency Use Authorization (EUA). This EUA will remain in effect (meaning this test can be used) for the duration of the COVID-19 declaration under Section 564(b)(1) of the Act, 21 U.S.C. section 360bbb-3(b)(1), unless the authorization is  terminated or revoked.  Performed at [facility], [address], [place], [place] [address]      Serology:   Imaging: If present, new imagings (plain films, ct scans, and mri) have been personally visualized and interpreted; radiology reports have been reviewed. Decision making incorporated into the Impression / Recommendations.  6/17 ct abd pelv 1. The left kidney is subtly edematous. Left kidney has an inhomogeneous appearance with areas of decreased attenuation in the upper pole region, not felt to represent simple cyst. Slight hydronephrosis on the left. This combination of findings is concerning for potential lobar nephronia/bacterial nephritis on the left. Appropriate laboratory correlation in this regard advised.   2. No renal or ureteral calculi on either side. Urinary bladder wall thickness normal.   3. Left ovary is larger than right ovary with left ovary appearing equivocally edematous. Advise pelvic ultrasound with Doppler assessment to further evaluate left ovary. No mass beyond apparent follicle in left ovary.   4. Left tubal ligation clip is displaced from the fallopian tube and located in the dependent portion of the pelvis in the midline. Right tubal ligation clip in fallopian tube region on the right.   5. No bowel wall thickening or bowel obstruction. No abscess in the abdomen pelvis. No periappendiceal region inflammation evident.  6/18 renal u/s 1. No hydronephrosis. 2. No discrete drainable left intraparenchymal or perinephric fluid collections localized.  Raymondo Band, MD  [facility] [phone] pager    05/07/2021, 2:32 PM

## 2021-05-07 NOTE — Progress Notes (Signed)
PROGRESS NOTE    CHESTINE Eaton  KGM:[id number] DOB: 1986/09/29 DOA: 05/04/2021 PCP: Patient, No Pcp Per (Inactive)    Brief Narrative:  Sarah Eaton is a 35 year old female with past medical history significant for migraine headache, GERD, ovarian cyst who presented to [facility], ED on 6/17 with fevers and flank pain.  Patient recently hospitalized in [place] and discharged on 613 for pyelonephritis/E. coli UTI to complete antibiotics with Augmentin, but despite treatment patient continued to spike fevers up to 103.0 F.  Patient reports dry cough without dyspnea.  Patient was recently hospitalized at [facility] for sepsis, pyelonephritis secondary to resistant E. coli.  Patient was admitted from 04/21/2021 through 04/30/2021 and treated with IV Zosyn/tobramycin.  Patient was seen by ID and urology during hospitalization with CT abdomen/pelvis indicating small renal abscesses, seen by IR and felt to be too small to drain.  Patient was discharged on 04/30/2021 with instructions to take oral Augmentin for another 10 days.  In the ED, temperature 102.4 F.  WBC 13 K, CT abdomen/pelvis with small fluid collection/nephronia.  EDP consulted urology.  Patient was started on IV antibiotics.  Duration consulted for further evaluation and management.   Assessment & Plan:   Principal Problem:   Pyelonephritis Active Problems:   Sepsis due to Escherichia coli (E. coli) (HCC)   GERD (gastroesophageal reflux disease)   Migraine   Bacteremia   Pyelonephritis Resistant E. coli UTI Strep intermedius bacteremia Patient presenting to the ED with recurrent fever despite recent prolonged hospitalization with IV antibiotics followed by oral Augmentin.  Patient was noted to have an elevated WBC count of 13 point 8K with temperature 102.4.  Chest x-ray with no acute cardiopulmonary disease process.  CT abdomen/pelvis with contrast with slightly edematous left kidney, slight hydronephrosis on  the left concerning for lobar nephronia/bacterial nephritis. --Infectious disease following, appreciate assistance --WBC 13.8>>7.8 --Blood cultures x2 6/17: Pending --TTE: Pending --TEE: Pending for 05/08/2021, n.p.o. after midnight --Ceftriaxone 2 g IV every 24 hours --Oxycodone 5-10 mg p.o. every 4 hours as needed moderate pain --Fentanyl 12.5-50 mcg IV every 2 hours as needed severe pain   History of migraines No complaints of headache currently.  Patient has been off of triptan/Topamax for more than 1 year.  Outpatient follow-up with PCP.  GERD: Continue PPI  Ovarian cyst CT abdomen/pelvis with left ovary slightly larger than right with left air ovary appearing equivocally edematous.  Pelvic ultrasound with this 3 cm indeterminate/benign left ovarian lesion.  Recommend outpatient follow-up ultrasound in 6-12 weeks.  Outpatient follow-up with GYN  Constipation --Senna daily prn --Colace 100mg  PO BID prn --MiraLAX PO daily   DVT prophylaxis: enoxaparin (LOVENOX) injection 40 mg Start: 05/06/21 1515 SCDs Start: 05/04/21 1723   Code Status: Full Code Family Communication: None present at bedside this morning  Disposition Plan:  Level of care: Med-Surg Status is: Inpatient  Remains inpatient appropriate because:Ongoing diagnostic testing needed not appropriate for outpatient work up, Unsafe d/c plan, IV treatments appropriate due to intensity of illness or inability to take PO, and Inpatient level of care appropriate due to severity of illness  Dispo: The patient is from: Home              Anticipated d/c is to: Home              Patient currently is not medically stable to d/c.   Difficult to place patient No   Consultants:  Infectious disease Urology  Procedures:  TTE:  Pending TEE: Pending  Antimicrobials:  Ceftriaxone 6/18>> Zosyn 6/17-6/17 Meropenem 6/17-6/18 Fluconazole 6/20 x 1    Subjective: Patient resting comfortably in bed.  Continues with mild left  flank pain, improved.  Afebrile past 24 hours.  Pending TTE/TEE.  No other questions or concerns at this time.  Denies headache, no dizziness, no chest pain, no palpitations, no fever/chills/night sweats, no nausea/vomiting/diarrhea, no abdominal pain, no weakness, no fatigue, no paresthesias.  No acute events overnight per nurse staff.   Objective: Vitals:   05/07/21 0440 05/07/21 0749 05/07/21 1230 05/07/21 1721  BP: 120/84 102/77 109/81 121/87  Pulse: 75 76 79 78  Resp: 16 19 16 16   Temp: 98.7 F (37.1 C) 98.9 F (37.2 C) 97.6 F (36.4 C) 98.4 F (36.9 C)  TempSrc: Oral Oral Oral Oral  SpO2: 98% 97% 100% 99%    Intake/Output Summary (Last 24 hours) at 05/07/2021 1746 Last data filed at 05/07/2021 1721 Gross per 24 hour  Intake 360 ml  Output --  Net 360 ml   There were no vitals filed for this visit.  Examination:  General exam: Appears calm and comfortable  Respiratory system: Clear to auscultation. Respiratory effort normal. Cardiovascular system: S1 & S2 heard, RRR. No JVD, murmurs, rubs, gallops or clicks. No pedal edema. Gastrointestinal system: Abdomen is nondistended, soft and nontender. No organomegaly or masses felt. Normal bowel sounds heard. Central nervous system: Alert and oriented. No focal neurological deficits. Extremities: Symmetric 5 x 5 power. Skin: No rashes, lesions or ulcers Psychiatry: Judgement and insight appear normal. Mood & affect appropriate.     Data Reviewed: I have personally reviewed following labs and imaging studies  CBC: Recent Labs  Lab 05/04/21 0900 05/05/21 0221 05/06/21 0108 05/07/21 0234  WBC 13.8* 7.8 7.2 7.8  HGB 11.1* 10.2* 9.7* 10.1*  HCT 34.5* 30.7* 29.2* 30.5*  MCV 93.0 90.8 90.4 90.2  PLT 865* 745* 696* 653*   Basic Metabolic Panel: Recent Labs  Lab 05/04/21 0900 05/05/21 0221 05/06/21 0108 05/07/21 0234  NA 134* 134* 134* 136  K 3.8 4.1 3.7 4.3  CL 100 100 97* 97*  CO2 24 27 30 30   GLUCOSE 102* 112* 89  107*  BUN 10 <5* <5* 10  CREATININE 0.72 0.69 0.67 0.80  CALCIUM 8.7* 8.8* 8.6* 8.8*   GFR: CrCl cannot be calculated (Unknown ideal weight.). Liver Function Tests: Recent Labs  Lab 05/04/21 0900 05/05/21 0221 05/07/21 0234  AST 27 21 21   ALT 62* 53* 45*  ALKPHOS 91 73 67  BILITOT 0.6 0.5 0.5  PROT 7.5 6.7 6.1*  ALBUMIN 2.9* 2.4* 2.4*   No results for input(s): LIPASE, AMYLASE in the last 168 hours. No results for input(s): AMMONIA in the last 168 hours. Coagulation Profile: Recent Labs  Lab 05/05/21 0221  INR 1.2   Cardiac Enzymes: No results for input(s): CKTOTAL, CKMB, CKMBINDEX, TROPONINI in the last 168 hours. BNP (last 3 results) No results for input(s): PROBNP in the last 8760 hours. HbA1C: No results for input(s): HGBA1C in the last 72 hours. CBG: No results for input(s): GLUCAP in the last 168 hours. Lipid Profile: No results for input(s): CHOL, HDL, LDLCALC, TRIG, CHOLHDL, LDLDIRECT in the last 72 hours. Thyroid Function Tests: No results for input(s): TSH, T4TOTAL, FREET4, T3FREE, THYROIDAB in the last 72 hours. Anemia Panel: No results for input(s): VITAMINB12, FOLATE, FERRITIN, TIBC, IRON, RETICCTPCT in the last 72 hours. Sepsis Labs: Recent Labs  Lab 05/04/21 0901 05/04/21 1201  LATICACIDVEN 1.0  0.7    Recent Results (from the past 240 hour(s))  Resp Panel by RT-PCR (Flu A&B, Covid) Nasopharyngeal Swab     Status: None   Collection Time: 05/04/21 10:08 AM   Specimen: Nasopharyngeal Swab; Nasopharyngeal(NP) swabs in vial transport medium  Result Value Ref Range Status   SARS Coronavirus 2 by RT PCR NEGATIVE NEGATIVE Final    Comment: (NOTE) SARS-CoV-2 target nucleic acids are NOT DETECTED.  The SARS-CoV-2 RNA is generally detectable in upper respiratory specimens during the acute phase of infection. The lowest concentration of SARS-CoV-2 viral copies this assay can detect is 138 copies/mL. A negative result does not preclude  SARS-Cov-2 infection and should not be used as the sole basis for treatment or other patient management decisions. A negative result may occur with  improper specimen collection/handling, submission of specimen other than nasopharyngeal swab, presence of viral mutation(s) within the areas targeted by this assay, and inadequate number of viral copies(<138 copies/mL). A negative result must be combined with clinical observations, patient history, and epidemiological information. The expected result is Negative.  Fact Sheet for Patients:  BloggerCourse.com  Fact Sheet for Healthcare Providers:  SeriousBroker.it  This test is no t yet approved or cleared by the [place] FDA and  has been authorized for detection and/or diagnosis of SARS-CoV-2 by FDA under an Emergency Use Authorization (EUA). This EUA will remain  in effect (meaning this test can be used) for the duration of the COVID-19 declaration under Section 564(b)(1) of the Act, 21 U.S.C.section 360bbb-3(b)(1), unless the authorization is terminated  or revoked sooner.       Influenza A by PCR NEGATIVE NEGATIVE Final   Influenza B by PCR NEGATIVE NEGATIVE Final    Comment: (NOTE) The Xpert Xpress SARS-CoV-2/FLU/RSV plus assay is intended as an aid in the diagnosis of influenza from Nasopharyngeal swab specimens and should not be used as a sole basis for treatment. Nasal washings and aspirates are unacceptable for Xpert Xpress SARS-CoV-2/FLU/RSV testing.  Fact Sheet for Patients: BloggerCourse.com  Fact Sheet for Healthcare Providers: SeriousBroker.it  This test is not yet approved or cleared by the [place] FDA and has been authorized for detection and/or diagnosis of SARS-CoV-2 by FDA under an Emergency Use Authorization (EUA). This EUA will remain in effect (meaning this test can be used) for the duration of  the COVID-19 declaration under Section 564(b)(1) of the Act, 21 U.S.C. section 360bbb-3(b)(1), unless the authorization is terminated or revoked.  Performed at [facility], [address], [place], [place] [address]          Radiology Studies: No results found.      Scheduled Meds:  enoxaparin (LOVENOX) injection  40 mg Subcutaneous Q24H   pantoprazole  20 mg Oral Daily   polyethylene glycol  17 g Oral Daily   Continuous Infusions:  cefTRIAXone (ROCEPHIN)  IV Stopped (05/06/21 2230)     LOS: 3 days    Time spent: 38 minutes spent on chart review, discussion with nursing staff, consultants, updating family and interview/physical exam; more than 50% of that time was spent in counseling and/or coordination of care.    Alvira Philips [place], DO [facility] Available via Epic secure chat 7am-7pm After these hours, please refer to coverage provider listed on [email or web address] 05/07/2021, 5:46 PM

## 2021-05-07 NOTE — Progress Notes (Signed)
Brief Note   I informed Dr Renold Don that the blood culture reports in media section positive for strep intermedius and staph epi is related to another patient I was consulted yesterday and not of Sarah Eaton. I accidentally took pictures of these lab results yesterday.    Odette Fraction, MD Infectious Disease Physician [facility] [address] [place], [place] [address] Phone: [phone]  Fax: [phone]

## 2021-05-07 NOTE — Progress Notes (Addendum)
    [facility] has been requested to perform a transesophageal echocardiogram on this patient for bacteremia. After careful review of history and examination, the risks and benefits of transesophageal echocardiogram have been explained including risks of esophageal damage, perforation (1:10,000 risk), bleeding, pharyngeal hematoma as well as other potential complications associated with conscious sedation including aspiration, arrhythmia, respiratory failure and death. Alternatives to treatment were discussed, questions were answered. Patient is willing to proceed. This is scheduled for tomorrow at 11:30am with Dr. Bjorn Pippin (originally listed for 6/22 in EMR but had cancellation so able to accommodate tomorrow). Orders written including NPO after midnight except sips with meds.  Laurann [place], PA-C 05/07/2021 5:17 PM

## 2021-05-08 ENCOUNTER — Encounter (HOSPITAL_COMMUNITY): Payer: Self-pay | Admitting: Certified Registered Nurse Anesthetist

## 2021-05-08 ENCOUNTER — Other Ambulatory Visit (HOSPITAL_COMMUNITY): Payer: 59

## 2021-05-08 ENCOUNTER — Encounter (HOSPITAL_COMMUNITY)
Admission: EM | Disposition: A | Discharge status: Home / Self Care | Payer: Self-pay | Source: Home / Self Care | Attending: Internal Medicine

## 2021-05-08 LAB — CBC
HCT: 33.7 % — ABNORMAL LOW (ref 36.0–46.0)
Hemoglobin: 10.9 g/dL — ABNORMAL LOW (ref 12.0–15.0)
MCH: 29.6 pg (ref 26.0–34.0)
MCHC: 32.3 g/dL (ref 30.0–36.0)
MCV: 91.6 fL (ref 80.0–100.0)
Platelets: 688 10*3/uL — ABNORMAL HIGH (ref 150–400)
RBC: 3.68 MIL/uL — ABNORMAL LOW (ref 3.87–5.11)
RDW: 11.9 % (ref 11.5–15.5)
WBC: 7 10*3/uL (ref 4.0–10.5)
nRBC: 0 % (ref 0.0–0.2)

## 2021-05-08 LAB — COMPREHENSIVE METABOLIC PANEL
ALT: 40 U/L (ref 0–44)
AST: 16 U/L (ref 15–41)
Albumin: 2.7 g/dL — ABNORMAL LOW (ref 3.5–5.0)
Alkaline Phosphatase: 75 U/L (ref 38–126)
Anion gap: 9 (ref 5–15)
BUN: 12 mg/dL (ref 6–20)
CO2: 31 mmol/L (ref 22–32)
Calcium: 9.4 mg/dL (ref 8.9–10.3)
Chloride: 97 mmol/L — ABNORMAL LOW (ref 98–111)
Creatinine, Ser: 0.82 mg/dL (ref 0.44–1.00)
GFR, Estimated: >60 mL/min (ref >60)
Glucose, Bld: 86 mg/dL (ref 70–99)
Potassium: 4.9 mmol/L (ref 3.5–5.1)
Sodium: 137 mmol/L (ref 135–145)
Total Bilirubin: 0.4 mg/dL (ref 0.3–1.2)
Total Protein: 6.9 g/dL (ref 6.5–8.1)

## 2021-05-08 SURGERY — CANCELLED PROCEDURE

## 2021-05-08 MED ORDER — CEFDINIR 300 MG PO CAPS
300.0000 mg | ORAL_CAPSULE | Freq: Two times a day (BID) | ORAL | Status: DC
  Administered 2021-05-08 – 2021-05-09 (×3): 300 mg via ORAL
  Filled 2021-05-08 (×3): qty 1

## 2021-05-08 NOTE — Progress Notes (Signed)
[facility]  Date of Admission:  05/04/2021     CC: pyelonephritis  Lines: Peripheral iv's  Abx: 6/21-c cefdinir  6/18-21 ceftriaxone  6/20 once fluconazole   ASSESSMENT: Pyelonephritis  I discussed with dr Elinor Parkinson who consulted on this patient. The information regardign strep intermedius bacteremia was misplaced from another patient. This patient does not have bacteremia  She continues to do well clinically and tolerating PO well Ct abd/pelv no obvious abscess. Renal u/s no renal abscess  Duration of pyelo treatment would be 10 day  Ecoli in urine is non-esbl but resistant to bactrim/levoflox  Discussed with cardiology/hospitalist team and patient  PLAN: Cancelled tte/tee Transition abx to cefdinir 300 mg po bid -- finish total duration abx 10 days from 6/18 until 6/28 Ok to dc from id standpoint Will sign off  I spent more than 35 minute reviewing data/chart, and coordinating care and >50% direct face to face time providing counseling/discussing diagnostics/treatment plan with patient      Principal Problem:   Pyelonephritis Active Problems:   Sepsis due to Escherichia coli (E. coli) (HCC)   GERD (gastroesophageal reflux disease)   Migraine   Bacteremia   Allergies  Allergen Reactions   Banana Anaphylaxis   Tuna [Fish Allergy] Anaphylaxis    Tuna fish only per pt    Scheduled Meds:  cefdinir  300 mg Oral Q12H   enoxaparin (LOVENOX) injection  40 mg Subcutaneous Q24H   pantoprazole  20 mg Oral Daily   polyethylene glycol  17 g Oral Daily   Continuous Infusions:   PRN Meds:.acetaminophen **OR** acetaminophen, docusate sodium, fentaNYL (SUBLIMAZE) injection, metoprolol tartrate, ondansetron **OR** ondansetron (ZOFRAN) IV, oxyCODONE, promethazine-codeine, senna, zolpidem   SUBJECTIVE: Continue to do well No n/v/diarrhea No f/c  Discussed with dr Elinor Parkinson about paperwork that documents strep intermedius  bsi not belonging to patient Discussed with cards/primary team to cancel echo Updated patient  Review of Systems: ROS All other ROS was negative, except mentioned above     OBJECTIVE: Vitals:   05/07/21 2337 05/08/21 0332 05/08/21 0700 05/08/21 1100  BP: 105/81 101/73 110/76 109/77  Pulse: 83 81 72 87  Resp: 20 18 18 18   Temp: 97.6 F (36.4 C) 97.7 F (36.5 C) 98.1 F (36.7 C) 97.7 F (36.5 C)  TempSrc: Oral Oral Oral Oral  SpO2: 97% 97% (!) 8% 98%   There is no height or weight on file to calculate BMI.  Physical Exam General/constitutional: no distress, pleasant HEENT: Normocephalic, PER, Conj Clear, EOMI, Oropharynx clear Neck supple CV: rrr no mrg Lungs: clear to auscultation, normal respiratory effort Abd: Soft, Nontender Ext: no edema Skin: No Rash Neuro: nonfocal MSK: no peripheral joint swelling/tenderness/warmth; back spines nontender     Lab Results Lab Results  Component Value Date   WBC 7.0 05/08/2021   HGB 10.9 (L) 05/08/2021   HCT 33.7 (L) 05/08/2021   MCV 91.6 05/08/2021   PLT 688 (H) 05/08/2021    Lab Results  Component Value Date   CREATININE 0.82 05/08/2021   BUN 12 05/08/2021   NA 137 05/08/2021   K 4.9 05/08/2021   CL 97 (L) 05/08/2021   CO2 31 05/08/2021    Lab Results  Component Value Date   ALT 40 05/08/2021   AST 16 05/08/2021   ALKPHOS 75 05/08/2021   BILITOT 0.4 05/08/2021      Microbiology: Recent Results (from the past 240 hour(s))  Resp Panel by RT-PCR (  Flu A&B, Covid) Nasopharyngeal Swab     Status: None   Collection Time: 05/04/21 10:08 AM   Specimen: Nasopharyngeal Swab; Nasopharyngeal(NP) swabs in vial transport medium  Result Value Ref Range Status   SARS Coronavirus 2 by RT PCR NEGATIVE NEGATIVE Final    Comment: (NOTE) SARS-CoV-2 target nucleic acids are NOT DETECTED.  The SARS-CoV-2 RNA is generally detectable in upper respiratory specimens during the acute phase of infection. The  lowest concentration of SARS-CoV-2 viral copies this assay can detect is 138 copies/mL. A negative result does not preclude SARS-Cov-2 infection and should not be used as the sole basis for treatment or other patient management decisions. A negative result may occur with  improper specimen collection/handling, submission of specimen other than nasopharyngeal swab, presence of viral mutation(s) within the areas targeted by this assay, and inadequate number of viral copies(<138 copies/mL). A negative result must be combined with clinical observations, patient history, and epidemiological information. The expected result is Negative.  Fact Sheet for Patients:  BloggerCourse.com  Fact Sheet for Healthcare Providers:  SeriousBroker.it  This test is no t yet approved or cleared by the [place] FDA and  has been authorized for detection and/or diagnosis of SARS-CoV-2 by FDA under an Emergency Use Authorization (EUA). This EUA will remain  in effect (meaning this test can be used) for the duration of the COVID-19 declaration under Section 564(b)(1) of the Act, 21 U.S.C.section 360bbb-3(b)(1), unless the authorization is terminated  or revoked sooner.       Influenza A by PCR NEGATIVE NEGATIVE Final   Influenza B by PCR NEGATIVE NEGATIVE Final    Comment: (NOTE) The Xpert Xpress SARS-CoV-2/FLU/RSV plus assay is intended as an aid in the diagnosis of influenza from Nasopharyngeal swab specimens and should not be used as a sole basis for treatment. Nasal washings and aspirates are unacceptable for Xpert Xpress SARS-CoV-2/FLU/RSV testing.  Fact Sheet for Patients: BloggerCourse.com  Fact Sheet for Healthcare Providers: SeriousBroker.it  This test is not yet approved or cleared by the [place] FDA and has been authorized for detection and/or diagnosis of SARS-CoV-2 by FDA under  an Emergency Use Authorization (EUA). This EUA will remain in effect (meaning this test can be used) for the duration of the COVID-19 declaration under Section 564(b)(1) of the Act, 21 U.S.C. section 360bbb-3(b)(1), unless the authorization is terminated or revoked.  Performed at [facility], [address] [place], [place] [address]      Serology:   Imaging: If present, new imagings (plain films, ct scans, and mri) have been personally visualized and interpreted; radiology reports have been reviewed. Decision making incorporated into the Impression / Recommendations.  6/17 ct abd pelv 1. The left kidney is subtly edematous. Left kidney has an inhomogeneous appearance with areas of decreased attenuation in the upper pole region, not felt to represent simple cyst. Slight hydronephrosis on the left. This combination of findings is concerning for potential lobar nephronia/bacterial nephritis on the left. Appropriate laboratory correlation in this regard advised.   2. No renal or ureteral calculi on either side. Urinary bladder wall thickness normal.   3. Left ovary is larger than right ovary with left ovary appearing equivocally edematous. Advise pelvic ultrasound with Doppler assessment to further evaluate left ovary. No mass beyond apparent follicle in left ovary.   4. Left tubal ligation clip is displaced from the fallopian tube and located in the dependent portion of the pelvis in the midline. Right tubal ligation clip in fallopian  tube region on the right.   5. No bowel wall thickening or bowel obstruction. No abscess in the abdomen pelvis. No periappendiceal region inflammation evident.  6/18 renal u/s 1. No hydronephrosis. 2. No discrete drainable left intraparenchymal or perinephric fluid collections localized.  Raymondo Band, MD [facility] [phone] pager    05/08/2021, 12:44 PM

## 2021-05-08 NOTE — Progress Notes (Signed)
PROGRESS NOTE    Sarah Eaton  ZDG:[id number] DOB: [birth year] DOA: 05/04/2021 PCP: Patient, No Pcp Per (Inactive)    Brief Narrative:  Sarah Eaton is a 35 year old female with past medical history significant for migraine headache, GERD, ovarian cyst who presented to [facility], ED on 6/17 with fevers and flank pain.  Patient recently hospitalized in [place] and discharged on 613 for pyelonephritis/E. coli UTI to complete antibiotics with Augmentin, but despite treatment patient continued to spike fevers up to 103.0 F.  Patient reports dry cough without dyspnea.  Patient was recently hospitalized at [facility] for sepsis, pyelonephritis secondary to resistant E. coli.  Patient was admitted from 04/21/2021 through 04/30/2021 and treated with IV Zosyn/tobramycin.  Patient was seen by ID and urology during hospitalization with CT abdomen/pelvis indicating small renal abscesses, seen by IR and felt to be too small to drain.  Patient was discharged on 04/30/2021 with instructions to take oral Augmentin for another 10 days.  In the ED, temperature 102.4 F.  WBC 13 K, CT abdomen/pelvis with small fluid collection/nephronia.  EDP consulted urology.  Patient was started on IV antibiotics.  Duration consulted for further evaluation and management.   Assessment & Plan:   Principal Problem:   Pyelonephritis Active Problems:   Sepsis due to Escherichia coli (E. coli) (HCC)   GERD (gastroesophageal reflux disease)   Migraine   Bacteremia   Pyelonephritis Resistant E. coli UTI Patient presenting to the ED with recurrent fever despite recent prolonged hospitalization with IV antibiotics followed by oral Augmentin.  Patient was noted to have an elevated WBC count of 13 point 8K with temperature 102.4.  Chest x-ray with no acute cardiopulmonary disease process.  CT abdomen/pelvis with contrast with slightly edematous left kidney, slight hydronephrosis on the left concerning for lobar  nephronia/bacterial nephritis. --Infectious disease following, appreciate assistance --WBC 13.8>>7.8>7.0 --Blood cultures x2 6/17: Pending --Cefdinir 300mg  PO BID --Oxycodone 5-10 mg p.o. every 4 hours as needed moderate pain --Fentanyl 12.5-50 mcg IV every 2 hours as needed severe pain   History of migraines No complaints of headache currently.  Patient has been off of triptan/Topamax for more than 1 year.  Outpatient follow-up with PCP.  GERD: Continue PPI  Ovarian cyst CT abdomen/pelvis with left ovary slightly larger than right with left air ovary appearing equivocally edematous.  Pelvic ultrasound with this 3 cm indeterminate/benign left ovarian lesion.  Recommend outpatient follow-up ultrasound in 6-12 weeks.  Outpatient follow-up with GYN  Constipation --Senna daily prn --Colace 100mg  PO BID prn --MiraLAX PO daily   DVT prophylaxis: enoxaparin (LOVENOX) injection 40 mg Start: 05/06/21 1515 SCDs Start: 05/04/21 1723   Code Status: Full Code Family Communication: None present at bedside this morning  Disposition Plan:  Level of care: Med-Surg Status is: Inpatient  Remains inpatient appropriate because:Ongoing diagnostic testing needed not appropriate for outpatient work up, Unsafe d/c plan, IV treatments appropriate due to intensity of illness or inability to take PO, and Inpatient level of care appropriate due to severity of illness  Dispo: The patient is from: Home              Anticipated d/c is to: Home              Patient currently is not medically stable to d/c.   Difficult to place patient No   Consultants:  Infectious disease Urology  Procedures:  none  Antimicrobials:  Cefdinir 6/21>> Ceftriaxone 6/18 - 6/21 Zosyn 6/17-6/17 Meropenem 6/17-6/18 Fluconazole 6/20  x 1    Subjective: Patient resting comfortably in bed.  Continues with mild left flank pain.  Afebrile past 24 hours.  No other questions or concerns at this time.  Denies headache, no  dizziness, no chest pain, no palpitations, no fever/chills/night sweats, no nausea/vomiting/diarrhea, no abdominal pain, no weakness, no fatigue, no paresthesias.  No acute events overnight per nurse staff.   Objective: Vitals:   05/07/21 2337 05/08/21 0332 05/08/21 0700 05/08/21 1100  BP: 105/81 101/73 110/76 109/77  Pulse: 83 81 72 87  Resp: 20 18 18 18   Temp: 97.6 F (36.4 C) 97.7 F (36.5 C) 98.1 F (36.7 C) 97.7 F (36.5 C)  TempSrc: Oral Oral Oral Oral  SpO2: 97% 97% (!) 8% 98%    Intake/Output Summary (Last 24 hours) at 05/08/2021 1354 Last data filed at 05/07/2021 2200 Gross per 24 hour  Intake 720 ml  Output --  Net 720 ml   There were no vitals filed for this visit.  Examination:  General exam: Appears calm and comfortable  Respiratory system: Clear to auscultation. Respiratory effort normal. Cardiovascular system: S1 & S2 heard, RRR. No JVD, murmurs, rubs, gallops or clicks. No pedal edema. Gastrointestinal system: Abdomen is nondistended, soft and nontender. No organomegaly or masses felt. Normal bowel sounds heard. Central nervous system: Alert and oriented. No focal neurological deficits. Extremities: Symmetric 5 x 5 power. Skin: No rashes, lesions or ulcers Psychiatry: Judgement and insight appear normal. Mood & affect appropriate.     Data Reviewed: I have personally reviewed following labs and imaging studies  CBC: Recent Labs  Lab 05/04/21 0900 05/05/21 0221 05/06/21 0108 05/07/21 0234 05/08/21 0104  WBC 13.8* 7.8 7.2 7.8 7.0  HGB 11.1* 10.2* 9.7* 10.1* 10.9*  HCT 34.5* 30.7* 29.2* 30.5* 33.7*  MCV 93.0 90.8 90.4 90.2 91.6  PLT 865* 745* 696* 653* 688*   Basic Metabolic Panel: Recent Labs  Lab 05/04/21 0900 05/05/21 0221 05/06/21 0108 05/07/21 0234 05/08/21 0104  NA 134* 134* 134* 136 137  K 3.8 4.1 3.7 4.3 4.9  CL 100 100 97* 97* 97*  CO2 24 27 30 30 31   GLUCOSE 102* 112* 89 107* 86  BUN 10 <5* <5* 10 12  CREATININE 0.72 0.69 0.67  0.80 0.82  CALCIUM 8.7* 8.8* 8.6* 8.8* 9.4   GFR: CrCl cannot be calculated (Unknown ideal weight.). Liver Function Tests: Recent Labs  Lab 05/04/21 0900 05/05/21 0221 05/07/21 0234 05/08/21 0104  AST 27 21 21 16   ALT 62* 53* 45* 40  ALKPHOS 91 73 67 75  BILITOT 0.6 0.5 0.5 0.4  PROT 7.5 6.7 6.1* 6.9  ALBUMIN 2.9* 2.4* 2.4* 2.7*   No results for input(s): LIPASE, AMYLASE in the last 168 hours. No results for input(s): AMMONIA in the last 168 hours. Coagulation Profile: Recent Labs  Lab 05/05/21 0221  INR 1.2   Cardiac Enzymes: No results for input(s): CKTOTAL, CKMB, CKMBINDEX, TROPONINI in the last 168 hours. BNP (last 3 results) No results for input(s): PROBNP in the last 8760 hours. HbA1C: No results for input(s): HGBA1C in the last 72 hours. CBG: No results for input(s): GLUCAP in the last 168 hours. Lipid Profile: No results for input(s): CHOL, HDL, LDLCALC, TRIG, CHOLHDL, LDLDIRECT in the last 72 hours. Thyroid Function Tests: No results for input(s): TSH, T4TOTAL, FREET4, T3FREE, THYROIDAB in the last 72 hours. Anemia Panel: No results for input(s): VITAMINB12, FOLATE, FERRITIN, TIBC, IRON, RETICCTPCT in the last 72 hours. Sepsis Labs: Recent Labs  Lab 05/04/21 0901 05/04/21 1201  LATICACIDVEN 1.0 0.7    Recent Results (from the past 240 hour(s))  Resp Panel by RT-PCR (Flu A&B, Covid) Nasopharyngeal Swab     Status: None   Collection Time: 05/04/21 10:08 AM   Specimen: Nasopharyngeal Swab; Nasopharyngeal(NP) swabs in vial transport medium  Result Value Ref Range Status   SARS Coronavirus 2 by RT PCR NEGATIVE NEGATIVE Final    Comment: (NOTE) SARS-CoV-2 target nucleic acids are NOT DETECTED.  The SARS-CoV-2 RNA is generally detectable in upper respiratory specimens during the acute phase of infection. The lowest concentration of SARS-CoV-2 viral copies this assay can detect is 138 copies/mL. A negative result does not preclude SARS-Cov-2 infection  and should not be used as the sole basis for treatment or other patient management decisions. A negative result may occur with  improper specimen collection/handling, submission of specimen other than nasopharyngeal swab, presence of viral mutation(s) within the areas targeted by this assay, and inadequate number of viral copies(<138 copies/mL). A negative result must be combined with clinical observations, patient history, and epidemiological information. The expected result is Negative.  Fact Sheet for Patients:  BloggerCourse.com  Fact Sheet for Healthcare Providers:  SeriousBroker.it  This test is no t yet approved or cleared by the [place] FDA and  has been authorized for detection and/or diagnosis of SARS-CoV-2 by FDA under an Emergency Use Authorization (EUA). This EUA will remain  in effect (meaning this test can be used) for the duration of the COVID-19 declaration under Section 564(b)(1) of the Act, 21 U.S.C.section 360bbb-3(b)(1), unless the authorization is terminated  or revoked sooner.       Influenza A by PCR NEGATIVE NEGATIVE Final   Influenza B by PCR NEGATIVE NEGATIVE Final    Comment: (NOTE) The Xpert Xpress SARS-CoV-2/FLU/RSV plus assay is intended as an aid in the diagnosis of influenza from Nasopharyngeal swab specimens and should not be used as a sole basis for treatment. Nasal washings and aspirates are unacceptable for Xpert Xpress SARS-CoV-2/FLU/RSV testing.  Fact Sheet for Patients: BloggerCourse.com  Fact Sheet for Healthcare Providers: SeriousBroker.it  This test is not yet approved or cleared by the [place] FDA and has been authorized for detection and/or diagnosis of SARS-CoV-2 by FDA under an Emergency Use Authorization (EUA). This EUA will remain in effect (meaning this test can be used) for the duration of the COVID-19 declaration  under Section 564(b)(1) of the Act, 21 U.S.C. section 360bbb-3(b)(1), unless the authorization is terminated or revoked.  Performed at [facility], [address] [place], [place] [address]          Radiology Studies: No results found.      Scheduled Meds:  cefdinir  300 mg Oral Q12H   enoxaparin (LOVENOX) injection  40 mg Subcutaneous Q24H   pantoprazole  20 mg Oral Daily   polyethylene glycol  17 g Oral Daily   Continuous Infusions:     LOS: 4 days    Time spent: 35 minutes spent on chart review, discussion with nursing staff, consultants, updating family and interview/physical exam; more than 50% of that time was spent in counseling and/or coordination of care.    Alvira Philips [place], DO [facility] Available via Epic secure chat 7am-7pm After these hours, please refer to coverage provider listed on [email or web address] 05/08/2021, 1:54 PM

## 2021-05-09 SURGERY — ECHOCARDIOGRAM, TRANSESOPHAGEAL
Anesthesia: Monitor Anesthesia Care

## 2021-05-09 MED ORDER — CEFDINIR 300 MG PO CAPS: 300.0000 mg | ORAL_CAPSULE | Freq: Two times a day (BID) | ORAL | 0 refills | Status: AC

## 2021-05-09 MED ORDER — GUAIFENESIN-CODEINE 100-10 MG/5ML PO SOLN: 5.0000 mL | Freq: Four times a day (QID) | ORAL | 0 refills | Status: DC | PRN

## 2021-05-09 MED ORDER — FLUCONAZOLE 150 MG PO TABS: 150.0000 mg | ORAL_TABLET | Freq: Once | ORAL | 0 refills | Status: AC

## 2021-05-09 MED ORDER — OXYCODONE HCL 5 MG PO CAPS: 10.0000 mg | ORAL_CAPSULE | ORAL | 0 refills | Status: DC | PRN

## 2021-05-09 NOTE — Discharge Summary (Signed)
Physician Discharge Summary  Sarah Eaton ZOX:[id number] DOB: [birth year] DOA: 05/04/2021  PCP: Patient, No Pcp Per (Inactive)  Admit date: 05/04/2021 Discharge date: 05/09/2021  Admitted From: Home Disposition: Home  Recommendations for Outpatient Follow-up:  Follow up with PCP in 1-2 weeks Continue cefdinir 300 mg p.o. twice daily to complete antibiotic course for pyelonephritis Follow-up with urology, Dr. Mena Goes, call for appointment Patient to schedule appointment with her primary GYN regarding ovarian cyst with recommendation of follow-up pelvic ultrasound in 6-12 weeks  Home Health: No Equipment/Devices: None  Discharge Condition: Stable CODE STATUS: Full code Diet recommendation: Regular diet  History of present illness:  Sarah Eaton is a 35 year old female with past medical history significant for migraine headache, GERD, ovarian cyst who presented to [facility], ED on 6/17 with fevers and flank pain.  Patient recently hospitalized in [place] and discharged on 613 for pyelonephritis/E. coli UTI to complete antibiotics with Augmentin, but despite treatment patient continued to spike fevers up to 103.0 F.  Patient reports dry cough without dyspnea.   Patient was recently hospitalized at [facility] for sepsis, pyelonephritis secondary to resistant E. coli.  Patient was admitted from 04/21/2021 through 04/30/2021 and treated with IV Zosyn/tobramycin.  Patient was seen by ID and urology during hospitalization with CT abdomen/pelvis indicating small renal abscesses, seen by IR and felt to be too small to drain.  Patient was discharged on 04/30/2021 with instructions to take oral Augmentin for another 10 days.   In the ED, temperature 102.4 F.  WBC 13 K, CT abdomen/pelvis with small fluid collection/nephronia.  EDP consulted urology.  Patient was started on IV antibiotics.  Duration consulted for further evaluation and management.  Hospital  course:  Pyelonephritis Resistant E. coli UTI Patient presenting to the ED with recurrent fever despite recent prolonged hospitalization with IV antibiotics followed by oral Augmentin.  Patient was noted to have an elevated WBC count of 13 point 8K with temperature 102.4.  Chest x-ray with no acute cardiopulmonary disease process.  CT abdomen/pelvis with contrast with slightly edematous left kidney, slight hydronephrosis on the left concerning for lobar nephronia/bacterial nephritis.  The patient was started on IV ceftriaxone.  Urology and infectious disease was consulted and followed during hospital course.  Patient's WBC count improved from 13.8-7.0 at time of discharge.  Patient has been afebrile over the last 48 hours.  Patient will continue antibiotics with cefdinir 300 mg p.o. twice daily to complete antibiotic course which is in accordance with her urine culture from [facility] with E. coli to complete treatment course of 10 days per ID.  Outpatient follow-up with urology, Dr. Mena Goes.    History of migraines No complaints of headache currently.  Patient has been off of triptan/Topamax for more than 1 year.  Outpatient follow-up with PCP.   GERD: Continue PPI   Ovarian cyst CT abdomen/pelvis with left ovary slightly larger than right with left air ovary appearing equivocally edematous.  Pelvic ultrasound with this 3 cm indeterminate/benign left ovarian lesion.  Recommend outpatient follow-up ultrasound in 6-12 weeks.  Outpatient follow-up with GYN   Discharge Diagnoses:  Principal Problem:   Pyelonephritis Active Problems:   Sepsis due to Escherichia coli (E. coli) (HCC)   GERD (gastroesophageal reflux disease)   Migraine   Bacteremia    Discharge Instructions  Discharge Instructions     Call MD for:  difficulty breathing, headache or visual disturbances   Complete by: As directed    Call MD for:  extreme fatigue   Complete by: As directed    Call MD for:   persistant dizziness or light-headedness   Complete by: As directed    Call MD for:  persistant nausea and vomiting   Complete by: As directed    Call MD for:  severe uncontrolled pain   Complete by: As directed    Call MD for:  temperature >100.4   Complete by: As directed    Diet - low sodium heart healthy   Complete by: As directed    Increase activity slowly   Complete by: As directed       Allergies as of 05/09/2021       Reactions   Banana Anaphylaxis   Tuna [fish Allergy] Anaphylaxis   Tuna fish only per pt        Medication List     STOP taking these medications    amoxicillin-clavulanate 500-125 MG tablet Commonly known as: AUGMENTIN   cyanocobalamin 1000 MCG/ML injection Commonly known as: (VITAMIN B-12)   oxyCODONE 5 MG immediate release tablet Commonly known as: Oxy IR/ROXICODONE Replaced by: oxycodone 5 MG capsule   pantoprazole 20 MG tablet Commonly known as: Protonix   phentermine 37.5 MG tablet Commonly known as: ADIPEX-P   rizatriptan 10 MG tablet Commonly known as: MAXALT   topiramate 100 MG tablet Commonly known as: TOPAMAX   valACYclovir 1000 MG tablet Commonly known as: VALTREX   zolpidem 5 MG tablet Commonly known as: AMBIEN       TAKE these medications    acetaminophen 325 MG tablet Commonly known as: TYLENOL Take 325 mg by mouth every 6 (six) hours as needed for mild pain.   cefdinir 300 MG capsule Commonly known as: OMNICEF Take 1 capsule (300 mg total) by mouth every 12 (twelve) hours for 8 days.   fluconazole 150 MG tablet Commonly known as: DIFLUCAN Take 1 tablet (150 mg total) by mouth once for 1 dose.   guaiFENesin-codeine 100-10 MG/5ML syrup Take 5 mLs by mouth every 6 (six) hours as needed for cough.   oxycodone 5 MG capsule Commonly known as: OXY-IR Take 2 capsules (10 mg total) by mouth every 4 (four) hours as needed for up to 7 days. Replaces: oxyCODONE 5 MG immediate release tablet         Follow-up Information     Jerilee Field, MD. Call.   Specialty: Urology Why: Raynelle Fanning - you can see me at our [facility], [phone]) or I can see you at [facility] (address above) if that is more convenient - Dr. Tommas Olp information: [address] [place] [address] [phone]                Allergies  Allergen Reactions   Banana Anaphylaxis   [place] Gum [Fish Allergy] Anaphylaxis    Tuna fish only per pt    Consultations: Urology, Dr. Mena Goes Infectious disease, Dr. Renold Don   Procedures/Studies: DG Chest 2 View  Result Date: 05/04/2021 CLINICAL DATA:  Cough and fever EXAM: CHEST - 2 VIEW COMPARISON:  July 04, 2020 FINDINGS: Lungs are clear. Heart size and pulmonary vascularity are normal. No adenopathy. No bone lesions. IMPRESSION: Lungs clear.  Cardiac silhouette normal. Electronically Signed   By: Bretta Bang III M.D.   On: 05/04/2021 09:17   CT Abdomen Pelvis W Contrast  Result Date: 05/04/2021 CLINICAL DATA:  Abdominal pain and fever EXAM: CT ABDOMEN AND PELVIS WITH CONTRAST TECHNIQUE: Multidetector CT imaging of  the abdomen and pelvis was performed using the standard protocol following bolus administration of intravenous contrast. CONTRAST:  26mL OMNIPAQUE IOHEXOL 300 MG/ML  SOLN COMPARISON:  December 04, 2012 FINDINGS: Lower chest: Lung bases are clear. Hepatobiliary: No focal liver lesions are appreciable on this noncontrast enhanced study. Gallbladder wall is not appreciably thickened. No biliary duct dilatation. Pancreas: There is no pancreatic mass or inflammatory focus. Spleen: No splenic lesions are evident. Adrenals/Urinary Tract: Adrenals bilaterally appear normal. There are areas of decreased attenuation throughout portions of the left kidney with mixed attenuation throughout the left kidney. Left kidney appears subtly edematous. There is a somewhat complex area of ill-defined  decreased attenuation along the upper pole left kidney measuring 2.8 x 2.1 cm. No evident mass involving the right kidney. There is subtle fullness of the left renal collecting system. No similar fullness on the right. No appreciable renal or ureteral calculus evident on either side. Note that the left ureter is slightly larger than the right ureter. Urinary bladder is midline with wall thickness within normal limits. Stomach/Bowel: No appreciable bowel wall or mesenteric thickening. No appreciable bowel obstruction. Terminal ileum appears normal. No periappendiceal region inflammation evident. No evident free air or portal venous air. Vascular/Lymphatic: No dominant aneurysm. No arterial vascular lesions are evident. Major venous structures appear patent. There is no appreciable adenopathy in the abdomen or pelvis. Reproductive: Uterus is antegrade. The left ovary is subtly edematous. Probable follicle within the left ovary measuring 2.21.8 cm. Right adnexal region appears normal by CT. Tubal ligation clip noted on the right. Second tubal ligation clip is in the lower pelvis and appears to have been displaced from the left fallopian tube. Other: No abscess or ascites evident in the abdomen or pelvis. There is slight fat in the umbilicus. Musculoskeletal: No blastic or lytic bone lesions. No abdominal wall or intramuscular lesions. IMPRESSION: 1. The left kidney is subtly edematous. Left kidney has an inhomogeneous appearance with areas of decreased attenuation in the upper pole region, not felt to represent simple cyst. Slight hydronephrosis on the left. This combination of findings is concerning for potential lobar nephronia/bacterial nephritis on the left. Appropriate laboratory correlation in this regard advised. 2. No renal or ureteral calculi on either side. Urinary bladder wall thickness normal. 3. Left ovary is larger than right ovary with left ovary appearing equivocally edematous. Advise pelvic ultrasound  with Doppler assessment to further evaluate left ovary. No mass beyond apparent follicle in left ovary. 4. Left tubal ligation clip is displaced from the fallopian tube and located in the dependent portion of the pelvis in the midline. Right tubal ligation clip in fallopian tube region on the right. 5. No bowel wall thickening or bowel obstruction. No abscess in the abdomen pelvis. No periappendiceal region inflammation evident. Electronically Signed   By: Bretta Bang III M.D.   On: 05/04/2021 11:48   US RENAL  Result Date: 05/05/2021 CLINICAL DATA:  Pyelonephritis. Small intraparenchymal/perinephric collections suspected on CT. EXAM: RENAL / URINARY TRACT ULTRASOUND COMPLETE COMPARISON:  CT from previous day FINDINGS: Right Kidney: Renal measurements: 11.7 x 4.6 x 6.8 = volume: 192 mL. Echogenicity within normal limits. No mass or hydronephrosis visualized. Left Kidney: Renal measurements: 12 x 6.5 x 5.2 = volume: 223 mL. Echogenicity within normal limits. No mass or hydronephrosis. No perinephric collection localized. Bladder: Incompletely distended Other: None. IMPRESSION: 1. No hydronephrosis. 2. No discrete drainable left intraparenchymal or perinephric fluid collections localized. Electronically Signed   By: Ronald Pippins.D.  On: 05/05/2021 13:02   US PELVIC COMPLETE W TRANSVAGINAL AND TORSION R/O  Result Date: 05/04/2021 CLINICAL DATA:  Left lower quadrant pain. Abnormal appearance of left ovary on recent CT. Previous endometrial ablation. EXAM: TRANSABDOMINAL AND TRANSVAGINAL ULTRASOUND OF PELVIS DOPPLER ULTRASOUND OF OVARIES TECHNIQUE: Both transabdominal and transvaginal ultrasound examinations of the pelvis were performed. Transabdominal technique was performed for global imaging of the pelvis including uterus, ovaries, adnexal regions, and pelvic cul-de-sac. It was necessary to proceed with endovaginal exam following the transabdominal exam to visualize the endometrium and ovaries. Color  and duplex Doppler ultrasound was utilized to evaluate blood flow to the ovaries. COMPARISON:  CT on 05/04/2021 FINDINGS: Uterus Measurements: 8.8 x 3.6 x 4.8 cm = volume: 80 mL. Heterogeneous appearance of myometrium is noted with indistinct endometrial-myometrial junction, which is attributable to prior endometrial ablation. No distinct myometrial masses are identified. Endometrium Thickness: 8 mm. A small cystic focus is seen in the upper portion of the endometrial cavity, likely the sequela of previous endometrial ablation. Right ovary Measurements: 2.9 x 2.1 x 1.6 cm = volume: 6 mL. Normal appearance/no adnexal mass. Left ovary Measurements: 4.5 x 2.1 x 4.7 cm = volume: 23 mL. A heterogeneous hypoechoic lesion is seen in the left ovary, which measures 2.7 x 1.7 x 3.0 cm and shows no evidence of internal blood flow on color Doppler ultrasound. These features are suggestive of but not classic for a hemorrhagic ovarian cyst. Pulsed Doppler evaluation of both ovaries demonstrates normal low-resistance arterial and venous waveforms. Other findings No abnormal free fluid. IMPRESSION: 3 cm indeterminate but probably benign left ovarian lesion, which may represent a hemorrhagic ovarian cyst. Recommend follow-up by transvaginal pelvic ultrasound in 6-12 weeks to confirm resolution. This recommendation follows the consensus statement: Management of Asymptomatic Ovarian and Other Adnexal Cysts Imaged at US: Society of Radiologists in Ultrasound Consensus Conference Statement. Radiology 2010; [phone]. No sonographic evidence for ovarian torsion. Findings consistent with previous endometrial ablation. Electronically Signed   By: Danae OrleansJohn A Stahl M.D.   On: 05/04/2021 13:52      Subjective: Patient seen examined at bedside, resting comfortably.  No complaints this morning.  Ready for discharge home.  Remains afebrile.  Discussed need follow-up with GYN and urology.  No other questions or concerns at this time.  Denies  headache, no fever/chills/night sweats, no nausea/vomiting/diarrhea, no chest pain, no palpitations, no abdominal pain, no weakness, no fatigue, no cough/congestion, no paresthesias.  No acute events overnight per nursing staff.  Discharge Exam: Vitals:   05/09/21 0417 05/09/21 0700  BP: 98/72 98/80  Pulse: 73 84  Resp: 18 20  Temp: 97.8 F (36.6 C) 97.8 F (36.6 C)  SpO2: 96% 98%   Vitals:   05/08/21 2008 05/09/21 0049 05/09/21 0417 05/09/21 0700  BP: 116/89 112/80 98/72 98/80   Pulse: 78 76 73 84  Resp: 18 17 18 20   Temp: 98.3 F (36.8 C) 97.9 F (36.6 C) 97.8 F (36.6 C) 97.8 F (36.6 C)  TempSrc: Oral Oral Oral Oral  SpO2: 97% 100% 96% 98%    General: Pt is alert, awake, not in acute distress Cardiovascular: RRR, S1/S2 +, no rubs, no gallops Respiratory: CTA bilaterally, no wheezing, no rhonchi, on room air Abdominal: Soft, NT, ND, bowel sounds + Extremities: no edema, no cyanosis    The results of significant diagnostics from this hospitalization (including imaging, microbiology, ancillary and laboratory) are listed below for reference.     Microbiology: Recent Results (from the past 240 hour(s))  Resp Panel by RT-PCR (Flu A&B, Covid) Nasopharyngeal Swab     Status: None   Collection Time: 05/04/21 10:08 AM   Specimen: Nasopharyngeal Swab; Nasopharyngeal(NP) swabs in vial transport medium  Result Value Ref Range Status   SARS Coronavirus 2 by RT PCR NEGATIVE NEGATIVE Final    Comment: (NOTE) SARS-CoV-2 target nucleic acids are NOT DETECTED.  The SARS-CoV-2 RNA is generally detectable in upper respiratory specimens during the acute phase of infection. The lowest concentration of SARS-CoV-2 viral copies this assay can detect is 138 copies/mL. A negative result does not preclude SARS-Cov-2 infection and should not be used as the sole basis for treatment or other patient management decisions. A negative result may occur with  improper specimen  collection/handling, submission of specimen other than nasopharyngeal swab, presence of viral mutation(s) within the areas targeted by this assay, and inadequate number of viral copies(<138 copies/mL). A negative result must be combined with clinical observations, patient history, and epidemiological information. The expected result is Negative.  Fact Sheet for Patients:  BloggerCourse.com  Fact Sheet for Healthcare Providers:  SeriousBroker.it  This test is no t yet approved or cleared by the [place] FDA and  has been authorized for detection and/or diagnosis of SARS-CoV-2 by FDA under an Emergency Use Authorization (EUA). This EUA will remain  in effect (meaning this test can be used) for the duration of the COVID-19 declaration under Section 564(b)(1) of the Act, 21 U.S.C.section 360bbb-3(b)(1), unless the authorization is terminated  or revoked sooner.       Influenza A by PCR NEGATIVE NEGATIVE Final   Influenza B by PCR NEGATIVE NEGATIVE Final    Comment: (NOTE) The Xpert Xpress SARS-CoV-2/FLU/RSV plus assay is intended as an aid in the diagnosis of influenza from Nasopharyngeal swab specimens and should not be used as a sole basis for treatment. Nasal washings and aspirates are unacceptable for Xpert Xpress SARS-CoV-2/FLU/RSV testing.  Fact Sheet for Patients: BloggerCourse.com  Fact Sheet for Healthcare Providers: SeriousBroker.it  This test is not yet approved or cleared by the [place] FDA and has been authorized for detection and/or diagnosis of SARS-CoV-2 by FDA under an Emergency Use Authorization (EUA). This EUA will remain in effect (meaning this test can be used) for the duration of the COVID-19 declaration under Section 564(b)(1) of the Act, 21 U.S.C. section 360bbb-3(b)(1), unless the authorization is terminated or revoked.  Performed at [facility], [address] [place], [place] [address]      Labs: BNP (last 3 results) No results for input(s): BNP in the last 8760 hours. Basic Metabolic Panel: Recent Labs  Lab 05/04/21 0900 05/05/21 0221 05/06/21 0108 05/07/21 0234 05/08/21 0104  NA 134* 134* 134* 136 137  K 3.8 4.1 3.7 4.3 4.9  CL 100 100 97* 97* 97*  CO2 24 27 30 30 31   GLUCOSE 102* 112* 89 107* 86  BUN 10 <5* <5* 10 12  CREATININE 0.72 0.69 0.67 0.80 0.82  CALCIUM 8.7* 8.8* 8.6* 8.8* 9.4   Liver Function Tests: Recent Labs  Lab 05/04/21 0900 05/05/21 0221 05/07/21 0234 05/08/21 0104  AST 27 21 21 16   ALT 62* 53* 45* 40  ALKPHOS 91 73 67 75  BILITOT 0.6 0.5 0.5 0.4  PROT 7.5 6.7 6.1* 6.9  ALBUMIN 2.9* 2.4* 2.4* 2.7*   No results for input(s): LIPASE, AMYLASE in the last 168 hours. No results for input(s): AMMONIA in the last 168 hours. CBC: Recent Labs  Lab 05/04/21 0900  05/05/21 0221 05/06/21 0108 05/07/21 0234 05/08/21 0104  WBC 13.8* 7.8 7.2 7.8 7.0  HGB 11.1* 10.2* 9.7* 10.1* 10.9*  HCT 34.5* 30.7* 29.2* 30.5* 33.7*  MCV 93.0 90.8 90.4 90.2 91.6  PLT 865* 745* 696* 653* 688*   Cardiac Enzymes: No results for input(s): CKTOTAL, CKMB, CKMBINDEX, TROPONINI in the last 168 hours. BNP: Invalid input(s): POCBNP CBG: No results for input(s): GLUCAP in the last 168 hours. D-Dimer No results for input(s): DDIMER in the last 72 hours. Hgb A1c No results for input(s): HGBA1C in the last 72 hours. Lipid Profile No results for input(s): CHOL, HDL, LDLCALC, TRIG, CHOLHDL, LDLDIRECT in the last 72 hours. Thyroid function studies No results for input(s): TSH, T4TOTAL, T3FREE, THYROIDAB in the last 72 hours.  Invalid input(s): FREET3 Anemia work up No results for input(s): VITAMINB12, FOLATE, FERRITIN, TIBC, IRON, RETICCTPCT in the last 72 hours. Urinalysis    Component Value Date/Time   COLORURINE YELLOW 05/04/2021 0901   APPEARANCEUR CLEAR 05/04/2021 0901   APPEARANCEUR Clear  08/09/2014 1750   LABSPEC 1.025 05/04/2021 0901   LABSPEC 1.026 08/09/2014 1750   PHURINE 5.0 05/04/2021 0901   GLUCOSEU NEGATIVE 05/04/2021 0901   GLUCOSEU Negative 08/09/2014 1750   HGBUR NEGATIVE 05/04/2021 0901   BILIRUBINUR NEGATIVE 05/04/2021 0901   BILIRUBINUR neg 01/25/2020 1453   BILIRUBINUR Negative 08/09/2014 1750   KETONESUR NEGATIVE 05/04/2021 0901   PROTEINUR NEGATIVE 05/04/2021 0901   UROBILINOGEN 0.2 01/25/2020 1453   UROBILINOGEN 0.2 06/11/2014 1755   NITRITE NEGATIVE 05/04/2021 0901   LEUKOCYTESUR NEGATIVE 05/04/2021 0901   LEUKOCYTESUR Negative 08/09/2014 1750   Sepsis Labs Invalid input(s): PROCALCITONIN,  WBC,  LACTICIDVEN Microbiology Recent Results (from the past 240 hour(s))  Resp Panel by RT-PCR (Flu A&B, Covid) Nasopharyngeal Swab     Status: None   Collection Time: 05/04/21 10:08 AM   Specimen: Nasopharyngeal Swab; Nasopharyngeal(NP) swabs in vial transport medium  Result Value Ref Range Status   SARS Coronavirus 2 by RT PCR NEGATIVE NEGATIVE Final    Comment: (NOTE) SARS-CoV-2 target nucleic acids are NOT DETECTED.  The SARS-CoV-2 RNA is generally detectable in upper respiratory specimens during the acute phase of infection. The lowest concentration of SARS-CoV-2 viral copies this assay can detect is 138 copies/mL. A negative result does not preclude SARS-Cov-2 infection and should not be used as the sole basis for treatment or other patient management decisions. A negative result may occur with  improper specimen collection/handling, submission of specimen other than nasopharyngeal swab, presence of viral mutation(s) within the areas targeted by this assay, and inadequate number of viral copies(<138 copies/mL). A negative result must be combined with clinical observations, patient history, and epidemiological information. The expected result is Negative.  Fact Sheet for Patients:  BloggerCourse.com  Fact Sheet for  Healthcare Providers:  SeriousBroker.it  This test is no t yet approved or cleared by the [place] FDA and  has been authorized for detection and/or diagnosis of SARS-CoV-2 by FDA under an Emergency Use Authorization (EUA). This EUA will remain  in effect (meaning this test can be used) for the duration of the COVID-19 declaration under Section 564(b)(1) of the Act, 21 U.S.C.section 360bbb-3(b)(1), unless the authorization is terminated  or revoked sooner.       Influenza A by PCR NEGATIVE NEGATIVE Final   Influenza B by PCR NEGATIVE NEGATIVE Final    Comment: (NOTE) The Xpert Xpress SARS-CoV-2/FLU/RSV plus assay is intended as an aid in the diagnosis of influenza from Nasopharyngeal swab  specimens and should not be used as a sole basis for treatment. Nasal washings and aspirates are unacceptable for Xpert Xpress SARS-CoV-2/FLU/RSV testing.  Fact Sheet for Patients: BloggerCourse.com  Fact Sheet for Healthcare Providers: SeriousBroker.it  This test is not yet approved or cleared by the [place] FDA and has been authorized for detection and/or diagnosis of SARS-CoV-2 by FDA under an Emergency Use Authorization (EUA). This EUA will remain in effect (meaning this test can be used) for the duration of the COVID-19 declaration under Section 564(b)(1) of the Act, 21 U.S.C. section 360bbb-3(b)(1), unless the authorization is terminated or revoked.  Performed at [facility], [address] [place], [place] [address]      Time coordinating discharge: Over 30 minutes  SIGNED:   Alvira Philips [place], DO  [facility] 05/09/2021, 9:04 AM

## 2021-05-09 NOTE — Progress Notes (Signed)
Discharge paperwork reviewed with pt. Pt verbalized understanding. Pt has packed up all of her belongings. PIV has been removed. Pt awaiting for her transport to arrive to take her home.

## 2021-05-09 NOTE — Progress Notes (Signed)
Pt's transport has arrived. Pt taken downstairs to lobby via wheelchair by NT. Pt's belongings taken with her. Pt alert and oriented x4 in NAD upon discharge.

## 2021-05-15 ENCOUNTER — Other Ambulatory Visit: Payer: Self-pay | Admitting: Urology

## 2021-05-15 ENCOUNTER — Other Ambulatory Visit (HOSPITAL_COMMUNITY): Payer: Self-pay | Admitting: Urology

## 2021-05-16 ENCOUNTER — Other Ambulatory Visit: Payer: Self-pay

## 2021-05-16 ENCOUNTER — Ambulatory Visit (INDEPENDENT_AMBULATORY_CARE_PROVIDER_SITE_OTHER): Payer: 59 | Admitting: Obstetrics and Gynecology

## 2021-05-16 ENCOUNTER — Other Ambulatory Visit: Payer: Self-pay | Admitting: Urology

## 2021-05-16 ENCOUNTER — Encounter: Payer: Self-pay | Admitting: Obstetrics and Gynecology

## 2021-05-16 VITALS — BP 131/89 | HR 88 | Ht 63.0 in | Wt 177.8 lb

## 2021-05-16 DIAGNOSIS — Z87448 Personal history of other diseases of urinary system: Secondary | ICD-10-CM | POA: Diagnosis not present

## 2021-05-16 DIAGNOSIS — R102 Pelvic and perineal pain: Secondary | ICD-10-CM | POA: Diagnosis not present

## 2021-05-16 DIAGNOSIS — N12 Tubulo-interstitial nephritis, not specified as acute or chronic: Secondary | ICD-10-CM

## 2021-05-16 DIAGNOSIS — N83202 Unspecified ovarian cyst, left side: Secondary | ICD-10-CM

## 2021-05-16 LAB — CULTURE, BLOOD (ROUTINE X 2)
Culture: NO GROWTH
Culture: NO GROWTH
Special Requests: ADEQUATE
Special Requests: ADEQUATE

## 2021-05-16 NOTE — Patient Instructions (Signed)
Ovarian Cyst °An ovarian cyst is a fluid-filled sac on an ovary. Most of these cysts go away on their own and are not cancer. Some cysts need treatment. °What are the causes? °Ovarian hyperstimulation syndrome. Some medicines may lead to this problem. °Polycystic ovarian syndrome (PCOS). Problems with body chemicals (hormones) can lead to this condition. °The normal menstrual cycle. °What increases the risk? °Being overweight or very overweight. °Taking medicines to increase your chance of getting pregnant. °Using some types of birth control. °Smoking. °What are the signs or symptoms? °Many ovarian cysts do not cause symptoms. If you get symptoms, you may have: °Pain or pressure in the area between the hip bones. °Pain in the lower belly. °Pain during sex. °Swelling in the lower belly. °Periods that are not regular. °Pain with periods. °How is this treated? °Many ovarian cysts go away on their own without treatment. If you need treatment, it may include: °Medicines for pain. °Fluid taken out of the cyst. °The cyst being taken out. °Birth control pills or other medicines. °Surgery to remove the ovary. °Follow these instructions at home: °Take over-the-counter and prescription medicines only as told by your doctor. °Ask your doctor if you should avoid driving or using machines while you are taking your medicine. °Get exams and Pap tests as told by your doctor. °Return to your normal activities when your doctor says that it is safe. °Do not smoke or use any products that contain nicotine or tobacco. If you need help quitting, ask your doctor. °Keep all follow-up visits. °Contact a doctor if: °Your periods: °Are late. °Are not regular. °Stop. °Are painful. °You have pain in the area between your hip bones, and the pain does not go away. °You feel pressure on your bladder. °You have trouble peeing. °You feel full, or your belly hurts, swells, or bloats. °You gain or lose weight without trying, or you are less hungry than  normal. °You feel pain and pressure in your back. °You feel pain and pressure in the area between your hip bones. °You think you may be pregnant. °Get help right away if: °You have pain in your belly that is very bad or gets worse. °You have pain in the area between your hip bones, and the pain is very bad or gets worse. °You cannot eat or drink without vomiting. °You get a fever or chills all of a sudden. °Your period is a lot heavier than usual. °Summary °An ovarian cyst is a fluid-filled sac on an ovary. °Some cysts may cause problems and need treatment. °Most of these cysts go away on their own. °This information is not intended to replace advice given to you by your health care provider. Make sure you discuss any questions you have with your health care provider. °Document Revised: 04/13/2020 Document Reviewed: 04/13/2020 °Elsevier Patient Education © 2022 [employer] ° °

## 2021-05-16 NOTE — Progress Notes (Signed)
Pt present today due to follow up from ED visit. Pt stated having left side pain and was dx with ovarian cyst on the left ovary.

## 2021-05-16 NOTE — Progress Notes (Signed)
GYNECOLOGY PROGRESS NOTE  Subjective:    Patient ID: Sarah Eaton, female    DOB: 1986/02/05, 35 y.o.   MRN: [id number]  HPI  Patient is a 35 y.o. [id number] female who presents for follow up from recent Emergency Room visit and recent admission in [place] last week due to fevers and left flank plain.  Of note, she had also been recently hospitalized in [place] and discharged with a diagnosis of pyelonephritis/E. Coli UTI the week prior. Of note, patient also has a history of ovarian cyst.  Was instructed to follow up with GYN.   The following portions of the patient's history were reviewed and updated as appropriate: allergies, current medications, past family history, past medical history, past social history, past surgical history, and problem list.  Review of Systems Pertinent items noted in HPI and remainder of comprehensive ROS otherwise negative.   Objective:   Blood pressure 131/89, pulse 88, height 5\' 3"  (1.6 m), weight 177 lb 12.8 oz (80.6 kg). General appearance: alert and no distress Abdomen: soft, non-tender; bowel sounds normal; no masses,  no organomegaly Pelvic: deferred.   Extremities: extremities normal, atraumatic, no cyanosis or edema Neurologic: Grossly normal   Imaging:   CLINICAL DATA:  Abdominal pain and fever   EXAM: CT ABDOMEN AND PELVIS WITH CONTRAST   TECHNIQUE: Multidetector CT imaging of the abdomen and pelvis was performed using the standard protocol following bolus administration of intravenous contrast.   CONTRAST:  47mL OMNIPAQUE IOHEXOL 300 MG/ML  SOLN   COMPARISON:  December 04, 2012   FINDINGS: Lower chest: Lung bases are clear.   Hepatobiliary: No focal liver lesions are appreciable on this noncontrast enhanced study. Gallbladder wall is not appreciably thickened. No biliary duct dilatation.   Pancreas: There is no pancreatic mass or inflammatory focus.   Spleen: No splenic lesions are evident.   Adrenals/Urinary Tract:  Adrenals bilaterally appear normal. There are areas of decreased attenuation throughout portions of the left kidney with mixed attenuation throughout the left kidney. Left kidney appears subtly edematous. There is a somewhat complex area of ill-defined decreased attenuation along the upper pole left kidney measuring 2.8 x 2.1 cm. No evident mass involving the right kidney. There is subtle fullness of the left renal collecting system. No similar fullness on the right. No appreciable renal or ureteral calculus evident on either side. Note that the left ureter is slightly larger than the right ureter. Urinary bladder is midline with wall thickness within normal limits.   Stomach/Bowel: No appreciable bowel wall or mesenteric thickening. No appreciable bowel obstruction. Terminal ileum appears normal. No periappendiceal region inflammation evident. No evident free air or portal venous air.   Vascular/Lymphatic: No dominant aneurysm. No arterial vascular lesions are evident. Major venous structures appear patent. There is no appreciable adenopathy in the abdomen or pelvis.   Reproductive: Uterus is antegrade. The left ovary is subtly edematous. Probable follicle within the left ovary measuring 2.21.8 cm. Right adnexal region appears normal by CT. Tubal ligation clip noted on the right. Second tubal ligation clip is in the lower pelvis and appears to have been displaced from the left fallopian tube.   Other: No abscess or ascites evident in the abdomen or pelvis. There is slight fat in the umbilicus.   Musculoskeletal: No blastic or lytic bone lesions. No abdominal wall or intramuscular lesions.   IMPRESSION: 1. The left kidney is subtly edematous. Left kidney has an inhomogeneous appearance with areas of decreased attenuation in the upper  pole region, not felt to represent simple cyst. Slight hydronephrosis on the left. This combination of findings is concerning for potential lobar  nephronia/bacterial nephritis on the left. Appropriate laboratory correlation in this regard advised.   2. No renal or ureteral calculi on either side. Urinary bladder wall thickness normal.   3. Left ovary is larger than right ovary with left ovary appearing equivocally edematous. Advise pelvic ultrasound with Doppler assessment to further evaluate left ovary. No mass beyond apparent follicle in left ovary.   4. Left tubal ligation clip is displaced from the fallopian tube and located in the dependent portion of the pelvis in the midline. Right tubal ligation clip in fallopian tube region on the right.   5. No bowel wall thickening or bowel obstruction. No abscess in the abdomen pelvis. No periappendiceal region inflammation evident.     Electronically Signed   By: Bretta Bang III M.D.   On: 05/04/2021 11:48    CLINICAL DATA:  Left lower quadrant pain. Abnormal appearance of left ovary on recent CT. Previous endometrial ablation.   EXAM: TRANSABDOMINAL AND TRANSVAGINAL ULTRASOUND OF PELVIS   DOPPLER ULTRASOUND OF OVARIES   TECHNIQUE: Both transabdominal and transvaginal ultrasound examinations of the pelvis were performed. Transabdominal technique was performed for global imaging of the pelvis including uterus, ovaries, adnexal regions, and pelvic cul-de-sac.   It was necessary to proceed with endovaginal exam following the transabdominal exam to visualize the endometrium and ovaries. Color and duplex Doppler ultrasound was utilized to evaluate blood flow to the ovaries.   COMPARISON:  CT on 05/04/2021   FINDINGS: Uterus   Measurements: 8.8 x 3.6 x 4.8 cm = volume: 80 mL. Heterogeneous appearance of myometrium is noted with indistinct endometrial-myometrial junction, which is attributable to prior endometrial ablation. No distinct myometrial masses are identified.   Endometrium   Thickness: 8 mm. A small cystic focus is seen in the upper portion of the  endometrial cavity, likely the sequela of previous endometrial ablation.   Right ovary   Measurements: 2.9 x 2.1 x 1.6 cm = volume: 6 mL. Normal appearance/no adnexal mass.   Left ovary   Measurements: 4.5 x 2.1 x 4.7 cm = volume: 23 mL. A heterogeneous hypoechoic lesion is seen in the left ovary, which measures 2.7 x 1.7 x 3.0 cm and shows no evidence of internal blood flow on color Doppler ultrasound. These features are suggestive of but not classic for a hemorrhagic ovarian cyst.   Pulsed Doppler evaluation of both ovaries demonstrates normal low-resistance arterial and venous waveforms.   Other findings   No abnormal free fluid.   IMPRESSION: 3 cm indeterminate but probably benign left ovarian lesion, which may represent a hemorrhagic ovarian cyst. Recommend follow-up by transvaginal pelvic ultrasound in 6-12 weeks to confirm resolution. This recommendation follows the consensus statement: Management of Asymptomatic Ovarian and Other Adnexal Cysts Imaged at [place]: Society of Radiologists in Ultrasound Consensus Conference Statement. Radiology 2010; [phone].   No sonographic evidence for ovarian torsion.   Findings consistent with previous endometrial ablation.     Electronically Signed   By: Danae Orleans M.D.   On: 05/04/2021 13:52   Assessment:   Ovarian cyst Left pelvic pain H/o pyelonephritis  Plan:   Reviewed recent admission records.  Discussed that the ovarian cyst was likely hemorrhagic in nature and would likely resolve without intervention as it was small (3 cm). Not a likely significant source of her left pelvic and flank pain H/o pyelonephritis, s/p 2 hospitalizations  in the past month. Has f/u with Urology. Has been taking antibiotics as prescribed.  To f/u in 6-8 weeks if pain is still persistent and can determine next steps in management.    Hildred Laser, MD [facility]

## 2021-05-19 ENCOUNTER — Encounter: Payer: Self-pay | Admitting: Obstetrics and Gynecology

## 2021-05-31 ENCOUNTER — Telehealth: Payer: Self-pay | Admitting: Obstetrics and Gynecology

## 2021-05-31 NOTE — Telephone Encounter (Signed)
Pt called asking to speak to nurse, states that she has ovarian cysts that she believes has ruptured. Pt sates an ablation a while back and has not had period since, pt started cramping and abdominal pain started a few days ago, bleeding started today. Please Advise.

## 2021-05-31 NOTE — Telephone Encounter (Signed)
Patient called no answer LM via VM suggested that if she thinks her cyst has ruptured then she may want to go to the ER.

## 2021-06-01 NOTE — Telephone Encounter (Signed)
Patient called no answer. LM via VM that I was calling to check on her and her symptoms. Pt was advised to call the office or go to the emergency room for treatment.

## 2021-06-06 DIAGNOSIS — B009 Herpesviral infection, unspecified: Secondary | ICD-10-CM

## 2021-06-06 MED ORDER — ACYCLOVIR 400 MG PO TABS: 400.0000 mg | ORAL_TABLET | Freq: Every day | ORAL | 5 refills | Status: DC

## 2021-06-13 ENCOUNTER — Ambulatory Visit: Payer: 59 | Admitting: Internal Medicine

## 2021-06-18 MED ORDER — VALACYCLOVIR HCL 1 G PO TABS: 1000.0000 mg | ORAL_TABLET | Freq: Every day | ORAL | 2 refills | Status: DC

## 2021-06-18 NOTE — Addendum Note (Signed)
Addended by: Fabian November on: 06/18/2021 01:45 PM   Modules accepted: Orders

## 2021-06-25 ENCOUNTER — Ambulatory Visit (HOSPITAL_COMMUNITY)
Admission: RE | Admit: 2021-06-25 | Discharge: 2021-06-25 | Disposition: A | Discharge status: Home / Self Care | Payer: 59 | Source: Ambulatory Visit | Attending: Urology | Admitting: Urology

## 2021-06-25 ENCOUNTER — Other Ambulatory Visit: Payer: Self-pay

## 2021-06-25 DIAGNOSIS — N12 Tubulo-interstitial nephritis, not specified as acute or chronic: Secondary | ICD-10-CM | POA: Insufficient documentation

## 2021-07-02 ENCOUNTER — Other Ambulatory Visit: Payer: Self-pay

## 2021-07-02 ENCOUNTER — Encounter: Payer: Self-pay | Admitting: Urology

## 2021-07-02 ENCOUNTER — Ambulatory Visit (INDEPENDENT_AMBULATORY_CARE_PROVIDER_SITE_OTHER): Payer: 59 | Admitting: Urology

## 2021-07-02 VITALS — BP 130/75 | HR 108

## 2021-07-02 DIAGNOSIS — N12 Tubulo-interstitial nephritis, not specified as acute or chronic: Secondary | ICD-10-CM | POA: Diagnosis not present

## 2021-07-02 LAB — URINALYSIS, ROUTINE W REFLEX MICROSCOPIC
Bilirubin, UA: NEGATIVE
Glucose, UA: NEGATIVE
Ketones, UA: NEGATIVE
Leukocytes,UA: NEGATIVE
Nitrite, UA: NEGATIVE
Protein,UA: NEGATIVE
RBC, UA: NEGATIVE
Specific Gravity, UA: 1.015 (ref 1.005–1.030)
Urobilinogen, Ur: 0.2 mg/dL (ref 0.2–1.0)
pH, UA: 7 (ref 5.0–7.5)

## 2021-07-02 NOTE — Progress Notes (Signed)
07/02/2021 11:30 AM   Sarah Eaton 04/06/1986 [id number]  Referring provider: No referring provider defined for this encounter.  CC: pyelonephritis   HPI:  New patient for this office-  1) pyelonephritis-seen as hospital consult 05/04/2021 for a left pyelonephritis.  She had been admitted to another hospital for left pyelonephritis and discharged on Augmentin.  She continued to have fevers chills and left flank pain.  She was readmitted to [facility] with white count of 13.8 and creatinine 0.72.  CT scan of the abdomen and pelvis showed a lobar nephronia/focal nephritis of the left kidney upper pole about 3 cm, but no focal abscess/fluid to drain.  Renal ultrasound was obtained the next day 05/05/2021 and again no fluid collection was noted.  ID was consulted and urine culture from outside hospital grew 9 ESBL E. coli.  She was switched from meropenem to ceftriaxone and did well and discharged on cefdinir 300 mg p.o. twice daily to finish a duration of 10 days.  F/u US 06/25/2021 noted there to be no obvious lingering abnormality, scarring or fluid collection.  No prior h/o kidney infections but parents told her she did have UTIs as a child. No GU surgery. Constipation.   UA clear today.   She is Airline pilot for [employer].   PMH: Past Medical History:  Diagnosis Date   Hypertension    Obesity    Ovarian cyst     Surgical History: Past Surgical History:  Procedure Laterality Date   CESAREAN SECTION     CESAREAN SECTION WITH BILATERAL TUBAL LIGATION Bilateral 08/05/2013   Procedure: REPEAT CESAREAN SECTION WITH BILATERAL TUBAL LIGATION;  Surgeon: Allie Bossier, MD;  Location: [facility];  Service: Obstetrics;  Laterality: Bilateral;   TUBAL LIGATION      Home Medications:  Allergies as of 07/02/2021       Reactions   Banana Anaphylaxis   [place] Gum [fish Allergy] Anaphylaxis   Tuna fish only per pt        Medication List        Accurate  as of July 02, 2021 11:30 AM. If you have any questions, ask your nurse or doctor.          acetaminophen 325 MG tablet Commonly known as: TYLENOL Take 325 mg by mouth every 6 (six) hours as needed for mild pain.   acetaminophen 500 MG tablet Commonly known as: TYLENOL Take 500 mg by mouth every 6 (six) hours as needed.   guaiFENesin-codeine 100-10 MG/5ML syrup Take 5 mLs by mouth every 6 (six) hours as needed for cough.   valACYclovir 1000 MG tablet Commonly known as: VALTREX Take 1 tablet (1,000 mg total) by mouth daily.        Allergies:  Allergies  Allergen Reactions   Banana Anaphylaxis   [place] Gum [Fish Allergy] Anaphylaxis    Tuna fish only per pt    Family History: Family History  Problem Relation Age of Onset   Cancer Other        breast   Hypertension Mother    Cancer Mother        skin   Thyroid disease Mother    Cancer Maternal Grandmother    Hypertension Maternal Grandmother    Thyroid disease Father    Thyroid disease Paternal Grandmother     Social History:  reports that she has never smoked. She has never used smokeless tobacco. She reports that she does not currently use alcohol. She reports that  she does not use drugs.   Physical Exam: There were no vitals taken for this visit.  Constitutional:  Alert and oriented, No acute distress. HEENT: [place] AT, moist mucus membranes.  Trachea midline, no masses. Cardiovascular: No clubbing, cyanosis, or edema. Respiratory: Normal respiratory effort, no increased work of breathing. GI: Abdomen is soft, nontender, nondistended, no abdominal masses GU: No CVA tenderness Skin: No rashes, bruises or suspicious lesions. Neurologic: Grossly intact, no focal deficits, moving all 4 extremities. Psychiatric: Normal mood and affect.  Laboratory Data: Lab Results  Component Value Date   WBC 7.0 05/08/2021   HGB 10.9 (L) 05/08/2021   HCT 33.7 (L) 05/08/2021   MCV 91.6 05/08/2021   PLT 688 (H) 05/08/2021     Lab Results  Component Value Date   CREATININE 0.82 05/08/2021      Lab Results  Component Value Date   TESTOSTERONE 12 03/13/2018    Lab Results  Component Value Date   HGBA1C 4.9 07/01/2019    Urinalysis    Component Value Date/Time   COLORURINE YELLOW 05/04/2021 0901   APPEARANCEUR CLEAR 05/04/2021 0901   APPEARANCEUR Clear 08/09/2014 1750   LABSPEC 1.025 05/04/2021 0901   LABSPEC 1.026 08/09/2014 1750   PHURINE 5.0 05/04/2021 0901   GLUCOSEU NEGATIVE 05/04/2021 0901   GLUCOSEU Negative 08/09/2014 1750   HGBUR NEGATIVE 05/04/2021 0901   BILIRUBINUR NEGATIVE 05/04/2021 0901   BILIRUBINUR neg 01/25/2020 1453   BILIRUBINUR Negative 08/09/2014 1750   KETONESUR NEGATIVE 05/04/2021 0901   PROTEINUR NEGATIVE 05/04/2021 0901   UROBILINOGEN 0.2 01/25/2020 1453   UROBILINOGEN 0.2 06/11/2014 1755   NITRITE NEGATIVE 05/04/2021 0901   LEUKOCYTESUR NEGATIVE 05/04/2021 0901   LEUKOCYTESUR Negative 08/09/2014 1750    Lab Results  Component Value Date   BACTERIA NONE SEEN 03/04/2018    Pertinent Imaging: CT 06/22, [place] 08/22 No results found for this or any previous visit.  No results found for this or any previous visit.  No results found for this or any previous visit.  No results found for this or any previous visit.  Results for orders placed during the hospital encounter of 06/25/21  US RENAL  Narrative CLINICAL DATA:  Follow-up pyelonephritis  EXAM: RENAL / URINARY TRACT ULTRASOUND COMPLETE  COMPARISON:  05/05/2021  FINDINGS: Right Kidney:  Renal measurements: 11.3 x 6.0 x 5.7 cm. = volume: 201 mL. Echogenicity within normal limits. No mass or hydronephrosis visualized.  Left Kidney:  Renal measurements: 11.1 x 5.6 x 4.7 cm. = volume: 158 mL. Echogenicity within normal limits. No mass or hydronephrosis visualized.  Bladder:  Appears normal for degree of bladder distention.  Other:  None.  IMPRESSION: No acute abnormality  noted.   Electronically Signed By: Alcide Clever M.D. On: 06/25/2021 20:56  No results found for this or any previous visit.  No results found for this or any previous visit.  No results found for this or any previous visit.   Assessment & Plan:    Pyelonephritis - she should not have long term sequelae. F/u US benign. Discussed proper fluid intake.   No follow-ups on file.  Jerilee Field, MD  [facility]  [address] [place], [place] [address] [phone]

## 2021-07-02 NOTE — Progress Notes (Signed)
Urological Symptom Review  Patient is experiencing the following symptoms: Frequent urination Hard to postpone urination Get up at night to urinate Blood in urine Urinary tract infection Vaginal bleeding (female only)   Review of Systems  Gastrointestinal (upper)  : Indigestion/heartburn  Gastrointestinal (lower) : Constipation  Constitutional : Negative for symptoms  Skin: Negative for skin symptoms  Eyes: Negative for eye symptoms  Ear/Nose/Throat : Negative for Ear/Nose/Throat symptoms  Hematologic/Lymphatic: Negative for Hematologic/Lymphatic symptoms  Cardiovascular : Negative for cardiovascular symptoms  Respiratory : Negative for respiratory symptoms  Endocrine: Excessive thirst  Musculoskeletal: Negative for musculoskeletal symptoms  Neurological: Negative for neurological symptoms  Psychologic: Negative for psychiatric symptoms

## 2021-07-13 ENCOUNTER — Other Ambulatory Visit: Payer: Self-pay

## 2021-07-13 ENCOUNTER — Ambulatory Visit (INDEPENDENT_AMBULATORY_CARE_PROVIDER_SITE_OTHER): Payer: 59 | Admitting: Internal Medicine

## 2021-07-13 ENCOUNTER — Encounter: Payer: Self-pay | Admitting: Internal Medicine

## 2021-07-13 VITALS — BP 123/89 | HR 82 | Temp 98.4°F | Resp 20 | Ht 62.0 in | Wt 189.0 lb

## 2021-07-13 DIAGNOSIS — Z114 Encounter for screening for human immunodeficiency virus [HIV]: Secondary | ICD-10-CM | POA: Diagnosis not present

## 2021-07-13 DIAGNOSIS — Z7689 Persons encountering health services in other specified circumstances: Secondary | ICD-10-CM | POA: Diagnosis not present

## 2021-07-13 DIAGNOSIS — E669 Obesity, unspecified: Secondary | ICD-10-CM

## 2021-07-13 DIAGNOSIS — Z1159 Encounter for screening for other viral diseases: Secondary | ICD-10-CM

## 2021-07-13 DIAGNOSIS — G43009 Migraine without aura, not intractable, without status migrainosus: Secondary | ICD-10-CM

## 2021-07-13 DIAGNOSIS — Z87448 Personal history of other diseases of urinary system: Secondary | ICD-10-CM

## 2021-07-13 MED ORDER — RIZATRIPTAN BENZOATE 10 MG PO TABS: 10.0000 mg | ORAL_TABLET | ORAL | 0 refills | Status: DC | PRN

## 2021-07-13 MED ORDER — PHENTERMINE HCL 37.5 MG PO TABS: 37.5000 mg | ORAL_TABLET | Freq: Every day | ORAL | 2 refills | Status: DC

## 2021-07-13 MED ORDER — TOPIRAMATE 25 MG PO TABS: 25.0000 mg | ORAL_TABLET | Freq: Two times a day (BID) | ORAL | 2 refills | Status: DC

## 2021-07-13 NOTE — Patient Instructions (Addendum)
Please start taking Topiramate for migraine. Take Maxalt as needed for migraine.  Avoid noisy environment and prolonged sunlight exposure.  Please get fasting blood tests within a week.  Please continue to follow low carb diet and perform moderate exercise/walking at least 150 mins/week.

## 2021-07-13 NOTE — Progress Notes (Signed)
New Patient Office Visit  Subjective:  Patient ID: Sarah Eaton, female    DOB: Nov 26, 1985  Age: 35 y.o. MRN: [id number]  CC:  Chief Complaint  Patient presents with   New Patient (Initial Visit)    HPI Sarah Eaton is a 35 year old female with PMH of migraine, obesity, Shingles and pyelonephritis who presents for establishing care.  She has been diagnosed with Migraine in the past and was taking Topamax and Maxalt for it. She has not had her medications for last 2 years. She has been having episodes of headache, with photo- and phonophobia. She has about 1-2 episodes per week. She has to work in front of 2 screens everyday and it makes her symptoms worse. She has been keeping her room dark, which has helped.  She is concerned about her weight and asks to be placed on weight loss medication. She has been doing portion control and exercises regularly. She has tried Adipex in the past with limited response. She admits that she has been stressed at home lately.  She was recently hospitalized twice for pyelonephritis, which was treated IV followed by oral cephalosporin. She denies any dysuria, hematuria or urinary hesitance/resistance currently. She has been evaluated by Urology.  She has had endometrial ablation and follows up with Ob/Gyn for h/o ovarian cyst.  She has not had COVID vaccine. She received flu vaccine in the office today.  Past Medical History:  Diagnosis Date   Hypertension    Obesity    Ovarian cyst    Pyelonephritis 05/04/2021   Sepsis due to Escherichia coli (E. coli) ([place]) 05/04/2021    Past Surgical History:  Procedure Laterality Date   CESAREAN SECTION     CESAREAN SECTION WITH BILATERAL TUBAL LIGATION Bilateral 08/05/2013   Procedure: REPEAT CESAREAN SECTION WITH BILATERAL TUBAL LIGATION;  Surgeon: Emily Filbert, MD;  Location: [facility];  Service: Obstetrics;  Laterality: Bilateral;   TUBAL LIGATION      Family History  Problem Relation Age of Onset    Cancer Other        breast   Hypertension Mother    Cancer Mother        skin   Thyroid disease Mother    Cancer Maternal Grandmother    Hypertension Maternal Grandmother    Thyroid disease Father    Thyroid disease Paternal Grandmother     Social History   Socioeconomic History   Marital status: Married    Spouse name: Not on file   Number of children: Not on file   Years of education: Not on file   Highest education level: Not on file  Occupational History   Not on file  Tobacco Use   Smoking status: Never   Smokeless tobacco: Never  Vaping Use   Vaping Use: Never used  Substance and Sexual Activity   Alcohol use: Not Currently    Comment: occasionally   Drug use: Never   Sexual activity: Yes    Birth control/protection: Surgical  Other Topics Concern   Not on file  Social History Narrative   Not on file   Social Determinants of Health   Financial Resource Strain: Not on file  Food Insecurity: Not on file  Transportation Needs: Not on file  Physical Activity: Not on file  Stress: Not on file  Social Connections: Not on file  Intimate Partner Violence: Not on file    ROS Review of Systems  Constitutional:  Negative for chills and fever.  HENT:  Negative for congestion, sinus pressure, sinus pain and sore throat.   Eyes:  Negative for pain and discharge.  Respiratory:  Negative for cough and shortness of breath.   Cardiovascular:  Negative for chest pain and palpitations.  Gastrointestinal:  Negative for abdominal pain, constipation, diarrhea, nausea and vomiting.  Endocrine: Negative for polydipsia and polyuria.  Genitourinary:  Negative for dysuria and hematuria.  Musculoskeletal:  Negative for neck pain and neck stiffness.  Skin:  Negative for rash.  Neurological:  Positive for headaches. Negative for dizziness and weakness.  Psychiatric/Behavioral:  Negative for agitation and behavioral problems. The patient is nervous/anxious.    Objective:    Today's Vitals: BP 123/89 (BP Location: Right Arm, Patient Position: Sitting, Cuff Size: Large)   Pulse 82   Temp 98.4 F (36.9 C)   Resp 20   Ht 5' 2"  (1.575 m)   Wt 189 lb (85.7 kg)   SpO2 98%   BMI 34.57 kg/m   Physical Exam Vitals reviewed.  Constitutional:      General: She is not in acute distress.    Appearance: She is obese. She is not diaphoretic.  HENT:     Head: Normocephalic and atraumatic.     Nose: Nose normal.     Mouth/Throat:     Mouth: Mucous membranes are moist.  Eyes:     General: No scleral icterus.    Extraocular Movements: Extraocular movements intact.  Cardiovascular:     Rate and Rhythm: Normal rate and regular rhythm.     Pulses: Normal pulses.     Heart sounds: Normal heart sounds. No murmur heard. Pulmonary:     Breath sounds: Normal breath sounds. No wheezing or rales.  Abdominal:     Palpations: Abdomen is soft.     Tenderness: There is no abdominal tenderness.  Musculoskeletal:     Cervical back: Neck supple. No tenderness.     Right lower leg: No edema.     Left lower leg: No edema.  Skin:    General: Skin is warm.     Findings: No rash.  Neurological:     General: No focal deficit present.     Mental Status: She is alert and oriented to person, place, and time.     Sensory: No sensory deficit.     Motor: No weakness.  Psychiatric:        Mood and Affect: Mood normal.        Behavior: Behavior normal.    Assessment & Plan:   Problem List Items Addressed This Visit       Encounter to establish care - Primary   Care established Previous chart reviewed History and medications reviewed with the patient     Relevant Orders  CBC with Differential/Platelet  CMP14+EGFR  Lipid panel  TSH  Vitamin D (25 hydroxy)    Cardiovascular and Mediastinum   Migraine    Started Topiramate for migraine ppx Maxalt PRN for migraine flare up      Relevant Medications   rizatriptan (MAXALT) 10 MG tablet   topiramate (TOPAMAX) 25  MG tablet     Other   Obesity (BMI 30-39.9)    Body mass index is 34.57 kg/m.  She has been following low carb diet and portion control Exercises regularly Has tried Adipex in the past Used to take Topiramate for migraine Restarted Adipex for now      Relevant Medications   phentermine (ADIPEX-P) 37.5 MG tablet   History of pyelonephritis  Had prolonged hospital course - 2 admission in 04/2021 Hospital chart reviewed including discharge summary Now better, denies any active c/o dysuria, hematuria or flank pain Has been evaluated by Urology      Other Visit Diagnoses     Need for hepatitis C screening test       Relevant Orders   Hepatitis C Antibody   Encounter for screening for HIV       Relevant Orders   HIV antibody (with reflex)       Outpatient Encounter Medications as of 07/13/2021  Medication Sig   phentermine (ADIPEX-P) 37.5 MG tablet Take 1 tablet (37.5 mg total) by mouth daily before breakfast.   rizatriptan (MAXALT) 10 MG tablet Take 1 tablet (10 mg total) by mouth as needed for migraine. May repeat in 2 hours if needed   topiramate (TOPAMAX) 25 MG tablet Take 1 tablet (25 mg total) by mouth 2 (two) times daily.   [DISCONTINUED] acetaminophen (TYLENOL) 325 MG tablet Take 325 mg by mouth every 6 (six) hours as needed for mild pain.   [DISCONTINUED] acetaminophen (TYLENOL) 500 MG tablet Take 500 mg by mouth every 6 (six) hours as needed.   [DISCONTINUED] guaiFENesin-codeine 100-10 MG/5ML syrup Take 5 mLs by mouth every 6 (six) hours as needed for cough.   [DISCONTINUED] valACYclovir (VALTREX) 1000 MG tablet Take 1 tablet (1,000 mg total) by mouth daily.   No facility-administered encounter medications on file as of 07/13/2021.    Follow-up: Return in about 3 months (around 10/13/2021) for Migraine and weight management.   Lindell Spar, MD

## 2021-07-13 NOTE — Assessment & Plan Note (Signed)
Body mass index is 34.57 kg/m.  She has been following low carb diet and portion control Exercises regularly Has tried Adipex in the past Used to take Topiramate for migraine Restarted Adipex for now

## 2021-07-13 NOTE — Assessment & Plan Note (Signed)
Care established Previous chart reviewed History and medications reviewed with the patient 

## 2021-07-13 NOTE — Assessment & Plan Note (Addendum)
Had prolonged hospital course - 2 admission in 04/2021 Hospital chart reviewed including discharge summary Now better, denies any active c/o dysuria, hematuria or flank pain Has been evaluated by Urology

## 2021-07-13 NOTE — Assessment & Plan Note (Signed)
Started Topiramate for migraine ppx Maxalt PRN for migraine flare up

## 2021-07-16 NOTE — Progress Notes (Deleted)
    GYNECOLOGY PROGRESS NOTE  Subjective:    Patient ID: Sarah Eaton, female    DOB: 02-16-1986, 35 y.o.   MRN: [id number]  HPI  Patient is a 35 y.o. female who presents for 1 month weight management follow up. She has a past history of obesity, ***. She initiated use of *** {NUMBER 1-10:22536} months ago.  Denies any undesirable side effects and reports compliance with medications.    Current interventions:  1. Diet -  2. Activity -  3. Reports bowel movements are ***.    {Common ambulatory SmartLinks:19316}  Review of Systems {ros; complete:30496}   Objective:    Vitals with BMI 07/13/2021 07/02/2021 05/16/2021  Height 5\' 2"  - 5\' 3"   Weight 189 lbs - 177 lbs 13 oz  BMI 34.56 - 31.5  Systolic 123 130  Diastolic 89 75 89  Pulse 82 108 88    General appearance: {general exam:16600} Abdomen: soft, non-tender.  Waist circumference *** in.    Labs:   Assessment:   Weight management Obesity, There is no height or weight on file to calculate BMI.  Plan:   Weight management  - doing well with weight loss, can continue current management.      , CMA [facility]

## 2021-07-17 ENCOUNTER — Encounter: Payer: Self-pay | Admitting: Obstetrics and Gynecology

## 2021-07-17 ENCOUNTER — Encounter: Payer: 59 | Admitting: Obstetrics and Gynecology

## 2021-07-26 ENCOUNTER — Telehealth: Payer: Self-pay

## 2021-07-26 LAB — CBC WITH DIFFERENTIAL/PLATELET
Basophils Absolute: 0 10*3/uL (ref 0.0–0.2)
Basos: 0 %
EOS (ABSOLUTE): 0.1 10*3/uL (ref 0.0–0.4)
Eos: 1 %
Hematocrit: 41.3 % (ref 34.0–46.6)
Hemoglobin: 13.8 g/dL (ref 11.1–15.9)
Immature Grans (Abs): 0 10*3/uL (ref 0.0–0.1)
Immature Granulocytes: 0 %
Lymphocytes Absolute: 2.9 10*3/uL (ref 0.7–3.1)
Lymphs: 34 %
MCH: 29.8 pg (ref 26.6–33.0)
MCHC: 33.4 g/dL (ref 31.5–35.7)
MCV: 89 fL (ref 79–97)
Monocytes Absolute: 0.4 10*3/uL (ref 0.1–0.9)
Monocytes: 5 %
Neutrophils Absolute: 5 10*3/uL (ref 1.4–7.0)
Neutrophils: 60 %
Platelets: 346 10*3/uL (ref 150–450)
RBC: 4.63 x10E6/uL (ref 3.77–5.28)
RDW: 12.6 % (ref 11.7–15.4)
WBC: 8.5 10*3/uL (ref 3.4–10.8)

## 2021-07-26 LAB — VITAMIN D 25 HYDROXY (VIT D DEFICIENCY, FRACTURES): Vit D, 25-Hydroxy: 41.8 ng/mL (ref 30.0–100.0)

## 2021-07-26 LAB — CMP14+EGFR
ALT: 21 IU/L (ref 0–32)
AST: 25 IU/L (ref 0–40)
Albumin/Globulin Ratio: 1.8 (ref 1.2–2.2)
Albumin: 4.4 g/dL (ref 3.8–4.8)
Alkaline Phosphatase: 55 IU/L (ref 44–121)
BUN/Creatinine Ratio: 18 (ref 9–23)
BUN: 16 mg/dL (ref 6–20)
Bilirubin Total: 0.3 mg/dL (ref 0.0–1.2)
CO2: 21 mmol/L (ref 20–29)
Calcium: 9.4 mg/dL (ref 8.7–10.2)
Chloride: 103 mmol/L (ref 96–106)
Creatinine, Ser: 0.88 mg/dL (ref 0.57–1.00)
Globulin, Total: 2.5 g/dL (ref 1.5–4.5)
Glucose: 76 mg/dL (ref 65–99)
Potassium: 4.8 mmol/L (ref 3.5–5.2)
Sodium: 139 mmol/L (ref 134–144)
Total Protein: 6.9 g/dL (ref 6.0–8.5)
eGFR: 89 mL/min/{1.73_m2} (ref >59)

## 2021-07-26 LAB — LIPID PANEL
Chol/HDL Ratio: 3.1 ratio (ref 0.0–4.4)
Cholesterol, Total: 184 mg/dL (ref 100–199)
HDL: 59 mg/dL (ref >39)
LDL Chol Calc (NIH): 112 mg/dL — ABNORMAL HIGH (ref 0–99)
Triglycerides: 67 mg/dL (ref 0–149)
VLDL Cholesterol Cal: 13 mg/dL (ref 5–40)

## 2021-07-26 LAB — HIV ANTIBODY (ROUTINE TESTING W REFLEX): HIV Screen 4th Generation wRfx: NONREACTIVE

## 2021-07-26 LAB — HEPATITIS C ANTIBODY: Hep C Virus Ab: <0.1 s/co ratio (ref 0.0–0.9)

## 2021-07-26 LAB — TSH: TSH: 1.02 u[IU]/mL (ref 0.450–4.500)

## 2021-07-26 NOTE — Telephone Encounter (Signed)
Patient left voicemail returning call about lab results.

## 2021-07-26 NOTE — Telephone Encounter (Signed)
Pt advised of lab results with verbal understanding  

## 2021-08-20 NOTE — Progress Notes (Signed)
GYNECOLOGY ANNUAL PHYSICAL EXAM PROGRESS NOTE  Subjective:    Sarah Eaton is a 35 y.o. [id number] female who presents for an annual exam. . The patient is sexually active. The patient participates in regular exercise: yes, she runs 1-2 miles daily and lifts weight sometimes. Has the patient ever been transfused or tattooed?: yes, professional tattoos. The patient reports that there is not domestic violence in her life.    The patient has the following complaints today:   She has been having some vaginal bleeding or spotting on and off x 4 months.  She reports issues with constipation.  Takes 2 Colace per day, and increased water intake but still haing issues (Miralax doesn't help, using "The Cleaner") .  If not utilizing anything, may go up to a week before having a BM.  Notes that her PCP has taken over management of her weight loss medications.   Gynecologic History:  Menarche age: 43 No LMP recorded. Patient has had an ablation. Contraception: tubal ligation History of STI's: Denies Last Pap: 04/02/2017. Results were: normal.  Denies h/o abnormal pap smears.    OB History  Gravida Para Term Preterm AB Living  2 2 2  0 0 2  SAB IAB Ectopic Multiple Live Births  0 0 0 0 2    # Outcome Date GA Lbr Len/2nd Weight Sex Delivery Anes PTL Lv  2 Term 08/05/13 [redacted]w[redacted]d  6 lb 11.8 oz (3.055 kg) M CS-LTranv Spinal  LIV     Name: Navejas,BOY Miriana     Apgar1: 8  Apgar5: 9  1 Term 01/14/10 [redacted]w[redacted]d   F CS-LTranv Spinal  LIV     Birth Comments: Failed IOL    Past Medical History:  Diagnosis Date   Hypertension    Obesity    Ovarian cyst    Pyelonephritis 05/04/2021   Sepsis due to Escherichia coli (E. coli) ([facility]) 05/04/2021    Past Surgical History:  Procedure Laterality Date   CESAREAN SECTION     CESAREAN SECTION WITH BILATERAL TUBAL LIGATION Bilateral 08/05/2013   Procedure: REPEAT CESAREAN SECTION WITH BILATERAL TUBAL LIGATION;  Surgeon: 08/07/2013, MD;  Location: [facility];   Service: Obstetrics;  Laterality: Bilateral;   ENDOMETRIAL ABLATION W/ NOVASURE  09/2014   TUBAL LIGATION      Family History  Problem Relation Age of Onset   Cancer Other        breast   Hypertension Mother    Cancer Mother        skin   Thyroid disease Mother    Cancer Maternal Grandmother    Hypertension Maternal Grandmother    Thyroid disease Father    Thyroid disease Paternal Grandmother     Social History   Socioeconomic History   Marital status: Married    Spouse name: Not on file   Number of children: Not on file   Years of education: Not on file   Highest education level: Not on file  Occupational History   Not on file  Tobacco Use   Smoking status: Never   Smokeless tobacco: Never  Vaping Use   Vaping Use: Never used  Substance and Sexual Activity   Alcohol use: Not Currently    Comment: occasionally   Drug use: Never   Sexual activity: Yes    Birth control/protection: Surgical  Other Topics Concern   Not on file  Social History Narrative   Not on file   Social Determinants of Health  Financial Resource Strain: Not on file  Food Insecurity: Not on file  Transportation Needs: Not on file  Physical Activity: Not on file  Stress: Not on file  Social Connections: Not on file  Intimate Partner Violence: Not on file    Current Outpatient Medications on File Prior to Visit  Medication Sig Dispense Refill   phentermine (ADIPEX-P) 37.5 MG tablet Take 1 tablet (37.5 mg total) by mouth daily before breakfast. 30 tablet 2   rizatriptan (MAXALT) 10 MG tablet Take 1 tablet (10 mg total) by mouth as needed for migraine. May repeat in 2 hours if needed 10 tablet 0   topiramate (TOPAMAX) 25 MG tablet Take 1 tablet (25 mg total) by mouth 2 (two) times daily. 60 tablet 2   No current facility-administered medications on file prior to visit.    Allergies  Allergen Reactions   Banana Anaphylaxis   [place] Gum [Fish Allergy] Anaphylaxis    Tuna fish only per pt      Review of Systems Constitutional: negative for chills, fatigue, fevers and sweats Eyes: negative for irritation, redness and visual disturbance Ears, nose, mouth, throat, and face: negative for hearing loss, nasal congestion, snoring and tinnitus Respiratory: negative for asthma, cough, sputum Cardiovascular: negative for chest pain, dyspnea, exertional chest pressure/discomfort, irregular heart beat, palpitations and syncope Gastrointestinal: negative for abdominal pain, nausea and vomiting. Positive for chronic constipation.  Genitourinary: positive for vaginal spotting x 4 months. Negative for genital lesions, sexual problems and vaginal discharge, dysuria and urinary incontinence Integument/breast: negative for breast lump, breast tenderness and nipple discharge Hematologic/lymphatic: negative for bleeding and easy bruising Musculoskeletal:negative for back pain and muscle weakness Neurological: negative for dizziness, headaches, vertigo and weakness Endocrine: negative for diabetic symptoms including polydipsia, polyuria and skin dryness Allergic/Immunologic: negative for hay fever and urticaria      Objective:  Blood pressure (!) 132/96, pulse 89, resp. rate 16, height 5\' 2"  (1.575 m), weight 184 lb 3.2 oz (83.6 kg). Body mass index is 33.69 kg/m.    General Appearance:    Alert, cooperative, no distress, appears stated age, mild obesity  Head:    Normocephalic, without obvious abnormality, atraumatic  Eyes:    PERRL, conjunctiva/corneas clear, EOM's intact, both eyes  Ears:    Normal external ear canals, both ears  Nose:   Nares normal, septum midline, mucosa normal, no drainage or sinus tenderness  Throat:   Lips, mucosa, and tongue normal; teeth and gums normal  Neck:   Supple, symmetrical, trachea midline, no adenopathy; thyroid: no enlargement/tenderness/nodules; no carotid bruit or JVD  Back:     Symmetric, no curvature, ROM normal, no CVA tenderness  Lungs:     Clear  to auscultation bilaterally, respirations unlabored  Chest Wall:    No tenderness or deformity   Heart:    Regular rate and rhythm, S1 and S2 normal, no murmur, rub or gallop  Breast Exam:    No tenderness, masses, or nipple abnormality  Abdomen:     Soft, non-tender, bowel sounds active all four quadrants, no masses, no organomegaly.    Genitalia:    Pelvic:external genitalia normal, vagina without lesions, discharge, or tenderness, rectovaginal septum  normal. Cervix normal in appearance, no cervical motion tenderness, no adnexal masses or tenderness.  Uterus normal size, shape, mobile, regular contours, nontender.  Rectal:    Normal external sphincter.  No hemorrhoids appreciated. Internal exam not done.   Extremities:   Extremities normal, atraumatic, no cyanosis or edema  Pulses:  2+ and symmetric all extremities  Skin:   Skin color, texture, turgor normal, no rashes or lesions  Lymph nodes:   Cervical, supraclavicular, and axillary nodes normal  Neurologic:   CNII-XII intact, normal strength, sensation and reflexes throughout   .  Labs:  Lab Results  Component Value Date   WBC 8.5 07/25/2021   HGB 13.8 07/25/2021   HCT 41.3 07/25/2021   MCV 89 07/25/2021   PLT 346 07/25/2021    Lab Results  Component Value Date   CREATININE 0.88 07/25/2021   BUN 16 07/25/2021   NA 139 07/25/2021   K 4.8 07/25/2021   CL 103 07/25/2021   CO2 21 07/25/2021    Lab Results  Component Value Date   ALT 21 07/25/2021   AST 25 07/25/2021   ALKPHOS 55 07/25/2021   BILITOT 0.3 07/25/2021    Lab Results  Component Value Date   TSH 1.020 07/25/2021     Assessment:   1. Well woman exam with routine gynecological exam   2. Cervical cancer screening   3. Obesity (BMI 30.0-34.9)   4. Chronic idiopathic constipation   5. Vaginal spotting   6. History of endometrial ablation      Plan:  - Blood tests: performed by PCP, up to date.  - Breast self exam technique reviewed and patient  encouraged to perform self-exam monthly. - Contraception: tubal ligation. - Discussed healthy lifestyle modifications. Continue weight loss management with PCP.  - Pap smear ordered. - COVID vaccination status: Declined - Flu vaccine: Declined - Constipation, discussed option of trial of Linzess as she is taking both Laxatives and stool softeners with only modest relief, symptoms return when medications are stopped.  Given samples. Can call for prescription if desired.  - Vaginal spotting may be secondary to ablation status, stress (patient mentions stress over her daughter), or lifestyle changes (weight loss, increased physical activity). Offered trial of Provera to help with spotting. May be self-limited.  - Follow up in 1 year for annual exam.  Hildred Laser, MD [facility]

## 2021-08-21 ENCOUNTER — Ambulatory Visit (INDEPENDENT_AMBULATORY_CARE_PROVIDER_SITE_OTHER): Payer: 59 | Admitting: Obstetrics and Gynecology

## 2021-08-21 ENCOUNTER — Encounter: Payer: Self-pay | Admitting: Obstetrics and Gynecology

## 2021-08-21 ENCOUNTER — Other Ambulatory Visit: Payer: Self-pay

## 2021-08-21 ENCOUNTER — Other Ambulatory Visit (HOSPITAL_COMMUNITY)
Admission: RE | Admit: 2021-08-21 | Discharge: 2021-08-21 | Disposition: A | Discharge status: Home / Self Care | Payer: 59 | Source: Ambulatory Visit | Attending: Obstetrics and Gynecology | Admitting: Obstetrics and Gynecology

## 2021-08-21 VITALS — BP 132/96 | HR 89 | Resp 16 | Ht 62.0 in | Wt 184.2 lb

## 2021-08-21 DIAGNOSIS — Z124 Encounter for screening for malignant neoplasm of cervix: Secondary | ICD-10-CM | POA: Insufficient documentation

## 2021-08-21 DIAGNOSIS — K5904 Chronic idiopathic constipation: Secondary | ICD-10-CM | POA: Diagnosis not present

## 2021-08-21 DIAGNOSIS — E669 Obesity, unspecified: Secondary | ICD-10-CM | POA: Diagnosis not present

## 2021-08-21 DIAGNOSIS — R399 Unspecified symptoms and signs involving the genitourinary system: Secondary | ICD-10-CM | POA: Diagnosis not present

## 2021-08-21 DIAGNOSIS — N939 Abnormal uterine and vaginal bleeding, unspecified: Secondary | ICD-10-CM

## 2021-08-21 DIAGNOSIS — Z9889 Other specified postprocedural states: Secondary | ICD-10-CM

## 2021-08-21 DIAGNOSIS — Z01419 Encounter for gynecological examination (general) (routine) without abnormal findings: Secondary | ICD-10-CM

## 2021-08-21 MED ORDER — MEDROXYPROGESTERONE ACETATE 10 MG PO TABS: 10.0000 mg | ORAL_TABLET | Freq: Every day | ORAL | 0 refills | Status: DC

## 2021-08-21 NOTE — Patient Instructions (Signed)
Breast Self-Awareness Breast self-awareness is knowing how your breasts look and feel. Doing breast self-awareness is important. It allows you to catch a breast problem early while it is still small and can be treated. All women should do breast self-awareness, including women who have had breast implants. Tell your doctor if you notice a change in your breasts. What you need: A mirror. A well-lit room. How to do a breast self-exam A breast self-exam is one way to learn what is normal for your breasts and to check for changes. To do a breast self-exam: Look for changes  Take off all the clothes above your waist. Stand in front of a mirror in a room with good lighting. Put your hands on your hips. Push your hands down. Look at your breasts and nipples in the mirror to see if one breast or nipple looks different from the other. Check to see if: The shape of one breast is different. The size of one breast is different. There are wrinkles, dips, and bumps in one breast and not the other. Look at each breast for changes in the skin, such as: Redness. Scaly areas. Look for changes in your nipples, such as: Liquid around the nipples. Bleeding. Dimpling. Redness. A change in where the nipples are. Feel for changes  Lie on your back on the floor. Feel each breast. To do this, follow these steps: Pick a breast to feel. Put the arm closest to that breast above your head. Use your other arm to feel the nipple area of your breast. Feel the area with the pads of your three middle fingers by making small circles with your fingers. For the first circle, press lightly. For the second circle, press harder. For the third circle, press even harder. Keep making circles with your fingers at the different pressures as you move down your breast. Stop when you feel your ribs. Move your fingers a little toward the center of your body. Start making circles with your fingers again, this time going up until  you reach your collarbone. Keep making up-and-down circles until you reach your armpit. Remember to keep using the three pressures. Feel the other breast in the same way. Sit or stand in the tub or shower. With soapy water on your skin, feel each breast the same way you did in step 2 when you were lying on the floor. Write down what you find Writing down what you find can help you remember what to tell your doctor. Write down: What is normal for each breast. Any changes you find in each breast, including: The kind of changes you find. Whether you have pain. Size and location of any lumps. When you last had your menstrual period. General tips Check your breasts every month. If you are breastfeeding, the best time to check your breasts is after you feed your baby or after you use a breast pump. If you get menstrual periods, the best time to check your breasts is 5-7 days after your menstrual period is over. With time, you will become comfortable with the self-exam, and you will begin to know if there are changes in your breasts. Contact a doctor if you: See a change in the shape or size of your breasts or nipples. See a change in the skin of your breast or nipples, such as red or scaly skin. Have fluid coming from your nipples that is not normal. Find a lump or thick area that was not there before. Have pain in   your breasts. Have any concerns about your breast health. Summary Breast self-awareness includes looking for changes in your breasts, as well as feeling for changes within your breasts. Breast self-awareness should be done in front of a mirror in a well-lit room. You should check your breasts every month. If you get menstrual periods, the best time to check your breasts is 5-7 days after your menstrual period is over. Let your doctor know of any changes you see in your breasts, including changes in size, changes on the skin, pain or tenderness, or fluid from your nipples that is not  normal. This information is not intended to replace advice given to you by your health care provider. Make sure you discuss any questions you have with your health care provider. Document Revised: 06/23/2018 Document Reviewed: 06/23/2018 Elsevier Patient Education  2022 Else[employer] Inc. Preventive Care 21-39 Years Old, Female Preventive care refers to lifestyle choices and visits with your health care provider that can promote health and wellness. This includes: A yearly physical exam. This is also called an annual wellness visit. Regular dental and eye exams. Immunizations. Screening for certain conditions. Healthy lifestyle choices, such as: Eating a healthy diet. Getting regular exercise. Not using drugs or products that contain nicotine and tobacco. Limiting alcohol use. What can I expect for my preventive care visit? Physical exam Your health care provider may check your: Height and weight. These may be used to calculate your BMI (body mass index). BMI is a measurement that tells if you are at a healthy weight. Heart rate and blood pressure. Body temperature. Skin for abnormal spots. Counseling Your health care provider may ask you questions about your: Past medical problems. Family's medical history. Alcohol, tobacco, and drug use. Emotional well-being. Home life and relationship well-being. Sexual activity. Diet, exercise, and sleep habits. Work and work environment. Access to firearms. Method of birth control. Menstrual cycle. Pregnancy history. What immunizations do I need? Vaccines are usually given at various ages, according to a schedule. Your health care provider will recommend vaccines for you based on your age, medical history, and lifestyle or other factors, such as travel or where you work. What tests do I need? Blood tests Lipid and cholesterol levels. These may be checked every 5 years starting at age 20. Hepatitis C test. Hepatitis B  test. Screening Diabetes screening. This is done by checking your blood sugar (glucose) after you have not eaten for a while (fasting). STD (sexually transmitted disease) testing, if you are at risk. BRCA-related cancer screening. This may be done if you have a family history of breast, ovarian, tubal, or peritoneal cancers. Pelvic exam and Pap test. This may be done every 3 years starting at age 21. Starting at age 30, this may be done every 5 years if you have a Pap test in combination with an HPV test. Talk with your health care provider about your test results, treatment options, and if necessary, the need for more tests. Follow these instructions at home: Eating and drinking  Eat a healthy diet that includes fresh fruits and vegetables, whole grains, lean protein, and low-fat dairy products. Take vitamin and mineral supplements as recommended by your health care provider. Do not drink alcohol if: Your health care provider tells you not to drink. You are pregnant, may be pregnant, or are planning to become pregnant. If you drink alcohol: Limit how much you have to 0-1 drink a day. Be aware of how much alcohol is in your drink. In the [place]   one drink equals one 12 oz bottle of beer (355 mL), one 5 oz glass of wine (148 mL), or one 1 oz glass of hard liquor (44 mL). Lifestyle Take daily care of your teeth and gums. Brush your teeth every morning and night with fluoride toothpaste. Floss one time each day. Stay active. Exercise for at least 30 minutes 5 or more days each week. Do not use any products that contain nicotine or tobacco, such as cigarettes, e-cigarettes, and chewing tobacco. If you need help quitting, ask your health care provider. Do not use drugs. If you are sexually active, practice safe sex. Use a condom or other form of protection to prevent STIs (sexually transmitted infections). If you do not wish to become pregnant, use a form of birth control. If you plan to become  pregnant, see your health care provider for a prepregnancy visit. Find healthy ways to cope with stress, such as: Meditation, yoga, or listening to music. Journaling. Talking to a trusted person. Spending time with friends and family. Safety Always wear your seat belt while driving or riding in a vehicle. Do not drive: If you have been drinking alcohol. Do not ride with someone who has been drinking. When you are tired or distracted. While texting. Wear a helmet and other protective equipment during sports activities. If you have firearms in your house, make sure you follow all gun safety procedures. Seek help if you have been physically or sexually abused. What's next? Go to your health care provider once a year for an annual wellness visit. Ask your health care provider how often you should have your eyes and teeth checked. Stay up to date on all vaccines. This information is not intended to replace advice given to you by your health care provider. Make sure you discuss any questions you have with your health care provider. Document Revised: 01/12/2021 Document Reviewed: 07/16/2018 Elsevier Patient Education  2022 Elsevier Inc.  

## 2021-08-22 LAB — POCT URINALYSIS DIPSTICK OB
Bilirubin, UA: NEGATIVE
Blood, UA: NEGATIVE
Glucose, UA: NEGATIVE
Ketones, UA: NEGATIVE
Leukocytes, UA: NEGATIVE
Nitrite, UA: NEGATIVE
POC,PROTEIN,UA: NEGATIVE
Spec Grav, UA: 1.01 (ref 1.010–1.025)
Urobilinogen, UA: 0.2 E.U./dL
pH, UA: 6.5 (ref 5.0–8.0)

## 2021-08-22 NOTE — Addendum Note (Signed)
Addended by: Tommie Raymond on: 08/22/2021 04:12 PM   Modules accepted: Orders

## 2021-08-24 LAB — CYTOLOGY - PAP
Comment: NEGATIVE
Diagnosis: NEGATIVE
High risk HPV: NEGATIVE

## 2021-08-27 ENCOUNTER — Other Ambulatory Visit: Payer: Self-pay | Admitting: Obstetrics and Gynecology

## 2021-08-27 ENCOUNTER — Other Ambulatory Visit: Payer: Self-pay

## 2021-09-04 ENCOUNTER — Telehealth: Payer: Self-pay | Admitting: Obstetrics and Gynecology

## 2021-09-04 NOTE — Telephone Encounter (Signed)
Fax Received:  [facility]   Pt requests new Rx Linzess , not over the counter  Fax placed in Dr.Cherry's Box

## 2021-09-07 ENCOUNTER — Other Ambulatory Visit: Payer: Self-pay

## 2021-09-07 MED ORDER — LINACLOTIDE 72 MCG PO CAPS: 72.0000 ug | ORAL_CAPSULE | Freq: Every day | ORAL | 11 refills | Status: DC

## 2021-09-15 ENCOUNTER — Other Ambulatory Visit: Payer: Self-pay | Admitting: Internal Medicine

## 2021-09-15 DIAGNOSIS — G43009 Migraine without aura, not intractable, without status migrainosus: Secondary | ICD-10-CM

## 2021-09-20 ENCOUNTER — Telehealth: Payer: Self-pay

## 2021-09-20 NOTE — Telephone Encounter (Signed)
Patient called wanting to drop off a urine sample. She advised she was having lower back pain on her left side. She advised previously when she experienced these symptoms there were issues with her kidneys. She was advised she was unable to make it today but would come Monday. She advised if her symptoms or pain increased she would go to the Emergency Department.

## 2021-09-24 ENCOUNTER — Other Ambulatory Visit: Payer: 59

## 2021-09-24 ENCOUNTER — Other Ambulatory Visit: Payer: Self-pay

## 2021-09-24 DIAGNOSIS — N12 Tubulo-interstitial nephritis, not specified as acute or chronic: Secondary | ICD-10-CM

## 2021-09-24 LAB — URINALYSIS, ROUTINE W REFLEX MICROSCOPIC
Bilirubin, UA: NEGATIVE
Glucose, UA: NEGATIVE
Ketones, UA: NEGATIVE
Leukocytes,UA: NEGATIVE
Nitrite, UA: NEGATIVE
Protein,UA: NEGATIVE
RBC, UA: NEGATIVE
Specific Gravity, UA: 1.02 (ref 1.005–1.030)
Urobilinogen, Ur: 0.2 mg/dL (ref 0.2–1.0)
pH, UA: 7 (ref 5.0–7.5)

## 2021-09-26 LAB — URINE CULTURE

## 2021-10-07 ENCOUNTER — Other Ambulatory Visit: Payer: Self-pay | Admitting: Internal Medicine

## 2021-10-07 DIAGNOSIS — G43009 Migraine without aura, not intractable, without status migrainosus: Secondary | ICD-10-CM

## 2021-10-19 ENCOUNTER — Ambulatory Visit: Payer: 59 | Admitting: Internal Medicine

## 2021-10-23 ENCOUNTER — Other Ambulatory Visit: Payer: Self-pay

## 2021-10-23 ENCOUNTER — Encounter: Payer: Self-pay | Admitting: Internal Medicine

## 2021-10-23 ENCOUNTER — Ambulatory Visit (INDEPENDENT_AMBULATORY_CARE_PROVIDER_SITE_OTHER): Payer: 59 | Admitting: Internal Medicine

## 2021-10-23 VITALS — Wt 176.0 lb

## 2021-10-23 DIAGNOSIS — G43009 Migraine without aura, not intractable, without status migrainosus: Secondary | ICD-10-CM

## 2021-10-23 DIAGNOSIS — E669 Obesity, unspecified: Secondary | ICD-10-CM

## 2021-10-23 MED ORDER — PHENTERMINE HCL 37.5 MG PO TABS: 37.5000 mg | ORAL_TABLET | Freq: Every day | ORAL | 2 refills | Status: DC

## 2021-10-23 NOTE — Assessment & Plan Note (Signed)
Body mass index is 32.19 kg/m.  She has been following low carb diet and portion control Exercises regularly Has tried Adipex in the past Used to take Topiramate for migraine Continue Adipex for now

## 2021-10-23 NOTE — Progress Notes (Signed)
Virtual Visit via Telephone Note   This visit type was conducted due to national recommendations for restrictions regarding the COVID-19 Pandemic (e.g. social distancing) in an effort to limit this patient's exposure and mitigate transmission in our community.  Due to her co-morbid illnesses, this patient is at least at moderate risk for complications without adequate follow up.  This format is felt to be most appropriate for this patient at this time.  The patient did not have access to video technology/had technical difficulties with video requiring transitioning to audio format only (telephone).  All issues noted in this document were discussed and addressed.  No physical exam could be performed with this format.   Evaluation Performed:  Follow-up visit  Date:  10/23/2021   ID:  Sarah Eaton, DOB [birth year], MRN [id number]  Patient Location: Home Provider Location: Office/Clinic  Participants: Patient Location of Patient: Home Location of Provider: Telehealth Consent was obtain for visit to be over via telehealth. I verified that I am speaking with the correct person using two identifiers.  PCP:  Anabel Halon, MD   Chief Complaint: Weight management  History of Present Illness:    Sarah Eaton is a 35 y.o. female who has a televisit for follow-up of migraine and obesity.  She has lost about 13 pounds since the last visit.  She has been trying to follow low-carb diet.  She has started increasing portion of the green vegetables in her diet.  She has been tolerating phentermine well.  Denies any chest pain, dyspnea or palpitations.  She has started taking Topamax for migraine.  Her headache frequency has been less now.  Denies any nausea or vomiting currently.   The patient does not have symptoms concerning for COVID-19 infection (fever, chills, cough, or new shortness of breath).   Past Medical, Surgical, Social History, Allergies, and Medications have been  Reviewed.  Past Medical History:  Diagnosis Date   Hypertension    Obesity    Ovarian cyst    Pyelonephritis 05/04/2021   Sepsis due to Escherichia coli (E. coli) ([facility]) 05/04/2021   Past Surgical History:  Procedure Laterality Date   CESAREAN SECTION     CESAREAN SECTION WITH BILATERAL TUBAL LIGATION Bilateral 08/05/2013   Procedure: REPEAT CESAREAN SECTION WITH BILATERAL TUBAL LIGATION;  Surgeon: Allie Bossier, MD;  Location: [facility];  Service: Obstetrics;  Laterality: Bilateral;   ENDOMETRIAL ABLATION W/ NOVASURE  09/2014   TUBAL LIGATION       Current Meds  Medication Sig   linaclotide (LINZESS) 72 MCG capsule Take 1 capsule (72 mcg total) by mouth daily before breakfast.   medroxyPROGESTERone (PROVERA) 10 MG tablet Take 1 tablet (10 mg total) by mouth daily. Use for ten days   phentermine (ADIPEX-P) 37.5 MG tablet Take 1 tablet (37.5 mg total) by mouth daily before breakfast.   rizatriptan (MAXALT) 10 MG tablet TAKE 1 TABLET BY MOUTH AS NEEDED FOR MIGRAINE. MAY REPEAT IN 2 HOURS IF NEEDED   topiramate (TOPAMAX) 25 MG tablet TAKE 1 TABLET BY MOUTH TWICE A DAY     Allergies:   Banana and Tuna [fish allergy]   ROS:   Please see the history of present illness.     All other systems reviewed and are negative.   Labs/Other Tests and Data Reviewed:    Recent Labs: 07/25/2021: ALT 21; BUN 16; Creatinine, Ser 0.88; Hemoglobin 13.8; Platelets 346; Potassium 4.8; Sodium 139; TSH 1.020   Recent Lipid Panel Lab  Results  Component Value Date/Time   CHOL 184 07/25/2021 08:43 AM   TRIG 67 07/25/2021 08:43 AM   HDL 59 07/25/2021 08:43 AM   CHOLHDL 3.1 07/25/2021 08:43 AM   LDLCALC 112 (H) 07/25/2021 08:43 AM    Wt Readings from Last 3 Encounters:  10/23/21 176 lb (79.8 kg)  08/21/21 184 lb 3.2 oz (83.6 kg)  07/13/21 189 lb (85.7 kg)     ASSESSMENT & PLAN:    Obesity (BMI 30-39.9) Body mass index is 32.19 kg/m.  She has been following low carb diet and portion  control Exercises regularly Has tried Adipex in the past Used to take Topiramate for migraine Continue Adipex for now  Migraine On Topiramate, better now Maxalt PRN for migraine flare up   Time:   Today, I have spent 13 minutes reviewing the chart, including problem list, medications, and with the patient with telehealth technology discussing the above problems.   Medication Adjustments/Labs and Tests Ordered: Current medicines are reviewed at length with the patient today.  Concerns regarding medicines are outlined above.   Tests Ordered: No orders of the defined types were placed in this encounter.   Medication Changes: No orders of the defined types were placed in this encounter.    Note: This dictation was prepared with Dragon dictation along with smaller phrase technology. Similar sounding words can be transcribed inadequately or may not be corrected upon review. Any transcriptional errors that result from this process are unintentional.      Disposition:  Follow up  Signed, Anabel Halon, MD  10/23/2021 3:17 PM     [facility]

## 2021-10-23 NOTE — Patient Instructions (Signed)
Please continue taking Phentermine as prescribed.  Please continue to follow low carb diet and perform moderate exercise/walking at least 150 mins/week.

## 2021-10-23 NOTE — Assessment & Plan Note (Signed)
On Topiramate, better now Maxalt PRN for migraine flare up 

## 2021-11-06 ENCOUNTER — Telehealth: Payer: Self-pay | Admitting: Urology

## 2021-11-06 NOTE — Telephone Encounter (Signed)
Called and spoke with pt she is willing come to do a urine drop off and possible culture .Pt will be contacted with results

## 2021-11-06 NOTE — Telephone Encounter (Signed)
Patient called the office this morning to report intermittent pain on her lower left side of her abdomen and back.  She reports that the pain is a 3 on a pain scale of 10.  She does not report any other symptoms.  The pain started this morning.  Patient is looking for advice if she needs to be seen, provide a urine specimen or get an xray.  She has seen Dr. Mena Goes in the past.  Please advise.

## 2021-11-07 ENCOUNTER — Other Ambulatory Visit: Payer: Self-pay

## 2021-11-07 ENCOUNTER — Other Ambulatory Visit: Payer: 59

## 2021-11-07 ENCOUNTER — Encounter: Payer: Self-pay | Admitting: Internal Medicine

## 2021-11-07 DIAGNOSIS — N12 Tubulo-interstitial nephritis, not specified as acute or chronic: Secondary | ICD-10-CM

## 2021-11-07 LAB — URINALYSIS, ROUTINE W REFLEX MICROSCOPIC
Bilirubin, UA: NEGATIVE
Glucose, UA: NEGATIVE
Ketones, UA: NEGATIVE
Leukocytes,UA: NEGATIVE
Nitrite, UA: NEGATIVE
RBC, UA: NEGATIVE
Specific Gravity, UA: >1.03 — ABNORMAL HIGH (ref 1.005–1.030)
Urobilinogen, Ur: 0.2 mg/dL (ref 0.2–1.0)
pH, UA: 6 (ref 5.0–7.5)

## 2021-11-07 NOTE — Telephone Encounter (Signed)
Urine culture noted to be negative.  Symptoms went over with Dr. Ronne Binning and he advised patient see PCP. Patient called and made aware.

## 2021-11-11 ENCOUNTER — Emergency Department (HOSPITAL_COMMUNITY): Payer: 59

## 2021-11-11 ENCOUNTER — Emergency Department (HOSPITAL_COMMUNITY)
Admission: EM | Admit: 2021-11-11 | Discharge: 2021-11-12 | Discharge status: Against Medical Advice | Payer: 59 | Attending: Emergency Medicine | Admitting: Emergency Medicine

## 2021-11-11 ENCOUNTER — Other Ambulatory Visit: Payer: Self-pay

## 2021-11-11 DIAGNOSIS — Z5321 Procedure and treatment not carried out due to patient leaving prior to being seen by health care provider: Secondary | ICD-10-CM | POA: Diagnosis not present

## 2021-11-11 DIAGNOSIS — N119 Chronic tubulo-interstitial nephritis, unspecified: Secondary | ICD-10-CM | POA: Diagnosis not present

## 2021-11-11 DIAGNOSIS — R1032 Left lower quadrant pain: Secondary | ICD-10-CM | POA: Diagnosis present

## 2021-11-11 LAB — CBC WITH DIFFERENTIAL/PLATELET
Abs Immature Granulocytes: 0.03 10*3/uL (ref 0.00–0.07)
Basophils Absolute: 0.1 10*3/uL (ref 0.0–0.1)
Basophils Relative: 1 %
Eosinophils Absolute: 0.2 10*3/uL (ref 0.0–0.5)
Eosinophils Relative: 2 %
HCT: 43.1 % (ref 36.0–46.0)
Hemoglobin: 14.3 g/dL (ref 12.0–15.0)
Immature Granulocytes: 0 %
Lymphocytes Relative: 42 %
Lymphs Abs: 3.9 10*3/uL (ref 0.7–4.0)
MCH: 30.4 pg (ref 26.0–34.0)
MCHC: 33.2 g/dL (ref 30.0–36.0)
MCV: 91.7 fL (ref 80.0–100.0)
Monocytes Absolute: 0.5 10*3/uL (ref 0.1–1.0)
Monocytes Relative: 6 %
Neutro Abs: 4.7 10*3/uL (ref 1.7–7.7)
Neutrophils Relative %: 49 %
Platelets: 342 10*3/uL (ref 150–400)
RBC: 4.7 MIL/uL (ref 3.87–5.11)
RDW: 13.2 % (ref 11.5–15.5)
WBC: 9.5 10*3/uL (ref 4.0–10.5)
nRBC: 0 % (ref 0.0–0.2)

## 2021-11-11 LAB — COMPREHENSIVE METABOLIC PANEL
ALT: 20 U/L (ref 0–44)
AST: 14 U/L — ABNORMAL LOW (ref 15–41)
Albumin: 4 g/dL (ref 3.5–5.0)
Alkaline Phosphatase: 45 U/L (ref 38–126)
Anion gap: 7 (ref 5–15)
BUN: 16 mg/dL (ref 6–20)
CO2: 25 mmol/L (ref 22–32)
Calcium: 9.5 mg/dL (ref 8.9–10.3)
Chloride: 105 mmol/L (ref 98–111)
Creatinine, Ser: 0.87 mg/dL (ref 0.44–1.00)
GFR, Estimated: >60 mL/min (ref >60)
Glucose, Bld: 92 mg/dL (ref 70–99)
Potassium: 4.3 mmol/L (ref 3.5–5.1)
Sodium: 137 mmol/L (ref 135–145)
Total Bilirubin: 0.4 mg/dL (ref 0.3–1.2)
Total Protein: 6.9 g/dL (ref 6.5–8.1)

## 2021-11-11 LAB — URINALYSIS, ROUTINE W REFLEX MICROSCOPIC
Bilirubin Urine: NEGATIVE
Glucose, UA: NEGATIVE mg/dL
Hgb urine dipstick: NEGATIVE
Ketones, ur: NEGATIVE mg/dL
Leukocytes,Ua: NEGATIVE
Nitrite: NEGATIVE
Protein, ur: NEGATIVE mg/dL
Specific Gravity, Urine: >1.03 — ABNORMAL HIGH (ref 1.005–1.030)
pH: 6 (ref 5.0–8.0)

## 2021-11-11 LAB — PREGNANCY, URINE: Preg Test, Ur: NEGATIVE

## 2021-11-11 NOTE — ED Provider Notes (Signed)
Emergency Medicine Provider Triage Evaluation Note  Sarah Eaton , a 35 y.o. female  was evaluated in triage.  Pt complains of left-sided flank pain that started about 2 days ago and is worsening since onset.  Patient has a history of urosepsis and was hospitalized in June, and she is concerned as this feels like the early stages of the last time she had pyelonephritis.  She has no history of kidney stones.  She denies fever, chills.  No vomiting or diarrhea.  No urinary symptoms  Review of Systems  Positive: Left-sided flank pain Negative: Dysuria, hematuria, abdominal pain, fevers, chills, constipation, diarrhea and chest pain  Physical Exam  BP (!) 133/92 (BP Location: Left Arm)    Pulse 85    Temp 98.9 F (37.2 C) (Oral)    Resp 16    Ht 5\' 2"  (1.575 m)    Wt 79.8 kg    SpO2 100%    BMI 32.19 kg/m  Gen:   Awake, no distress   Resp:  Normal effort  MSK:   Moves extremities without difficulty  Other:  Abdomen soft nondistended nontender to palpation.  Positive left-sided CVA tenderness  Medical Decision Making  Medically screening exam initiated at 8:22 PM.  Appropriate orders placed.  ARORA COAKLEY was informed that the remainder of the evaluation will be completed by another provider, this initial triage assessment does not replace that evaluation, and the importance of remaining in the ED until their evaluation is complete.  Given patient's known history of pyelonephritis will obtain ultrasound here in triage as well as labs.   Alease Medina, Janell Quiet 11/11/21 2023    2024, MD 11/11/21 2029

## 2021-11-11 NOTE — ED Triage Notes (Signed)
Pt c/o lower back pain and left side flank pain. Pt also states she feels like she is dehydrated.

## 2021-11-12 NOTE — ED Notes (Signed)
Pt called for vitals multiple times, no response 

## 2021-12-19 ENCOUNTER — Ambulatory Visit (INDEPENDENT_AMBULATORY_CARE_PROVIDER_SITE_OTHER): Payer: 59 | Admitting: Obstetrics and Gynecology

## 2021-12-19 ENCOUNTER — Encounter: Payer: Self-pay | Admitting: Obstetrics and Gynecology

## 2021-12-19 ENCOUNTER — Other Ambulatory Visit: Payer: Self-pay

## 2021-12-19 VITALS — Temp 97.6°F

## 2021-12-19 DIAGNOSIS — R350 Frequency of micturition: Secondary | ICD-10-CM | POA: Diagnosis not present

## 2021-12-19 LAB — POCT URINALYSIS DIPSTICK
Bilirubin, UA: NEGATIVE
Glucose, UA: NEGATIVE
Ketones, UA: NEGATIVE
Nitrite, UA: NEGATIVE
Protein, UA: POSITIVE — AB
Spec Grav, UA: 1.02 (ref 1.010–1.025)
Urobilinogen, UA: 0.2 E.U./dL
pH, UA: 6 (ref 5.0–8.0)

## 2021-12-19 MED ORDER — SULFAMETHOXAZOLE-TRIMETHOPRIM 800-160 MG PO TABS: 1.0000 | ORAL_TABLET | Freq: Two times a day (BID) | ORAL | 0 refills | Status: DC

## 2021-12-19 NOTE — Progress Notes (Signed)
Patient sent a message this morning stating that she was having urinary frequency, urgency and odor. She has been having some left side abdominal cramping. She has treated herself with AZO and cranberry juice. Patient was advised to come in for urine drop off and to follow UTI protocol after drop off.

## 2021-12-22 LAB — URINE CULTURE

## 2021-12-24 ENCOUNTER — Ambulatory Visit (INDEPENDENT_AMBULATORY_CARE_PROVIDER_SITE_OTHER): Payer: 59 | Admitting: Internal Medicine

## 2021-12-24 ENCOUNTER — Encounter: Payer: Self-pay | Admitting: Internal Medicine

## 2021-12-24 ENCOUNTER — Encounter: Payer: Self-pay | Admitting: Obstetrics and Gynecology

## 2021-12-24 ENCOUNTER — Other Ambulatory Visit: Payer: Self-pay

## 2021-12-24 DIAGNOSIS — N390 Urinary tract infection, site not specified: Secondary | ICD-10-CM | POA: Diagnosis not present

## 2021-12-24 DIAGNOSIS — Z87448 Personal history of other diseases of urinary system: Secondary | ICD-10-CM | POA: Diagnosis not present

## 2021-12-24 MED ORDER — AMOXICILLIN-POT CLAVULANATE 875-125 MG PO TABS: 1.0000 | ORAL_TABLET | Freq: Two times a day (BID) | ORAL | 1 refills | Status: AC

## 2021-12-24 MED ORDER — SULFAMETHOXAZOLE-TRIMETHOPRIM 800-160 MG PO TABS: 1.0000 | ORAL_TABLET | Freq: Two times a day (BID) | ORAL | 0 refills | Status: DC

## 2021-12-24 NOTE — Addendum Note (Signed)
Addended by: Fabian November on: 12/24/2021 09:59 AM   Modules accepted: Orders

## 2021-12-24 NOTE — Progress Notes (Signed)
Virtual Visit via Telephone Note   This visit type was conducted due to national recommendations for restrictions regarding the COVID-19 Pandemic (e.g. social distancing) in an effort to limit this patient's exposure and mitigate transmission in our community.  Due to her co-morbid illnesses, this patient is at least at moderate risk for complications without adequate follow up.  This format is felt to be most appropriate for this patient at this time.  The patient did not have access to video technology/had technical difficulties with video requiring transitioning to audio format only (telephone).  All issues noted in this document were discussed and addressed.  No physical exam could be performed with this format.  Evaluation Performed:  Follow-up visit  Date:  12/24/2021   ID:  Sarah Eaton, DOB [birth year], MRN [id number]  Patient Location: Home Provider Location: Office/Clinic  Participants: Patient Location of Patient: Home Location of Provider: Telehealth Consent was obtain for visit to be over via telehealth. I verified that I am speaking with the correct person using two identifiers.  PCP:  Lindell Spar, MD   Chief Complaint: Left-sided flank pain and urinary frequency  History of Present Illness:    Sarah Eaton is a 36 y.o. female who has a televisit for complaint of left-sided flank pain and urinary frequency for about a week now.  She denies any fever, chills, nausea or vomiting currently.  She has a history of urosepsis secondary to pyelonephritis in the past.  She was recently seen by OB/GYN for UTI and was given Bactrim for 7 days and she has 1 more day of it.  She continues to have similar symptoms yet.  Her urine culture showed E. coli.  She currently denies dysuria or hematuria.  The patient does not have symptoms concerning for COVID-19 infection (fever, chills, cough, or new shortness of breath).   Past Medical, Surgical, Social History, Allergies, and  Medications have been Reviewed.  Past Medical History:  Diagnosis Date   Hypertension    Obesity    Ovarian cyst    Pyelonephritis 05/04/2021   Sepsis due to Escherichia coli (E. coli) ([facility]) 05/04/2021   Past Surgical History:  Procedure Laterality Date   CESAREAN SECTION     CESAREAN SECTION WITH BILATERAL TUBAL LIGATION Bilateral 08/05/2013   Procedure: REPEAT CESAREAN SECTION WITH BILATERAL TUBAL LIGATION;  Surgeon: Emily Filbert, MD;  Location: [facility];  Service: Obstetrics;  Laterality: Bilateral;   ENDOMETRIAL ABLATION W/ NOVASURE  09/2014   TUBAL LIGATION       Current Meds  Medication Sig   linaclotide (LINZESS) 72 MCG capsule Take 1 capsule (72 mcg total) by mouth daily before breakfast.   medroxyPROGESTERone (PROVERA) 10 MG tablet Take 1 tablet (10 mg total) by mouth daily. Use for ten days   phentermine (ADIPEX-P) 37.5 MG tablet Take 1 tablet (37.5 mg total) by mouth daily before breakfast.   rizatriptan (MAXALT) 10 MG tablet TAKE 1 TABLET BY MOUTH AS NEEDED FOR MIGRAINE. MAY REPEAT IN 2 HOURS IF NEEDED   sulfamethoxazole-trimethoprim (BACTRIM DS) 800-160 MG tablet Take 1 tablet by mouth 2 (two) times daily.   topiramate (TOPAMAX) 25 MG tablet TAKE 1 TABLET BY MOUTH TWICE A DAY     Allergies:   Banana and Tuna [fish allergy]   ROS:   Please see the history of present illness.     All other systems reviewed and are negative.   Labs/Other Tests and Data Reviewed:    Recent  Labs: 07/25/2021: TSH 1.020 11/11/2021: ALT 20; BUN 16; Creatinine, Ser 0.87; Hemoglobin 14.3; Platelets 342; Potassium 4.3; Sodium 137   Recent Lipid Panel Lab Results  Component Value Date/Time   CHOL 184 07/25/2021 08:43 AM   TRIG 67 07/25/2021 08:43 AM   HDL 59 07/25/2021 08:43 AM   CHOLHDL 3.1 07/25/2021 08:43 AM   LDLCALC 112 (H) 07/25/2021 08:43 AM    Wt Readings from Last 3 Encounters:  11/11/21 176 lb (79.8 kg)  10/23/21 176 lb (79.8 kg)  08/21/21 184 lb 3.2 oz (83.6 kg)      ASSESSMENT & PLAN:    UTI History of urosepsis secondary to pyelonephritis UA and urine culture reviewed Will continue Bactrim for 1 more week to cover for possible pyelonephritis If persistent symptoms, will check US renal  Time:   Today, I have spent 9 minutes reviewing the chart, including problem list, medications, and with the patient with telehealth technology discussing the above problems.   Medication Adjustments/Labs and Tests Ordered: Current medicines are reviewed at length with the patient today.  Concerns regarding medicines are outlined above.   Tests Ordered: No orders of the defined types were placed in this encounter.   Medication Changes: Meds ordered this encounter  Medications   sulfamethoxazole-trimethoprim (BACTRIM DS) 800-160 MG tablet    Sig: Take 1 tablet by mouth 2 (two) times daily.    Dispense:  14 tablet    Refill:  0     Note: This dictation was prepared with Dragon dictation along with smaller phrase technology. Similar sounding words can be transcribed inadequately or may not be corrected upon review. Any transcriptional errors that result from this process are unintentional.      Disposition:  Follow up  Signed, Lindell Spar, MD  12/24/2021 12:15 PM     [place]

## 2022-01-01 ENCOUNTER — Telehealth: Payer: Self-pay

## 2022-01-01 ENCOUNTER — Encounter: Payer: Self-pay | Admitting: Internal Medicine

## 2022-01-01 NOTE — Telephone Encounter (Signed)
Patient coming in Monday at 2:00 for appt with Dr. Junious Silk. She is on her 3rd week of antibiotics for a UTI. Patients primary wanted to know if we would do an x-ray before the appt.

## 2022-01-01 NOTE — Telephone Encounter (Signed)
Please advise 

## 2022-01-01 NOTE — Telephone Encounter (Signed)
Pt made appt 01-02-22

## 2022-01-01 NOTE — Telephone Encounter (Signed)
Patient called and notified of Dr. Mena Goes advice. Voiced understanding

## 2022-01-02 ENCOUNTER — Ambulatory Visit (INDEPENDENT_AMBULATORY_CARE_PROVIDER_SITE_OTHER): Payer: 59 | Admitting: Internal Medicine

## 2022-01-02 ENCOUNTER — Other Ambulatory Visit: Payer: Self-pay

## 2022-01-02 ENCOUNTER — Encounter: Payer: Self-pay | Admitting: Internal Medicine

## 2022-01-02 VITALS — BP 132/86 | HR 101 | Resp 18 | Ht 62.0 in | Wt 171.0 lb

## 2022-01-02 DIAGNOSIS — K5904 Chronic idiopathic constipation: Secondary | ICD-10-CM | POA: Diagnosis not present

## 2022-01-02 DIAGNOSIS — R1032 Left lower quadrant pain: Secondary | ICD-10-CM | POA: Diagnosis not present

## 2022-01-02 MED ORDER — LUBIPROSTONE 24 MCG PO CAPS: 24.0000 ug | ORAL_CAPSULE | Freq: Two times a day (BID) | ORAL | 2 refills | Status: DC

## 2022-01-02 NOTE — Assessment & Plan Note (Signed)
Unclear etiology, less likely to be pyelonephritis as she has been on antibiotic for the last 3 weeks and negative CVA tenderness with no urinary symptoms Check CT abdomen pelvis for colitis

## 2022-01-02 NOTE — Assessment & Plan Note (Signed)
Has tried Colace, senna, MiraLAX and Dulcolax in the past Had responded well to [place], but not covered by her insurance now Will try Amitiza, will need prior authorization Will refer to GI if persistent concern

## 2022-01-02 NOTE — Progress Notes (Signed)
Acute Office Visit  Subjective:    Patient ID: Sarah Eaton, female    DOB: 1985/12/10, 36 y.o.   MRN: [id number]  Chief Complaint  Patient presents with   Acute Visit    Left side pain has been going on for about 3 weeks has also been on antibiotics pt has been running low grade fever on and off also    HPI Patient is in today for complaint of LLQ abdominal pain and pelvic pain, which is intermittent for the last 3 weeks.  She states that it is similar to the pain that she had when she had pyelonephritis.  She also reports intermittent fever.  She was initially treated with Bactrim for UTI, which was later switched to Augmentin due to antibiotic resistance noted on urine culture.  She is currently taking Augmentin.  She denies any dysuria or hematuria currently.  Denies any history of nephrolithiasis.  Of note, she has history of chronic constipation, for which she used to take Linzess.  She has not been able to take it recently due to cost concern.  She has tried Colace, senna, MiraLAX, Dulcolax amongst other OTC products for constipation with no relief.  She currently is taking Colace for it.  She denies any melena or hematochezia currently.  Past Medical History:  Diagnosis Date   Hypertension    Obesity    Ovarian cyst    Pyelonephritis 05/04/2021   Sepsis due to Escherichia coli (E. coli) ([place]) 05/04/2021    Past Surgical History:  Procedure Laterality Date   CESAREAN SECTION     CESAREAN SECTION WITH BILATERAL TUBAL LIGATION Bilateral 08/05/2013   Procedure: REPEAT CESAREAN SECTION WITH BILATERAL TUBAL LIGATION;  Surgeon: Emily Filbert, MD;  Location: [facility];  Service: Obstetrics;  Laterality: Bilateral;   ENDOMETRIAL ABLATION W/ NOVASURE  09/2014   TUBAL LIGATION      Family History  Problem Relation Age of Onset   Cancer Other        breast   Hypertension Mother    Cancer Mother        skin   Thyroid disease Mother    Cancer Maternal Grandmother    Hypertension  Maternal Grandmother    Thyroid disease Father    Thyroid disease Paternal Grandmother     Social History   Socioeconomic History   Marital status: Married    Spouse name: Not on file   Number of children: Not on file   Years of education: Not on file   Highest education level: Not on file  Occupational History   Not on file  Tobacco Use   Smoking status: Never   Smokeless tobacco: Never  Vaping Use   Vaping Use: Never used  Substance and Sexual Activity   Alcohol use: Not Currently    Comment: occasionally   Drug use: Never   Sexual activity: Yes    Birth control/protection: Surgical  Other Topics Concern   Not on file  Social History Narrative   Not on file   Social Determinants of Health   Financial Resource Strain: Not on file  Food Insecurity: Not on file  Transportation Needs: Not on file  Physical Activity: Not on file  Stress: Not on file  Social Connections: Not on file  Intimate Partner Violence: Not on file    Outpatient Medications Prior to Visit  Medication Sig Dispense Refill   amoxicillin-clavulanate (AUGMENTIN) 875-125 MG tablet Take 1 tablet by mouth 2 (two) times daily for  10 days. 20 tablet 1   linaclotide (LINZESS) 72 MCG capsule Take 1 capsule (72 mcg total) by mouth daily before breakfast. 30 capsule 11   medroxyPROGESTERone (PROVERA) 10 MG tablet Take 1 tablet (10 mg total) by mouth daily. Use for ten days 10 tablet 0   phentermine (ADIPEX-P) 37.5 MG tablet Take 1 tablet (37.5 mg total) by mouth daily before breakfast. 30 tablet 2   rizatriptan (MAXALT) 10 MG tablet TAKE 1 TABLET BY MOUTH AS NEEDED FOR MIGRAINE. MAY REPEAT IN 2 HOURS IF NEEDED 4 tablet 2   topiramate (TOPAMAX) 25 MG tablet TAKE 1 TABLET BY MOUTH TWICE A DAY 180 tablet 0   sulfamethoxazole-trimethoprim (BACTRIM DS) 800-160 MG tablet Take 1 tablet by mouth 2 (two) times daily. (Patient not taking: Reported on 01/02/2022) 14 tablet 0   No facility-administered medications prior  to visit.    Allergies  Allergen Reactions   Banana Anaphylaxis   Geralyn Flash [Fish Allergy] Anaphylaxis    Tuna fish only per pt    Review of Systems  Constitutional:  Negative for chills and fever.  HENT:  Negative for congestion, sinus pressure, sinus pain and sore throat.   Eyes:  Negative for pain and discharge.  Respiratory:  Negative for cough and shortness of breath.   Cardiovascular:  Negative for chest pain and palpitations.  Gastrointestinal:  Positive for abdominal pain and constipation. Negative for diarrhea, nausea and vomiting.  Endocrine: Negative for polydipsia and polyuria.  Genitourinary:  Negative for dysuria and hematuria.  Musculoskeletal:  Negative for neck pain and neck stiffness.  Skin:  Negative for rash.  Neurological:  Positive for headaches. Negative for dizziness and weakness.  Psychiatric/Behavioral:  Negative for agitation and behavioral problems. The patient is nervous/anxious.       Objective:    Physical Exam Vitals reviewed.  Constitutional:      General: She is not in acute distress.    Appearance: She is obese. She is not diaphoretic.  HENT:     Head: Normocephalic and atraumatic.     Nose: Nose normal.     Mouth/Throat:     Mouth: Mucous membranes are moist.  Eyes:     General: No scleral icterus.    Extraocular Movements: Extraocular movements intact.  Cardiovascular:     Rate and Rhythm: Normal rate and regular rhythm.     Pulses: Normal pulses.     Heart sounds: Normal heart sounds. No murmur heard. Pulmonary:     Breath sounds: Normal breath sounds. No wheezing or rales.  Abdominal:     Palpations: Abdomen is soft.     Tenderness: There is abdominal tenderness (Mild, LLQ). There is no right CVA tenderness or left CVA tenderness.  Musculoskeletal:     Cervical back: Neck supple. No tenderness.     Right lower leg: No edema.     Left lower leg: No edema.  Skin:    General: Skin is warm.     Findings: No rash.  Neurological:      General: No focal deficit present.     Mental Status: She is alert and oriented to person, place, and time.     Sensory: No sensory deficit.     Motor: No weakness.  Psychiatric:        Mood and Affect: Mood normal.        Behavior: Behavior normal.    BP 132/86 (BP Location: Right Arm, Patient Position: Sitting, Cuff Size: Normal)    Pulse (!) 101  Resp 18    Ht 5\' 2"  (1.575 m)    Wt 171 lb (77.6 kg)    SpO2 100%    BMI 31.28 kg/m  Wt Readings from Last 3 Encounters:  01/02/22 171 lb (77.6 kg)  11/11/21 176 lb (79.8 kg)  10/23/21 176 lb (79.8 kg)        Assessment & Plan:   Problem List Items Addressed This Visit       Digestive   Chronic idiopathic constipation    Has tried Colace, senna, MiraLAX and Dulcolax in the past Had responded well to [place], but not covered by her insurance now Will try Amitiza, will need prior authorization Will refer to GI if persistent concern      Relevant Medications   lubiprostone (AMITIZA) 24 MCG capsule     Other   Left lower quadrant abdominal pain - Primary    Unclear etiology, less likely to be pyelonephritis as she has been on antibiotic for the last 3 weeks and negative CVA tenderness with no urinary symptoms Check CT abdomen pelvis for colitis      Relevant Orders   CT Abdomen Pelvis Wo Contrast     Meds ordered this encounter  Medications   lubiprostone (AMITIZA) 24 MCG capsule    Sig: Take 1 capsule (24 mcg total) by mouth 2 (two) times daily with a meal.    Dispense:  60 capsule    Refill:  2     Mackensey Bolte Keith Rake, MD

## 2022-01-02 NOTE — Patient Instructions (Signed)
Please start taking Amitiza as prescribed.  Please continue to use Colace meanwhile.  You are being scheduled to get CT abdomen done at [facility]

## 2022-01-04 ENCOUNTER — Other Ambulatory Visit: Payer: Self-pay

## 2022-01-04 ENCOUNTER — Encounter (HOSPITAL_COMMUNITY): Payer: Self-pay

## 2022-01-04 ENCOUNTER — Ambulatory Visit (HOSPITAL_COMMUNITY)
Admission: RE | Admit: 2022-01-04 | Discharge: 2022-01-04 | Disposition: A | Discharge status: Home / Self Care | Payer: 59 | Source: Ambulatory Visit | Attending: Internal Medicine | Admitting: Internal Medicine

## 2022-01-04 DIAGNOSIS — R1032 Left lower quadrant pain: Secondary | ICD-10-CM | POA: Diagnosis present

## 2022-01-05 ENCOUNTER — Other Ambulatory Visit: Payer: Self-pay | Admitting: Internal Medicine

## 2022-01-05 DIAGNOSIS — G43009 Migraine without aura, not intractable, without status migrainosus: Secondary | ICD-10-CM

## 2022-01-07 ENCOUNTER — Ambulatory Visit (INDEPENDENT_AMBULATORY_CARE_PROVIDER_SITE_OTHER): Payer: 59 | Admitting: Urology

## 2022-01-07 ENCOUNTER — Encounter: Payer: Self-pay | Admitting: Urology

## 2022-01-07 ENCOUNTER — Other Ambulatory Visit: Payer: Self-pay

## 2022-01-07 DIAGNOSIS — R109 Unspecified abdominal pain: Secondary | ICD-10-CM

## 2022-01-07 DIAGNOSIS — N390 Urinary tract infection, site not specified: Secondary | ICD-10-CM

## 2022-01-07 LAB — URINALYSIS, ROUTINE W REFLEX MICROSCOPIC
Bilirubin, UA: NEGATIVE
Glucose, UA: NEGATIVE
Ketones, UA: NEGATIVE
Leukocytes,UA: NEGATIVE
Nitrite, UA: NEGATIVE
Protein,UA: NEGATIVE
RBC, UA: NEGATIVE
Specific Gravity, UA: 1.02 (ref 1.005–1.030)
Urobilinogen, Ur: 0.2 mg/dL (ref 0.2–1.0)
pH, UA: 8.5 — ABNORMAL HIGH (ref 5.0–7.5)

## 2022-01-07 NOTE — Progress Notes (Signed)
01/07/2022 2:07 PM   Sarah Eaton 1986/04/13 [id number]  Referring provider: Lindell Spar, MD [address] [place],  [place] [address]  No chief complaint on file.   HPI: F/u -   1) left flank pain - noted Dec 2022. US renal benign. WBC was 9.5. Cr 0.87. UA clear. No cx. Nov 2022 Cx < 10 k. Pain again Feb 2023. No dysuria. She did have frequency. Urine culture February 2023 grew 50-100,000 pansensitive E. coli.  Note this was an improvement from her prior resistant cultures.  She had left lower quadrant pain.  CT scan of the abdomen and pelvis was obtained 01/04/2022 which showed a benign urinary tract.  No sign of renal mass, hydronephrosis, stone or pyelonephritis.She took two abx - sounds like Bactrim and Augmentin.    2) pyelonephritis - seen as hospital consult 05/04/2021 for a left pyelonephritis. CT scan of the abdomen and pelvis showed a lobar nephronia/focal nephritis of the left kidney upper pole about 3 cm, but no focal abscess/fluid to drain. Renal ultrasound was obtained the next day 05/05/2021 and again no fluid collection was noted.  ID was consulted and urine culture from outside hospital grew ESBL E. coli.  She was switched from meropenem to ceftriaxone and did well and discharged on cefdinir 300 mg p.o. twice daily to finish a duration of 10 days.   F/u US 06/25/2021 noted there to be no obvious lingering abnormality, scarring or fluid collection.   No prior h/o kidney infections but parents told her she did have UTIs as a child. No GU surgery. Constipation.    UA clear today.    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    PMH: Past Medical History:  Diagnosis Date   Hypertension    Obesity    Ovarian cyst    Pyelonephritis 05/04/2021   Sepsis due to Escherichia coli (E. coli) ([place] [place]) 05/04/2021    Surgical History: Past Surgical History:  Procedure Laterality Date   CESAREAN SECTION     CESAREAN SECTION WITH BILATERAL TUBAL  LIGATION Bilateral 08/05/2013   Procedure: REPEAT CESAREAN SECTION WITH BILATERAL TUBAL LIGATION;  Surgeon: Emily Filbert, MD;  Location: [facility];  Service: Obstetrics;  Laterality: Bilateral;   ENDOMETRIAL ABLATION W/ NOVASURE  09/2014   TUBAL LIGATION      Home Medications:  Allergies as of 01/07/2022       Reactions   Banana Anaphylaxis   Geralyn Flash [fish Allergy] Anaphylaxis   Tuna fish only per pt        Medication List        Accurate as of January 07, 2022  2:07 PM. If you have any questions, ask your nurse or doctor.          STOP taking these medications    medroxyPROGESTERone 10 MG tablet Commonly known as: PROVERA Stopped by: Festus Aloe, MD       TAKE these medications    linaclotide 72 MCG capsule Commonly known as: Linzess Take 1 capsule (72 mcg total) by mouth daily before breakfast.   lubiprostone 24 MCG capsule Commonly known as: AMITIZA Take 1 capsule (24 mcg total) by mouth 2 (two) times daily with a meal.   phentermine 37.5 MG tablet Commonly known as: ADIPEX-P Take 1 tablet (37.5 mg total) by mouth daily before breakfast.   rizatriptan 10 MG tablet Commonly known as: MAXALT TAKE 1 TABLET BY MOUTH AS NEEDED FOR MIGRAINE. MAY REPEAT IN 2 HOURS  IF NEEDED   topiramate 25 MG tablet Commonly known as: TOPAMAX TAKE 1 TABLET BY MOUTH TWICE A DAY        Allergies:  Allergies  Allergen Reactions   Banana Anaphylaxis   Geralyn Flash [Fish Allergy] Anaphylaxis    Tuna fish only per pt    Family History: Family History  Problem Relation Age of Onset   Cancer Other        breast   Hypertension Mother    Cancer Mother        skin   Thyroid disease Mother    Cancer Maternal Grandmother    Hypertension Maternal Grandmother    Thyroid disease Father    Thyroid disease Paternal Grandmother     Social History:  reports that she has never smoked. She has never used smokeless tobacco. She reports that she does not currently use alcohol. She  reports that she does not use drugs.   Physical Exam: BP 132/90    Pulse (!) 102    Wt 176 lb (79.8 kg)    BMI 32.19 kg/m   Constitutional:  Alert and oriented, No acute distress. HEENT: [place] AT, moist mucus membranes.  Trachea midline, no masses. Cardiovascular: No clubbing, cyanosis, or edema. Respiratory: Normal respiratory effort, no increased work of breathing. GI: Abdomen is soft, nontender, nondistended, no abdominal masses GU: No CVA tenderness\ Skin: No rashes, bruises or suspicious lesions. Neurologic: Grossly intact, no focal deficits, moving all 4 extremities. Psychiatric: Normal mood and affect.  Laboratory Data: Lab Results  Component Value Date   WBC 9.5 11/11/2021   HGB 14.3 11/11/2021   HCT 43.1 11/11/2021   MCV 91.7 11/11/2021   PLT 342 11/11/2021    Lab Results  Component Value Date   CREATININE 0.87 11/11/2021    No results found for: PSA  Lab Results  Component Value Date   TESTOSTERONE 12 03/13/2018    Lab Results  Component Value Date   HGBA1C 4.9 07/01/2019    Urinalysis    Component Value Date/Time   COLORURINE YELLOW 11/11/2021 2022   APPEARANCEUR CLEAR 11/11/2021 2022   APPEARANCEUR Clear 11/07/2021 0828   LABSPEC >1.030 (H) 11/11/2021 2022   LABSPEC 1.026 08/09/2014 1750   PHURINE 6.0 11/11/2021 2022   GLUCOSEU NEGATIVE 11/11/2021 2022   GLUCOSEU Negative 08/09/2014 1750   HGBUR NEGATIVE 11/11/2021 2022   BILIRUBINUR Negative 12/19/2021 0934   BILIRUBINUR Negative 11/07/2021 0828   BILIRUBINUR Negative 08/09/2014 [place] 11/11/2021 2022   PROTEINUR Positive (A) 12/19/2021 0934   PROTEINUR NEGATIVE 11/11/2021 2022   UROBILINOGEN 0.2 12/19/2021 0934   UROBILINOGEN 0.2 06/11/2014 1755   NITRITE Negative 12/19/2021 0934   NITRITE NEGATIVE 11/11/2021 2022   LEUKOCYTESUR Moderate (2+) (A) 12/19/2021 0934   LEUKOCYTESUR NEGATIVE 11/11/2021 2022   LEUKOCYTESUR Negative 08/09/2014 1750    Lab Results  Component  Value Date   LABMICR Comment 11/07/2021   BACTERIA NONE SEEN 03/04/2018    Pertinent Imaging: Renal US 12/22, CT 01/23   Results for orders placed during the hospital encounter of 11/11/21  US Renal  Narrative CLINICAL DATA:  Flank pain, history of pyelonephritis  EXAM: RENAL / URINARY TRACT ULTRASOUND COMPLETE  COMPARISON:  06/25/2021  FINDINGS: Right Kidney:  Renal measurements: 10.2 x 5.6 x 4.7 cm = volume: 140 mL. Normal cortical thickness and echogenicity. No mass, hydronephrosis, or shadowing calcification.  Left Kidney:  Renal measurements: 9.7 x 5.1 x 5.0 cm = volume: 130 mL. Normal cortical thickness  and echogenicity. No mass, hydronephrosis, or shadowing calcification.  Bladder:  Unable to adequately assessed, decompressed.  Other:  N/A  IMPRESSION: Normal sonographic appearance of kidneys.  Decompressed bladder.   Electronically Signed By: Lavonia Dana M.D. On: 11/11/2021 21:00    Assessment & Plan:    1. Left flank pain - unclear etiology but nothing worrisome on CT or [place]   2. Recurrent UTI Better after abx. See back this summer as planned  - Urinalysis, Routine w reflex microscopic   No follow-ups on file.  Festus Aloe, MD  [facility]  [address] [place], [place] [address] [phone]

## 2022-01-15 ENCOUNTER — Other Ambulatory Visit: Payer: Self-pay | Admitting: Internal Medicine

## 2022-01-15 DIAGNOSIS — G43009 Migraine without aura, not intractable, without status migrainosus: Secondary | ICD-10-CM

## 2022-01-22 ENCOUNTER — Encounter: Payer: Self-pay | Admitting: Internal Medicine

## 2022-01-22 ENCOUNTER — Ambulatory Visit (INDEPENDENT_AMBULATORY_CARE_PROVIDER_SITE_OTHER): Payer: 59 | Admitting: Internal Medicine

## 2022-01-22 ENCOUNTER — Other Ambulatory Visit: Payer: Self-pay

## 2022-01-22 ENCOUNTER — Other Ambulatory Visit: Payer: Self-pay | Admitting: Internal Medicine

## 2022-01-22 VITALS — BP 124/82 | HR 102 | Resp 18 | Ht 62.0 in | Wt 170.4 lb

## 2022-01-22 DIAGNOSIS — E669 Obesity, unspecified: Secondary | ICD-10-CM

## 2022-01-22 DIAGNOSIS — G43009 Migraine without aura, not intractable, without status migrainosus: Secondary | ICD-10-CM | POA: Diagnosis not present

## 2022-01-22 DIAGNOSIS — K5904 Chronic idiopathic constipation: Secondary | ICD-10-CM | POA: Diagnosis not present

## 2022-01-22 MED ORDER — SEMAGLUTIDE-WEIGHT MANAGEMENT 2.4 MG/0.75ML ~~LOC~~ SOAJ: 2.4000 mg | SUBCUTANEOUS | 0 refills | Status: DC

## 2022-01-22 MED ORDER — SEMAGLUTIDE-WEIGHT MANAGEMENT 1 MG/0.5ML ~~LOC~~ SOAJ: 1.0000 mg | SUBCUTANEOUS | 0 refills | Status: DC

## 2022-01-22 MED ORDER — SEMAGLUTIDE-WEIGHT MANAGEMENT 1.7 MG/0.75ML ~~LOC~~ SOAJ: 1.7000 mg | SUBCUTANEOUS | 0 refills | Status: DC

## 2022-01-22 MED ORDER — SEMAGLUTIDE-WEIGHT MANAGEMENT 0.5 MG/0.5ML ~~LOC~~ SOAJ: 0.5000 mg | SUBCUTANEOUS | 0 refills | Status: DC

## 2022-01-22 MED ORDER — SEMAGLUTIDE-WEIGHT MANAGEMENT 0.25 MG/0.5ML ~~LOC~~ SOAJ: 0.2500 mg | SUBCUTANEOUS | 0 refills | Status: DC

## 2022-01-22 NOTE — Assessment & Plan Note (Signed)
Has tried Colace, senna, MiraLAX and Dulcolax in the past ?Had responded well to Linzess, but not covered by her insurance now ?Now improved with Amitiza ?Will refer to GI if persistent concern ?

## 2022-01-22 NOTE — Progress Notes (Signed)
Established Patient Office Visit  Subjective:  Patient ID: Sarah Eaton, female    DOB: [birth year]  Age: 36 y.o. MRN: [id number]  CC:  Chief Complaint  Patient presents with   Follow-up    3 month follow up weight management and migraines migraines have been a little better    HPI Sarah Eaton is a 36 y.o. female with past medical history of migraine, obesity, Shingles and pyelonephritis who presents for f/u of her chronic medical conditions.  Migraine: Now well controlled with Topamax and PRN Maxalt. Her need for Maxalt has gone down.  She has been taking Adipex for weight loss and has lost about 6 lbs since the last visit. She has taken Adipex for 6 months now.  She has been trying to follow low-carb diet and performs exercises regularly. She is interested in GLP1 agonist therapy after having discussion about it.  Her constipation has improved with Amitiza now.  Denies any melena or hematochezia currently.  Her LLQ abdominal pain has improved now.   Past Medical History:  Diagnosis Date   Hypertension    Obesity    Ovarian cyst    Pyelonephritis 05/04/2021   Sepsis due to Escherichia coli (E. coli) ([place]) 05/04/2021    Past Surgical History:  Procedure Laterality Date   CESAREAN SECTION     CESAREAN SECTION WITH BILATERAL TUBAL LIGATION Bilateral 08/05/2013   Procedure: REPEAT CESAREAN SECTION WITH BILATERAL TUBAL LIGATION;  Surgeon: Emily Filbert, MD;  Location: [facility];  Service: Obstetrics;  Laterality: Bilateral;   ENDOMETRIAL ABLATION W/ NOVASURE  09/2014   TUBAL LIGATION      Family History  Problem Relation Age of Onset   Cancer Other        breast   Hypertension Mother    Cancer Mother        skin   Thyroid disease Mother    Cancer Maternal Grandmother    Hypertension Maternal Grandmother    Thyroid disease Father    Thyroid disease Paternal Grandmother     Social History   Socioeconomic History   Marital status: Married    Spouse name: Not on file    Number of children: Not on file   Years of education: Not on file   Highest education level: Not on file  Occupational History   Not on file  Tobacco Use   Smoking status: Never   Smokeless tobacco: Never  Vaping Use   Vaping Use: Never used  Substance and Sexual Activity   Alcohol use: Not Currently    Comment: occasionally   Drug use: Never   Sexual activity: Yes    Birth control/protection: Surgical  Other Topics Concern   Not on file  Social History Narrative   Not on file   Social Determinants of Health   Financial Resource Strain: Not on file  Food Insecurity: Not on file  Transportation Needs: Not on file  Physical Activity: Not on file  Stress: Not on file  Social Connections: Not on file  Intimate Partner Violence: Not on file    Outpatient Medications Prior to Visit  Medication Sig Dispense Refill   linaclotide (LINZESS) 72 MCG capsule Take 1 capsule (72 mcg total) by mouth daily before breakfast. 30 capsule 11   lubiprostone (AMITIZA) 24 MCG capsule Take 1 capsule (24 mcg total) by mouth 2 (two) times daily with a meal. 60 capsule 2   rizatriptan (MAXALT) 10 MG tablet TAKE 1 TABLET BY MOUTH AS NEEDED  FOR MIGRAINE. MAY REPEAT IN 2 HOURS IF NEEDED 4 tablet 2   topiramate (TOPAMAX) 25 MG tablet TAKE 1 TABLET BY MOUTH TWICE A DAY 180 tablet 0   phentermine (ADIPEX-P) 37.5 MG tablet Take 1 tablet (37.5 mg total) by mouth daily before breakfast. 30 tablet 2   No facility-administered medications prior to visit.    Allergies  Allergen Reactions   Banana Anaphylaxis   Geralyn Flash [Fish Allergy] Anaphylaxis    Tuna fish only per pt    ROS Review of Systems  Constitutional:  Negative for chills and fever.  HENT:  Negative for congestion, sinus pressure, sinus pain and sore throat.   Eyes:  Negative for pain and discharge.  Respiratory:  Negative for cough and shortness of breath.   Cardiovascular:  Negative for chest pain and palpitations.  Gastrointestinal:   Positive for constipation. Negative for diarrhea, nausea and vomiting.  Endocrine: Negative for polydipsia and polyuria.  Genitourinary:  Negative for dysuria and hematuria.  Musculoskeletal:  Negative for neck pain and neck stiffness.  Skin:  Negative for rash.  Neurological:  Positive for headaches. Negative for dizziness and weakness.  Psychiatric/Behavioral:  Negative for agitation and behavioral problems. The patient is nervous/anxious.      Objective:    Physical Exam Vitals reviewed.  Constitutional:      General: She is not in acute distress.    Appearance: She is obese. She is not diaphoretic.  HENT:     Head: Normocephalic and atraumatic.     Nose: Nose normal.     Mouth/Throat:     Mouth: Mucous membranes are moist.  Eyes:     General: No scleral icterus.    Extraocular Movements: Extraocular movements intact.  Cardiovascular:     Rate and Rhythm: Normal rate and regular rhythm.     Pulses: Normal pulses.     Heart sounds: Normal heart sounds. No murmur heard. Pulmonary:     Breath sounds: Normal breath sounds. No wheezing or rales.  Abdominal:     Palpations: Abdomen is soft.     Tenderness: There is no abdominal tenderness. There is no right CVA tenderness or left CVA tenderness.  Musculoskeletal:     Cervical back: Neck supple. No tenderness.     Right lower leg: No edema.     Left lower leg: No edema.  Skin:    General: Skin is warm.     Findings: No rash.  Neurological:     General: No focal deficit present.     Mental Status: She is alert and oriented to person, place, and time.     Sensory: No sensory deficit.     Motor: No weakness.  Psychiatric:        Mood and Affect: Mood normal.        Behavior: Behavior normal.    BP 124/82 (BP Location: Left Arm, Patient Position: Sitting, Cuff Size: Normal)    Pulse (!) 102    Resp 18    Ht 5' 2"  (1.575 m)    Wt 170 lb 6.4 oz (77.3 kg)    SpO2 100%    BMI 31.17 kg/m  Wt Readings from Last 3 Encounters:   01/22/22 170 lb 6.4 oz (77.3 kg)  01/07/22 176 lb (79.8 kg)  01/02/22 171 lb (77.6 kg)    Lab Results  Component Value Date   TSH 1.020 07/25/2021   Lab Results  Component Value Date   WBC 9.5 11/11/2021   HGB 14.3 11/11/2021  HCT 43.1 11/11/2021   MCV 91.7 11/11/2021   PLT 342 11/11/2021   Lab Results  Component Value Date   NA 137 11/11/2021   K 4.3 11/11/2021   CO2 25 11/11/2021   GLUCOSE 92 11/11/2021   BUN 16 11/11/2021   CREATININE 0.87 11/11/2021   BILITOT 0.4 11/11/2021   ALKPHOS 45 11/11/2021   AST 14 (L) 11/11/2021   ALT 20 11/11/2021   PROT 6.9 11/11/2021   ALBUMIN 4.0 11/11/2021   CALCIUM 9.5 11/11/2021   ANIONGAP 7 11/11/2021   EGFR 89 07/25/2021   Lab Results  Component Value Date   CHOL 184 07/25/2021   Lab Results  Component Value Date   HDL 59 07/25/2021   Lab Results  Component Value Date   LDLCALC 112 (H) 07/25/2021   Lab Results  Component Value Date   TRIG 67 07/25/2021   Lab Results  Component Value Date   CHOLHDL 3.1 07/25/2021   Lab Results  Component Value Date   HGBA1C 4.9 07/01/2019      Assessment & Plan:   Problem List Items Addressed This Visit       Cardiovascular and Mediastinum   Migraine    On Topiramate, better now Maxalt PRN for migraine flare up        Digestive   Chronic idiopathic constipation    Has tried Colace, senna, MiraLAX and Dulcolax in the past Had responded well to [place], but not covered by her insurance now Now improved with Amitiza Will refer to GI if persistent concern        Other   Obesity (BMI 30-39.9) - Primary    Body mass index is 31.17 kg/m.  She has been following low carb diet and portion control Exercises regularly Has tried Adipex Would prefer to start [facility] for long term benefit with weight loss BMI 31.17 when Wegovy started      Relevant Medications   Semaglutide-Weight Management 0.25 MG/0.5ML SOAJ   Semaglutide-Weight Management 0.5 MG/0.5ML SOAJ  (Start on 02/20/2022)   Semaglutide-Weight Management 1 MG/0.5ML SOAJ (Start on 03/21/2022)   Semaglutide-Weight Management 1.7 MG/0.75ML SOAJ (Start on 04/19/2022)   Semaglutide-Weight Management 2.4 MG/0.75ML SOAJ (Start on 05/18/2022)    Meds ordered this encounter  Medications   Semaglutide-Weight Management 0.25 MG/0.5ML SOAJ    Sig: Inject 0.25 mg into the skin once a week for 28 days.    Dispense:  2 mL    Refill:  0   Semaglutide-Weight Management 0.5 MG/0.5ML SOAJ    Sig: Inject 0.5 mg into the skin once a week for 28 days.    Dispense:  2 mL    Refill:  0   Semaglutide-Weight Management 1 MG/0.5ML SOAJ    Sig: Inject 1 mg into the skin once a week for 28 days.    Dispense:  2 mL    Refill:  0   Semaglutide-Weight Management 1.7 MG/0.75ML SOAJ    Sig: Inject 1.7 mg into the skin once a week for 28 days.    Dispense:  3 mL    Refill:  0   Semaglutide-Weight Management 2.4 MG/0.75ML SOAJ    Sig: Inject 2.4 mg into the skin once a week for 28 days.    Dispense:  3 mL    Refill:  0    Follow-up: Return in about 4 months (around 05/24/2022) for Weight management.    Lindell Spar, MD

## 2022-01-22 NOTE — Patient Instructions (Signed)
Please start taking Wegovy as prescribed. ? ?Please take small, frequent meals and continue to follow low carb diet. ?

## 2022-01-22 NOTE — Assessment & Plan Note (Signed)
On Topiramate, better now Maxalt PRN for migraine flare up 

## 2022-01-22 NOTE — Assessment & Plan Note (Addendum)
Body mass index is 31.17 kg/m?. ? ?She has been following low carb diet and portion control ?Exercises regularly ?Has tried Adipex ?Would prefer to start [facility] for long term benefit with weight loss ?BMI 31.17 when [facility] started ?

## 2022-01-23 ENCOUNTER — Other Ambulatory Visit: Payer: Self-pay | Admitting: Internal Medicine

## 2022-01-23 ENCOUNTER — Encounter: Payer: Self-pay | Admitting: Internal Medicine

## 2022-01-23 DIAGNOSIS — E669 Obesity, unspecified: Secondary | ICD-10-CM

## 2022-01-23 NOTE — Telephone Encounter (Signed)
Sent PA to [facility] per patient insurance  ?

## 2022-02-13 ENCOUNTER — Telehealth: Payer: Self-pay | Admitting: *Deleted

## 2022-02-13 ENCOUNTER — Encounter: Payer: Self-pay | Admitting: Internal Medicine

## 2022-02-13 ENCOUNTER — Encounter: Payer: Self-pay | Admitting: Obstetrics and Gynecology

## 2022-02-13 ENCOUNTER — Other Ambulatory Visit: Payer: Self-pay | Admitting: Internal Medicine

## 2022-02-13 DIAGNOSIS — E669 Obesity, unspecified: Secondary | ICD-10-CM

## 2022-02-13 MED ORDER — PHENTERMINE HCL 37.5 MG PO CAPS: 37.5000 mg | ORAL_CAPSULE | ORAL | 2 refills | Status: DC

## 2022-02-13 NOTE — Telephone Encounter (Signed)
Pt would like to know since wegovy was denied can you send in phentermine also would like to know if you can redo ablasion or does she need to  be referred out please advise  ?

## 2022-02-14 NOTE — Telephone Encounter (Signed)
Can you contact this patient and have patient scheduled for discussion for surgery in the next several weeks?

## 2022-02-20 ENCOUNTER — Other Ambulatory Visit: Payer: Self-pay | Admitting: Internal Medicine

## 2022-02-20 DIAGNOSIS — E669 Obesity, unspecified: Secondary | ICD-10-CM

## 2022-02-20 MED ORDER — SEMAGLUTIDE-WEIGHT MANAGEMENT 0.5 MG/0.5ML ~~LOC~~ SOAJ: 0.5000 mg | SUBCUTANEOUS | 0 refills | Status: AC

## 2022-02-20 MED ORDER — SEMAGLUTIDE-WEIGHT MANAGEMENT 1.7 MG/0.75ML ~~LOC~~ SOAJ: 1.7000 mg | SUBCUTANEOUS | 0 refills | Status: AC

## 2022-02-20 MED ORDER — SEMAGLUTIDE-WEIGHT MANAGEMENT 0.25 MG/0.5ML ~~LOC~~ SOAJ: 0.2500 mg | SUBCUTANEOUS | 0 refills | Status: AC

## 2022-02-20 MED ORDER — SEMAGLUTIDE-WEIGHT MANAGEMENT 1 MG/0.5ML ~~LOC~~ SOAJ: 1.0000 mg | SUBCUTANEOUS | 0 refills | Status: AC

## 2022-02-20 MED ORDER — SEMAGLUTIDE-WEIGHT MANAGEMENT 2.4 MG/0.75ML ~~LOC~~ SOAJ: 2.4000 mg | SUBCUTANEOUS | 0 refills | Status: DC

## 2022-03-05 ENCOUNTER — Ambulatory Visit (INDEPENDENT_AMBULATORY_CARE_PROVIDER_SITE_OTHER): Payer: 59 | Admitting: Obstetrics and Gynecology

## 2022-03-05 ENCOUNTER — Encounter: Payer: Self-pay | Admitting: Obstetrics and Gynecology

## 2022-03-05 VITALS — BP 135/97 | HR 110 | Resp 16 | Ht 62.0 in | Wt 159.0 lb

## 2022-03-05 DIAGNOSIS — Z01818 Encounter for other preprocedural examination: Secondary | ICD-10-CM | POA: Diagnosis not present

## 2022-03-05 DIAGNOSIS — Z9889 Other specified postprocedural states: Secondary | ICD-10-CM | POA: Diagnosis not present

## 2022-03-05 DIAGNOSIS — N938 Other specified abnormal uterine and vaginal bleeding: Secondary | ICD-10-CM

## 2022-03-05 NOTE — Patient Instructions (Signed)
GYNECOLOGY PRE-OPERATIVE INSTRUCTIONS ? ?You are scheduled for surgery on 04/01/2022.  The name of your procedure is: Laparoscopic Assisted Vaginal Hysterectomy bilateral tubal ligation ? ?Please read through these instructions carefully regarding preparation for your surgery: Nothing to eat after midnight on the day prior to surgery.  ?Do not take any medications unless recommended by your provider on day prior to surgery.  ?Do not take NSAIDs (Motrin, Aleve) or aspirin 7 days prior to surgery.  You may take Tylenol products for minor aches and pains.  ?You will receive a prescription for pain medications post-operatively.  ?You will be contacted by phone approximately 1-2 weeks prior to surgery to schedule your pre-operative appointment.  ?If you are being admitted to the hospital for an overnight stay, you will require COVID testing prior to surgery. Instructions will be given to you on when and where to have this performed.  ?Please call the office if you have any questions regarding your upcoming surgery.  ? ? ?Thank you for choosing [facility]. ? ? ? ? ?Laparoscopically Assisted Vaginal Hysterectomy ?A laparoscopically assisted vaginal hysterectomy (LAVH) is a surgical procedure to remove the uterus and cervix. Sometimes, the ovaries and fallopian tubes are also removed. This surgery may be done to treat problems such as: ?Noncancerous growths in the uterus (uterine fibroids) that cause symptoms. ?A condition that causes the lining of the uterus to grow in other areas (endometriosis). ?Problems with pelvic support. ?Cancer of the cervix, ovaries, uterus, or tissue that lines the uterus (endometrium). ?Excessive bleeding in the uterus. ?During an LAVH, some of the surgical removal is done through the vagina, and the rest is done through a few small incisions in the abdomen. This technique may be an option for women who are not able to have a traditional vaginal hysterectomy. After this procedure, you  will no longer be able to have a baby, and you will no longer have a menstrual period. ?Tell a health care provider about: ?Any allergies you have. ?All medicines you are taking, including vitamins, herbs, eye drops, creams, and over-the-counter medicines. ?Any problems you or family members have had with anesthetic medicines. ?Any blood disorders you have. ?Any surgeries you have had. ?Any medical conditions you have. ?Whether you are pregnant or may be pregnant. ?What are the risks? ?Generally, this is a safe procedure. However, problems may occur, including: ?Bleeding. ?Infection. ?Blood clots in the legs or lungs. ?Having to change from small incisions to open abdominal surgery. ?Allergic reactions to medicines. ?Damage to other structures or organs. ?What happens before the procedure? ?Staying hydrated ?Follow instructions from your health care provider about hydration, which may include: ?Up to 2 hours before the procedure - you may continue to drink clear liquids, such as water, clear fruit juice, black coffee, and plain tea. ? ?Eating and drinking restrictions ?Follow instructions from your health care provider about eating and drinking, which may include: ?8 hours before the procedure - stop eating heavy meals or foods, such as meat, fried foods, or fatty foods. ?6 hours before the procedure - stop eating light meals or foods, such as toast or cereal. ?6 hours before the procedure - stop drinking milk or drinks that contain milk. ?2 hours before the procedure - stop drinking clear liquids. ?Medicines ?Ask your health care provider about: ?Changing or stopping your regular medicines. This is especially important if you are taking diabetes medicines or blood thinners. ?Taking medicines such as aspirin and ibuprofen. These medicines can thin your blood. Do  not take these medicines unless your health care provider tells you to take them. ?Taking over-the-counter medicines, vitamins, herbs, and supplements. ?You  may be asked to take a medicine to empty your colon (bowel preparation). ?General instructions ?If you were asked to do bowel preparation before the procedure, follow instructions from your health care provider. ?This procedure can affect the way you feel about yourself. Talk with your health care provider about the physical and emotional changes hysterectomy may cause. ?Do not use any products that contain nicotine or tobacco for at least 4 weeks before the procedure. These products include cigarettes, chewing tobacco, and vaping devices, such as e-cigarettes. If you need help quitting, ask your health care provider. ?Plan to have a responsible adult take you home from the hospital or clinic. ?Plan to have a responsible adult care for you for the time you are told after you leave the hospital or clinic. This is important. ?Surgery safety ?Ask your health care provider: ?How your surgery site will be marked. ?What steps will be taken to help prevent infection. These steps may include: ?Removing hair at the surgery site. ?Washing skin with a germ-killing soap. ?Receiving antibiotic medicine. ?What happens during the procedure? ?An IV will be inserted into one of your veins. ?You may be given: ?A medicine to help you relax (sedative). ?A medicine to make you fall asleep (general anesthetic). ?A medicine to numb the area (local anesthetic). ?You may have a flexible tube (catheter) put into your bladder to drain urine. ?Tight-fitting (compression) stockings will be placed on your legs to promote circulation. ?Three or four small incisions will be made in your abdomen. An incision will also be made in your vagina. ?Surgical instruments will be inserted into the small incisions. The uterus and cervix, and possibly the ovaries and fallopian tubes, will be removed through your vagina and through the small incisions in the abdomen. ?The incisions will then be closed with stitches (sutures), skin glue, or adhesive  strips. ?The procedure may vary among health care providers and hospitals. ?What happens after the procedure? ?Your blood pressure, heart rate, breathing rate, and blood oxygen level will be monitored until you leave the hospital or clinic. ?You will be given pain medicine as needed. ?You may have a liquid diet at first. You will most likely return to your usual diet the day after surgery. ?You may still have the urinary catheter in place for several hours. It will likely be removed the day after surgery. ?You may have to wear compression stockings. These stockings help to prevent blood clots and reduce swelling in your legs. ?You will be encouraged to walk as soon as possible. You will also use a device or do breathing exercises to keep your lungs clear. ?You will need to wear a sanitary pad for vaginal discharge or bleeding. ?Summary ?A laparoscopically assisted vaginal hysterectomy (LAVH) is a surgical procedure to remove the uterus and cervix, and sometimes the ovaries and fallopian tubes. ?Follow instructions from your health care provider about eating and drinking before the procedure. ?During an LAVH, some of the surgical removal is done through the vagina, and the rest is done through a few small incisions in the abdomen. ?After the procedure you will be given pain medication as needed and will need to wear a sanitary pad. ?Plan to have a responsible adult take you home from the hospital or clinic. ?This information is not intended to replace advice given to you by your health care provider. Make sure you  discuss any questions you have with your health care provider. ?Document Revised: 07/07/2020 Document Reviewed: 07/07/2020 ?Elsevier Patient Education ? 2023 [employer] ? ?

## 2022-03-05 NOTE — Progress Notes (Signed)
? ? ?GYNECOLOGY PROGRESS NOTE ? ?Subjective:  ? ? Patient ID: Sarah Eaton, female    DOB: 1986/07/31, 36 y.o.   MRN: [id number] ? ?HPI ? Patient is a 36 y.o. G52P2002 female who presents for discussion of management of DUB.  She has a h/o endometrial ablation approximately 10 years ago.  Notes that ever since her hospitalizations last year for recurrent severe UTIs, including an episode of sepsis, her menstrual cycles have resumed again and become heavy.  Desires to discuss surgical options again, has been considering repeating her ablation. ? ?The following portions of the patient's history were reviewed and updated as appropriate: allergies, current medications, past family history, past medical history, past social history, past surgical history, and problem list. ? ?Review of Systems ?Pertinent items noted in HPI and remainder of comprehensive ROS otherwise negative.  ? ?Objective:  ? Blood pressure (!) 135/97, pulse (!) 110, resp. rate 16, height 5\' 2"  (1.575 m), weight 159 lb (72.1 kg), last menstrual period 02/16/2022. Body mass index is 29.08 kg/m?. ?General appearance: alert and no distress ?Abdomen: soft, non-tender; bowel sounds normal; no masses,  no organomegaly ?Pelvic: deferred ? ? ?Imaging: ?CLINICAL DATA:  Left lower quadrant pain. Abnormal appearance of ?left ovary on recent CT. Previous endometrial ablation. ?  ?EXAM: ?TRANSABDOMINAL AND TRANSVAGINAL ULTRASOUND OF PELVIS ?  ?DOPPLER ULTRASOUND OF OVARIES ?  ?TECHNIQUE: ?Both transabdominal and transvaginal ultrasound examinations of the ?pelvis were performed. Transabdominal technique was performed for ?global imaging of the pelvis including uterus, ovaries, adnexal ?regions, and pelvic cul-de-sac. ?  ?It was necessary to proceed with endovaginal exam following the ?transabdominal exam to visualize the endometrium and ovaries. Color ?and duplex Doppler ultrasound was utilized to evaluate blood flow to ?the ovaries. ?  ?COMPARISON:  CT on  05/04/2021 ?  ?FINDINGS: ?Uterus ?  ?Measurements: 8.8 x 3.6 x 4.8 cm = volume: 80 mL. Heterogeneous ?appearance of myometrium is noted with indistinct ?endometrial-myometrial junction, which is attributable to prior ?endometrial ablation. No distinct myometrial masses are identified. ?  ?Endometrium ?  ?Thickness: 8 mm. A small cystic focus is seen in the upper portion ?of the endometrial cavity, likely the sequela of previous ?endometrial ablation. ?  ?Right ovary ?  ?Measurements: 2.9 x 2.1 x 1.6 cm = volume: 6 mL. Normal ?appearance/no adnexal mass. ?  ?Left ovary ?  ?Measurements: 4.5 x 2.1 x 4.7 cm = volume: 23 mL. A heterogeneous ?hypoechoic lesion is seen in the left ovary, which measures 2.7 x ?1.7 x 3.0 cm and shows no evidence of internal blood flow on color ?Doppler ultrasound. These features are suggestive of but not classic ?for a hemorrhagic ovarian cyst. ?  ?Pulsed Doppler evaluation of both ovaries demonstrates normal ?low-resistance arterial and venous waveforms. ?  ?Other findings ?  ?No abnormal free fluid. ?  ?IMPRESSION: ?3 cm indeterminate but probably benign left ovarian lesion, which ?may represent a hemorrhagic ovarian cyst. Recommend follow-up by ?transvaginal pelvic ultrasound in 6-12 weeks to confirm resolution. ?This recommendation follows the consensus statement: Management of ?Asymptomatic Ovarian and Other Adnexal Cysts Imaged at [place]: Society ?of Radiologists in Ultrasound Consensus Conference Statement. ?Radiology 2010; [id number]. ?  ?No sonographic evidence for ovarian torsion. ?  ?Findings consistent with previous endometrial ablation. ?  ?  ?Electronically Signed ?  By: Marlaine Hind M.D. ?  On: 05/04/2021 13:52 ?CT Abdomen Pelvis Wo Contrast ?CLINICAL DATA:  Left lower quadrant abdominal pain. ? ?EXAM: ?CT ABDOMEN AND PELVIS WITHOUT CONTRAST ? ?TECHNIQUE: ?Multidetector  CT imaging of the abdomen and pelvis was performed ?following the standard protocol without IV  contrast. ? ?RADIATION DOSE REDUCTION: This exam was performed according to the ?departmental dose-optimization program which includes automated ?exposure control, adjustment of the mA and/or kV according to ?patient size and/or use of iterative reconstruction technique. ? ?COMPARISON:  CT of the abdomen and pelvis on 05/04/2021 ? ?FINDINGS: ?Lower chest: No acute abnormality. ? ?Hepatobiliary: No focal liver abnormality is seen. No gallstones, ?gallbladder wall thickening, or biliary dilatation. ? ?Pancreas: Unremarkable. No pancreatic ductal dilatation or ?surrounding inflammatory changes. ? ?Spleen: No splenic injury or perisplenic hematoma. ? ?Adrenals/Urinary Tract: Adrenal glands are unremarkable. Kidneys are ?normal, without renal calculi, focal lesion, or hydronephrosis. By ?unenhanced CT, no further inflammation of the left kidney is ?visualized. Bladder is unremarkable. ? ?Stomach/Bowel: Bowel shows no evidence of obstruction, ileus, ?inflammation or lesion. No evidence of diverticulosis or ?diverticulitis. ? ?Vascular/Lymphatic: No significant vascular findings are present. No ?enlarged abdominal or pelvic lymph nodes. ? ?Reproductive: Uterus and ovaries appear unremarkable with probable ?normal follicular cysts present in both ovaries. Again noted is a ?displaced left tubal ligation clip in the posterior pelvis. ? ?Other: No abdominal wall hernia or abnormality. No abdominopelvic ?ascites. ? ?Musculoskeletal: No acute or significant osseous findings. ? ?IMPRESSION: ?No acute findings in the abdomen or pelvis. ? ?Electronically Signed ?  By: Aletta Edouard M.D. ?  On: 01/05/2022 10:53 ? ?  ?Assessment:  ? ?1. DUB (dysfunctional uterine bleeding)   ?2. History of endometrial ablation   ?  ? ?Plan:  ? ?Discussed management options for abnormal uterine bleeding including NSAIDs (Naproxen), tranexamic acid (Lysteda), oral progesterone, Depo Provera, Levonogestrel IUD, endometrial ablation or hysterectomy as  definitive surgical management.  Discussed risks and benefits of each method.  Patient notes that she has tried medications in the past including the IUD and it usually caused worsening of her bleeding and pelvic pain/bloating.  Patient desires surgical intervention.  After discussion of repeating endometrial ablation or hysterectomy including risks and benefits of the procedure, patient notes that she is fine to proceed with hysterectomy at this time.  Patient does have a history of previous tubal ligation.  Had concerns about going into early menopause, however I discussed with her that we would not surgically remove her ovaries and so she would go through menopause at her normal timeframe..  Printed patient education handouts were given to the patient to review at home.  Declines hormonal management in the meantime.  Bleeding precautions reviewed.  We will tentatively schedule surgery for 04/01/2022 for LAVH versus TLH.  Patient to return in 3 weeks for preoperative visit. ? ? ?A total of 25 minutes were spent face-to-face with the patient during this encounter and over half of that time involved counseling and coordination of care. ? ? ?

## 2022-03-21 NOTE — Progress Notes (Signed)
?  See pre-op H&P for today's visit.  ? ? ?

## 2022-03-25 ENCOUNTER — Encounter
Admission: RE | Admit: 2022-03-25 | Discharge: 2022-03-25 | Disposition: A | Discharge status: Home / Self Care | Payer: 59 | Source: Ambulatory Visit | Attending: Obstetrics and Gynecology | Admitting: Obstetrics and Gynecology

## 2022-03-25 ENCOUNTER — Other Ambulatory Visit: Payer: Self-pay

## 2022-03-25 HISTORY — DX: Anemia, unspecified: D64.9

## 2022-03-25 NOTE — Patient Instructions (Addendum)
Your procedure is scheduled on:04/01/2022  ?Report to the Registration Desk on the 1st floor of the [facility]. ?To find out your arrival time, please call [phone] between 1PM - 3PM on:03/29/2022  ?If your arrival time is 6:00 am, do not arrive prior to that time as the [facility] entrance doors do not open until 6:00 am. ? ?REMEMBER: ?Instructions that are not followed completely may result in serious medical risk, up to and including death; or upon the discretion of your surgeon and anesthesiologist your surgery may need to be rescheduled. ? ?Do not eat food after midnight the night before surgery.  ?No gum chewing, lozengers or hard candies. ? ?You may however, drink CLEAR liquids up to 2 hours before you are scheduled to arrive for your surgery. In addition to, drink the pre-surgery (ensure) provided within the time frame instructed to you. Do not drink anything within 2 hours of your scheduled arrival time. ? ?Clear liquids include: ?- water  ?- apple juice without pulp ?- gatorade (not RED colors) ?- black coffee or tea (Do NOT add milk or creamers to the coffee or tea) ?Do NOT drink anything that is not on this list. ? ? ? ?TAKE THESE MEDICATIONS THE MORNING OF SURGERY WITH A SIP OF WATER: ?MAXALT ?topiramate  ? ? ? ? ?One week prior to surgery: ?Stop Anti-inflammatories (NSAIDS) such as Advil, Aleve, Ibuprofen, Motrin, Naproxen, Naprosyn and Aspirin based products such as Excedrin, Goodys Powder, BC Powder. ?Stop ANY OVER THE COUNTER supplements until after surgery. ?You may however, continue to take Tylenol if needed for pain up until the day of surgery. ? ?No Alcohol for 24 hours before or after surgery. ? ?No Smoking including e-cigarettes for 24 hours prior to surgery.  ?No chewable tobacco products for at least 6 hours prior to surgery.  ?No nicotine patches on the day of surgery. ? ?Do not use any "recreational" drugs for at least a week prior to your surgery.  ?Please be advised that the  combination of cocaine and anesthesia may have negative outcomes, up to and including death. ?If you test positive for cocaine, your surgery will be cancelled. ? ?On the morning of surgery brush your teeth with toothpaste and water, you may rinse your mouth with mouthwash if you wish. ?Do not swallow any toothpaste or mouthwash. ? ?Use CHG Soap with shower as directed on instruction sheet. ? ?Do not wear jewelry, make-up, hairpins, clips or nail polish.  ? ?Do not wear lotions, powders, or perfumes. Deodorant is allowed. ? ?Do not shave body from the neck down 48 hours prior to surgery just in case you cut yourself which could leave a site for infection.  ?Also, freshly shaved skin may become irritated if using the CHG soap. ? ?Contact lenses, hearing aids and dentures may not be worn into surgery. ? ?Do not bring valuables to the hospital. [facility] is not responsible for any missing/lost belongings or valuables.  ? ? ?Notify your doctor if there is any change in your medical condition (cold, fever, infection). ? ?Wear comfortable clothing (specific to your surgery type) to the hospital. ? ?After surgery, you can help prevent lung complications by doing breathing exercises.  ?Take deep breaths and cough every 1-2 hours. Your doctor may order a device called an Incentive Spirometer to help you take deep breaths. ?If you are being admitted to the hospital overnight, leave your suitcase in the car. ?After surgery it may be brought to your room. ? ?  If you are being discharged the day of surgery, you will not be allowed to drive home. ?You will need a responsible adult (18 years or older) to drive you home and stay with you that night.  ? ?If you are taking public transportation, you will need to have a responsible adult (18 years or older) with you. ?Please confirm with your physician that it is acceptable to use public transportation.  ? ?Please call the Pre-admissions Testing Dept. at [phone] if you have any  questions about these instructions. ? ?Surgery Visitation Policy: ? ?Patients undergoing a surgery or procedure may have two family members or support persons with them as long as the person is not COVID-19 positive or experiencing its symptoms.  ? ?Inpatient Visitation:   ? ?Visiting hours are 7 a.m. to 8 p.m. ?Up to four visitors are allowed at one time in a patient room, including children. The visitors may rotate out with other people during the day. One designated support person (adult) may remain overnight.  ?

## 2022-03-26 ENCOUNTER — Encounter: Payer: Self-pay | Admitting: Urgent Care

## 2022-03-26 ENCOUNTER — Encounter
Admission: RE | Admit: 2022-03-26 | Discharge: 2022-03-26 | Disposition: A | Discharge status: Home / Self Care | Payer: 59 | Source: Ambulatory Visit | Attending: Obstetrics and Gynecology | Admitting: Obstetrics and Gynecology

## 2022-03-26 DIAGNOSIS — Z01818 Encounter for other preprocedural examination: Secondary | ICD-10-CM

## 2022-03-26 DIAGNOSIS — Z01812 Encounter for preprocedural laboratory examination: Secondary | ICD-10-CM | POA: Insufficient documentation

## 2022-03-26 LAB — COMPREHENSIVE METABOLIC PANEL
ALT: 14 U/L (ref 0–44)
AST: 14 U/L — ABNORMAL LOW (ref 15–41)
Albumin: 4.4 g/dL (ref 3.5–5.0)
Alkaline Phosphatase: 45 U/L (ref 38–126)
Anion gap: 6 (ref 5–15)
BUN: 14 mg/dL (ref 6–20)
CO2: 24 mmol/L (ref 22–32)
Calcium: 8.9 mg/dL (ref 8.9–10.3)
Chloride: 108 mmol/L (ref 98–111)
Creatinine, Ser: 0.8 mg/dL (ref 0.44–1.00)
GFR, Estimated: >60 mL/min (ref >60)
Glucose, Bld: 79 mg/dL (ref 70–99)
Potassium: 3.6 mmol/L (ref 3.5–5.1)
Sodium: 138 mmol/L (ref 135–145)
Total Bilirubin: 0.7 mg/dL (ref 0.3–1.2)
Total Protein: 7.4 g/dL (ref 6.5–8.1)

## 2022-03-26 LAB — CBC
HCT: 41 % (ref 36.0–46.0)
Hemoglobin: 13.6 g/dL (ref 12.0–15.0)
MCH: 29.6 pg (ref 26.0–34.0)
MCHC: 33.2 g/dL (ref 30.0–36.0)
MCV: 89.3 fL (ref 80.0–100.0)
Platelets: 319 10*3/uL (ref 150–400)
RBC: 4.59 MIL/uL (ref 3.87–5.11)
RDW: 12.8 % (ref 11.5–15.5)
WBC: 6.9 10*3/uL (ref 4.0–10.5)
nRBC: 0 % (ref 0.0–0.2)

## 2022-03-26 LAB — TYPE AND SCREEN
ABO/RH(D): A POS
Antibody Screen: NEGATIVE

## 2022-03-26 LAB — RAPID HIV SCREEN (HIV 1/2 AB+AG)
HIV 1/2 Antibodies: NONREACTIVE
HIV-1 P24 Antigen - HIV24: NONREACTIVE

## 2022-03-27 ENCOUNTER — Ambulatory Visit (INDEPENDENT_AMBULATORY_CARE_PROVIDER_SITE_OTHER): Payer: 59 | Admitting: Obstetrics and Gynecology

## 2022-03-27 ENCOUNTER — Encounter: Payer: Self-pay | Admitting: Obstetrics and Gynecology

## 2022-03-27 VITALS — BP 112/88 | HR 88 | Resp 16 | Ht 62.0 in | Wt 158.4 lb

## 2022-03-27 DIAGNOSIS — N938 Other specified abnormal uterine and vaginal bleeding: Secondary | ICD-10-CM

## 2022-03-27 DIAGNOSIS — Z9889 Other specified postprocedural states: Secondary | ICD-10-CM

## 2022-03-27 LAB — RPR: RPR Ser Ql: NONREACTIVE

## 2022-03-27 NOTE — H&P (Signed)
? ? ? ?GYNECOLOGY PREOPERATIVE HISTORY AND PHYSICAL  ? ?Subjective:  ?Sarah Eaton is a 36 y.o. [phone] here for surgical management of dysfunctional uterine bleeding.   She has a history of endometrial ablation performed ~ 8 years ago, with resumption of abnormal bleeding over the past few months. Declines medication management at this time. No significant preoperative concerns. ? ?Proposed surgery: Laparoscopic-assisted vaginal hysterectomy ? ? ? ?Pertinent Gynecological History: ?Menses:  irregular vaginal bleeding with intermittent spotting ?Bleeding: dysfunctional uterine bleeding ?Contraception: tubal ligation ?Last pap: normal Date: 08/21/2021.  ? ? ?Past Medical History:  ?Diagnosis Date  ? Anemia   ? Hypertension   ? Obesity   ? Ovarian cyst   ? Pyelonephritis 05/04/2021  ? Sepsis due to Escherichia coli (E. coli) (HCC) 05/04/2021  ? ? ?Past Surgical History:  ?Procedure Laterality Date  ? CESAREAN SECTION    ? CESAREAN SECTION WITH BILATERAL TUBAL LIGATION Bilateral 08/05/2013  ? Procedure: REPEAT CESAREAN SECTION WITH BILATERAL TUBAL LIGATION;  Surgeon: Allie Bossier, MD;  Location: [facility];  Service: Obstetrics;  Laterality: Bilateral;  ? ENDOMETRIAL ABLATION W/ NOVASURE  09/2014  ? TUBAL LIGATION    ? ? ?OB History  ?Gravida Para Term Preterm AB Living  ?2 2 2     2   ?SAB IAB Ectopic Multiple Live Births  ?        2  ?  ?# Outcome Date GA Lbr Len/2nd Weight Sex Delivery Anes PTL Lv  ?2 Term 08/05/13 [redacted]w[redacted]d  6 lb 11.8 oz (3.055 kg) M CS-LTranv Spinal  LIV  ?1 Term 01/14/10 [redacted]w[redacted]d   F CS-LTranv Spinal  LIV  ?   Birth Comments: Failed IOL  ? ? ?Family History  ?Problem Relation Age of Onset  ? Cancer Other   ?     breast  ? Hypertension Mother   ? Cancer Mother   ?     skin  ? Thyroid disease Mother   ? Cancer Maternal Grandmother   ? Hypertension Maternal Grandmother   ? Thyroid disease Father   ? Thyroid disease Paternal Grandmother   ? ? ?Social History  ? ?Socioeconomic History  ? Marital status: Legally  Separated  ?  Spouse name: Not on file  ? Number of children: Not on file  ? Years of education: Not on file  ? Highest education level: Not on file  ?Occupational History  ? Not on file  ?Tobacco Use  ? Smoking status: Never  ? Smokeless tobacco: Never  ?Vaping Use  ? Vaping Use: Never used  ?Substance and Sexual Activity  ? Alcohol use: Not Currently  ?  Comment: occasionally  ? Drug use: Never  ? Sexual activity: Yes  ?  Birth control/protection: Surgical  ?Other Topics Concern  ? Not on file  ?Social History Narrative  ? Live at home with children.    ? ?Social Determinants of Health  ? ?Financial Resource Strain: Not on file  ?Food Insecurity: Not on file  ?Transportation Needs: Not on file  ?Physical Activity: Not on file  ?Stress: Not on file  ?Social Connections: Not on file  ?Intimate Partner Violence: Not on file  ? ? ?Current Outpatient Medications on File Prior to Visit  ?Medication Sig Dispense Refill  ? phentermine 37.5 MG capsule Take 1 capsule (37.5 mg total) by mouth every morning. 30 capsule 2  ? rizatriptan (MAXALT) 10 MG tablet TAKE 1 TABLET BY MOUTH AS NEEDED FOR MIGRAINE. MAY REPEAT IN 2 HOURS  IF NEEDED 4 tablet 2  ? Semaglutide-Weight Management 0.5 MG/0.5ML SOAJ Inject 0.5 mg into the skin once a week for 28 days. 2 mL 0  ? [START ON 04/19/2022] Semaglutide-Weight Management 1 MG/0.5ML SOAJ Inject 1 mg into the skin once a week for 28 days. 2 mL 0  ? [START ON 05/18/2022] Semaglutide-Weight Management 1.7 MG/0.75ML SOAJ Inject 1.7 mg into the skin once a week for 28 days. 3 mL 0  ? [START ON 06/16/2022] Semaglutide-Weight Management 2.4 MG/0.75ML SOAJ Inject 2.4 mg into the skin once a week for 28 days. 3 mL 0  ? topiramate (TOPAMAX) 25 MG tablet TAKE 1 TABLET BY MOUTH TWICE A DAY 180 tablet 0  ? ?No current facility-administered medications on file prior to visit.  ? ?Allergies  ?Allergen Reactions  ? Banana Anaphylaxis  ? Prescott Gum [Fish Allergy] Anaphylaxis  ?  Tuna fish only per pt  ? ? ? ?Review  of Systems ?Constitutional: No recent fever/chills/sweats ?Respiratory: No recent cough/bronchitis ?Cardiovascular: No chest pain ?Gastrointestinal: No recent nausea/vomiting/diarrhea ?Genitourinary: No UTI symptoms ?Hematologic/lymphatic:No history of coagulopathy or recent blood thinner use  ?  ?Objective:  ? ?Blood pressure 112/88, pulse 88, resp. rate 16, height 5\' 2"  (1.575 m), weight 158 lb 6.4 oz (71.8 kg).  Body mass index is 28.97 kg/m?. ? ?CONSTITUTIONAL: Well-developed, well-nourished female in no acute distress.  ?HENT:  Normocephalic, atraumatic, External right and left ear normal. Oropharynx is clear and moist ?EYES: Conjunctivae and EOM are normal. Pupils are equal, round, and reactive to light. No scleral icterus.  ?NECK: Normal range of motion, supple, no masses ?SKIN: Skin is warm and dry. No rash noted. Not diaphoretic. No erythema. No pallor. ?NEUROLOGIC: Alert and oriented to person, place, and time. Normal reflexes, muscle tone coordination. No cranial nerve deficit noted. ?PSYCHIATRIC: Normal mood and affect. Normal behavior. Normal judgment and thought content. ?CARDIOVASCULAR: Normal heart rate noted, regular rhythm ?RESPIRATORY: Effort and breath sounds normal, no problems with respiration noted ?ABDOMEN: Soft, nontender, nondistended. ?PELVIC: Deferred ?MUSCULOSKELETAL: Normal range of motion. No edema and no tenderness. 2+ distal pulses. ? ? ? ?Labs: ?Results for orders placed or performed during the hospital encounter of 03/26/22 (from the past 336 hour(s))  ?RPR  ? Collection Time: 03/26/22 10:53 AM  ?Result Value Ref Range  ? RPR Ser Ql NON REACTIVE NON REACTIVE  ?Rapid HIV screen (HIV 1/2 Ab+Ag)  ? Collection Time: 03/26/22 10:53 AM  ?Result Value Ref Range  ? HIV-1 P24 Antigen - HIV24 NON REACTIVE NON REACTIVE  ? HIV 1/2 Antibodies NON REACTIVE NON REACTIVE  ? Interpretation (HIV Ag Ab)    ?  A non reactive test result means that HIV 1 or HIV 2 antibodies and HIV 1 p24 antigen were  not detected in the specimen.  ?CBC  ? Collection Time: 03/26/22 10:53 AM  ?Result Value Ref Range  ? WBC 6.9 4.0 - 10.5 K/uL  ? RBC 4.59 3.87 - 5.11 MIL/uL  ? Hemoglobin 13.6 12.0 - 15.0 g/dL  ? HCT 41.0 36.0 - 46.0 %  ? MCV 89.3 80.0 - 100.0 fL  ? MCH 29.6 26.0 - 34.0 pg  ? MCHC 33.2 30.0 - 36.0 g/dL  ? RDW 12.8 11.5 - 15.5 %  ? Platelets 319 150 - 400 K/uL  ? nRBC 0.0 0.0 - 0.2 %  ?Comprehensive metabolic panel  ? Collection Time: 03/26/22 10:53 AM  ?Result Value Ref Range  ? Sodium 138 135 - 145 mmol/L  ? Potassium 3.6  3.5 - 5.1 mmol/L  ? Chloride 108 98 - 111 mmol/L  ? CO2 24 22 - 32 mmol/L  ? Glucose, Bld 79 70 - 99 mg/dL  ? BUN 14 6 - 20 mg/dL  ? Creatinine, Ser 0.80 0.44 - 1.00 mg/dL  ? Calcium 8.9 8.9 - 10.3 mg/dL  ? Total Protein 7.4 6.5 - 8.1 g/dL  ? Albumin 4.4 3.5 - 5.0 g/dL  ? AST 14 (L) 15 - 41 U/L  ? ALT 14 0 - 44 U/L  ? Alkaline Phosphatase 45 38 - 126 U/L  ? Total Bilirubin 0.7 0.3 - 1.2 mg/dL  ? GFR, Estimated >60 >60 mL/min  ? Anion gap 6 5 - 15  ?Type and screen  ? Collection Time: 03/26/22 10:53 AM  ?Result Value Ref Range  ? ABO/RH(D) A POS   ? Antibody Screen NEG   ? Sample Expiration 04/09/2022,2359   ? Extend sample reason    ?  NO TRANSFUSIONS OR PREGNANCY IN THE PAST 3 MONTHS ?Performed at [facility], [address] [place], [place] [address] ?  ? ? ? ?Imaging Studies: ?No results found. ? ?Assessment:  ?  ?1. DUB (dysfunctional uterine bleeding)   ?2. History of endometrial ablation   ? ?Plan:  ? ?Counseling: Procedure, risks, reasons, benefits and complications (including injury to bowel, bladder, major blood vessel, ureter, bleeding, possibility of transfusion, infection, or fistula formation) reviewed in detail. Likelihood of success in alleviating the patient's condition was discussed. Routine postoperative instructions will be reviewed with the patient and her family in detail after surgery.  The patient concurred with the proposed plan, giving informed written consent  for the surgery.   ?Preop testing ordered. ?Instructions reviewed, including NPO after midnight. ?  ? ?Hildred Laserherry, Anniece Bleiler, MD ?[facility] ? ? ? ?

## 2022-03-27 NOTE — H&P (View-Only) (Signed)
? ? ? ?GYNECOLOGY PREOPERATIVE HISTORY AND PHYSICAL  ? ?Subjective:  ?Sarah Eaton is a 36 y.o. [phone] here for surgical management of dysfunctional uterine bleeding.   She has a history of endometrial ablation performed ~ 8 years ago, with resumption of abnormal bleeding over the past few months. Declines medication management at this time. No significant preoperative concerns. ? ?Proposed surgery: Laparoscopic-assisted vaginal hysterectomy ? ? ? ?Pertinent Gynecological History: ?Menses:  irregular vaginal bleeding with intermittent spotting ?Bleeding: dysfunctional uterine bleeding ?Contraception: tubal ligation ?Last pap: normal Date: 08/21/2021.  ? ? ?Past Medical History:  ?Diagnosis Date  ? Anemia   ? Hypertension   ? Obesity   ? Ovarian cyst   ? Pyelonephritis 05/04/2021  ? Sepsis due to Escherichia coli (E. coli) (HCC) 05/04/2021  ? ? ?Past Surgical History:  ?Procedure Laterality Date  ? CESAREAN SECTION    ? CESAREAN SECTION WITH BILATERAL TUBAL LIGATION Bilateral 08/05/2013  ? Procedure: REPEAT CESAREAN SECTION WITH BILATERAL TUBAL LIGATION;  Surgeon: Allie Bossier, MD;  Location: [facility];  Service: Obstetrics;  Laterality: Bilateral;  ? ENDOMETRIAL ABLATION W/ NOVASURE  09/2014  ? TUBAL LIGATION    ? ? ?OB History  ?Gravida Para Term Preterm AB Living  ?2 2 2     2   ?SAB IAB Ectopic Multiple Live Births  ?        2  ?  ?# Outcome Date GA Lbr Len/2nd Weight Sex Delivery Anes PTL Lv  ?2 Term 08/05/13 [redacted]w[redacted]d  6 lb 11.8 oz (3.055 kg) M CS-LTranv Spinal  LIV  ?1 Term 01/14/10 [redacted]w[redacted]d   F CS-LTranv Spinal  LIV  ?   Birth Comments: Failed IOL  ? ? ?Family History  ?Problem Relation Age of Onset  ? Cancer Other   ?     breast  ? Hypertension Mother   ? Cancer Mother   ?     skin  ? Thyroid disease Mother   ? Cancer Maternal Grandmother   ? Hypertension Maternal Grandmother   ? Thyroid disease Father   ? Thyroid disease Paternal Grandmother   ? ? ?Social History  ? ?Socioeconomic History  ? Marital status: Legally  Separated  ?  Spouse name: Not on file  ? Number of children: Not on file  ? Years of education: Not on file  ? Highest education level: Not on file  ?Occupational History  ? Not on file  ?Tobacco Use  ? Smoking status: Never  ? Smokeless tobacco: Never  ?Vaping Use  ? Vaping Use: Never used  ?Substance and Sexual Activity  ? Alcohol use: Not Currently  ?  Comment: occasionally  ? Drug use: Never  ? Sexual activity: Yes  ?  Birth control/protection: Surgical  ?Other Topics Concern  ? Not on file  ?Social History Narrative  ? Live at home with children.    ? ?Social Determinants of Health  ? ?Financial Resource Strain: Not on file  ?Food Insecurity: Not on file  ?Transportation Needs: Not on file  ?Physical Activity: Not on file  ?Stress: Not on file  ?Social Connections: Not on file  ?Intimate Partner Violence: Not on file  ? ? ?Current Outpatient Medications on File Prior to Visit  ?Medication Sig Dispense Refill  ? phentermine 37.5 MG capsule Take 1 capsule (37.5 mg total) by mouth every morning. 30 capsule 2  ? rizatriptan (MAXALT) 10 MG tablet TAKE 1 TABLET BY MOUTH AS NEEDED FOR MIGRAINE. MAY REPEAT IN 2 HOURS  IF NEEDED 4 tablet 2  ? Semaglutide-Weight Management 0.5 MG/0.5ML SOAJ Inject 0.5 mg into the skin once a week for 28 days. 2 mL 0  ? [START ON 04/19/2022] Semaglutide-Weight Management 1 MG/0.5ML SOAJ Inject 1 mg into the skin once a week for 28 days. 2 mL 0  ? [START ON 05/18/2022] Semaglutide-Weight Management 1.7 MG/0.75ML SOAJ Inject 1.7 mg into the skin once a week for 28 days. 3 mL 0  ? [START ON 06/16/2022] Semaglutide-Weight Management 2.4 MG/0.75ML SOAJ Inject 2.4 mg into the skin once a week for 28 days. 3 mL 0  ? topiramate (TOPAMAX) 25 MG tablet TAKE 1 TABLET BY MOUTH TWICE A DAY 180 tablet 0  ? ?No current facility-administered medications on file prior to visit.  ? ?Allergies  ?Allergen Reactions  ? Banana Anaphylaxis  ? Prescott Gum [Fish Allergy] Anaphylaxis  ?  Tuna fish only per pt  ? ? ? ?Review  of Systems ?Constitutional: No recent fever/chills/sweats ?Respiratory: No recent cough/bronchitis ?Cardiovascular: No chest pain ?Gastrointestinal: No recent nausea/vomiting/diarrhea ?Genitourinary: No UTI symptoms ?Hematologic/lymphatic:No history of coagulopathy or recent blood thinner use  ?  ?Objective:  ? ?Blood pressure 112/88, pulse 88, resp. rate 16, height 5\' 2"  (1.575 m), weight 158 lb 6.4 oz (71.8 kg).  Body mass index is 28.97 kg/m?. ? ?CONSTITUTIONAL: Well-developed, well-nourished female in no acute distress.  ?HENT:  Normocephalic, atraumatic, External right and left ear normal. Oropharynx is clear and moist ?EYES: Conjunctivae and EOM are normal. Pupils are equal, round, and reactive to light. No scleral icterus.  ?NECK: Normal range of motion, supple, no masses ?SKIN: Skin is warm and dry. No rash noted. Not diaphoretic. No erythema. No pallor. ?NEUROLOGIC: Alert and oriented to person, place, and time. Normal reflexes, muscle tone coordination. No cranial nerve deficit noted. ?PSYCHIATRIC: Normal mood and affect. Normal behavior. Normal judgment and thought content. ?CARDIOVASCULAR: Normal heart rate noted, regular rhythm ?RESPIRATORY: Effort and breath sounds normal, no problems with respiration noted ?ABDOMEN: Soft, nontender, nondistended. ?PELVIC: Deferred ?MUSCULOSKELETAL: Normal range of motion. No edema and no tenderness. 2+ distal pulses. ? ? ? ?Labs: ?Results for orders placed or performed during the hospital encounter of 03/26/22 (from the past 336 hour(s))  ?RPR  ? Collection Time: 03/26/22 10:53 AM  ?Result Value Ref Range  ? RPR Ser Ql NON REACTIVE NON REACTIVE  ?Rapid HIV screen (HIV 1/2 Ab+Ag)  ? Collection Time: 03/26/22 10:53 AM  ?Result Value Ref Range  ? HIV-1 P24 Antigen - HIV24 NON REACTIVE NON REACTIVE  ? HIV 1/2 Antibodies NON REACTIVE NON REACTIVE  ? Interpretation (HIV Ag Ab)    ?  A non reactive test result means that HIV 1 or HIV 2 antibodies and HIV 1 p24 antigen were  not detected in the specimen.  ?CBC  ? Collection Time: 03/26/22 10:53 AM  ?Result Value Ref Range  ? WBC 6.9 4.0 - 10.5 K/uL  ? RBC 4.59 3.87 - 5.11 MIL/uL  ? Hemoglobin 13.6 12.0 - 15.0 g/dL  ? HCT 41.0 36.0 - 46.0 %  ? MCV 89.3 80.0 - 100.0 fL  ? MCH 29.6 26.0 - 34.0 pg  ? MCHC 33.2 30.0 - 36.0 g/dL  ? RDW 12.8 11.5 - 15.5 %  ? Platelets 319 150 - 400 K/uL  ? nRBC 0.0 0.0 - 0.2 %  ?Comprehensive metabolic panel  ? Collection Time: 03/26/22 10:53 AM  ?Result Value Ref Range  ? Sodium 138 135 - 145 mmol/L  ? Potassium 3.6  3.5 - 5.1 mmol/L  ? Chloride 108 98 - 111 mmol/L  ? CO2 24 22 - 32 mmol/L  ? Glucose, Bld 79 70 - 99 mg/dL  ? BUN 14 6 - 20 mg/dL  ? Creatinine, Ser 0.80 0.44 - 1.00 mg/dL  ? Calcium 8.9 8.9 - 10.3 mg/dL  ? Total Protein 7.4 6.5 - 8.1 g/dL  ? Albumin 4.4 3.5 - 5.0 g/dL  ? AST 14 (L) 15 - 41 U/L  ? ALT 14 0 - 44 U/L  ? Alkaline Phosphatase 45 38 - 126 U/L  ? Total Bilirubin 0.7 0.3 - 1.2 mg/dL  ? GFR, Estimated >60 >60 mL/min  ? Anion gap 6 5 - 15  ?Type and screen  ? Collection Time: 03/26/22 10:53 AM  ?Result Value Ref Range  ? ABO/RH(D) A POS   ? Antibody Screen NEG   ? Sample Expiration 04/09/2022,2359   ? Extend sample reason    ?  NO TRANSFUSIONS OR PREGNANCY IN THE PAST 3 MONTHS ?Performed at [facility], [address] [place], [place] [address] ?  ? ? ? ?Imaging Studies: ?No results found. ? ?Assessment:  ?  ?1. DUB (dysfunctional uterine bleeding)   ?2. History of endometrial ablation   ? ?Plan:  ? ?Counseling: Procedure, risks, reasons, benefits and complications (including injury to bowel, bladder, major blood vessel, ureter, bleeding, possibility of transfusion, infection, or fistula formation) reviewed in detail. Likelihood of success in alleviating the patient's condition was discussed. Routine postoperative instructions will be reviewed with the patient and her family in detail after surgery.  The patient concurred with the proposed plan, giving informed written consent  for the surgery.   ?Preop testing ordered. ?Instructions reviewed, including NPO after midnight. ?  ? ?Joselle Deeds, MD ?[facility] ? ? ? ?

## 2022-04-01 ENCOUNTER — Ambulatory Visit
Admission: RE | Admit: 2022-04-01 | Discharge: 2022-04-01 | Disposition: A | Discharge status: Home / Self Care | Payer: 59 | Attending: Obstetrics and Gynecology | Admitting: Obstetrics and Gynecology

## 2022-04-01 ENCOUNTER — Other Ambulatory Visit: Payer: Self-pay

## 2022-04-01 ENCOUNTER — Ambulatory Visit: Payer: 59 | Admitting: Certified Registered"

## 2022-04-01 ENCOUNTER — Encounter: Payer: Self-pay | Admitting: Obstetrics and Gynecology

## 2022-04-01 ENCOUNTER — Encounter
Admission: RE | Disposition: A | Discharge status: Home / Self Care | Payer: Self-pay | Source: Home / Self Care | Attending: Obstetrics and Gynecology

## 2022-04-01 DIAGNOSIS — N8003 Adenomyosis of the uterus: Secondary | ICD-10-CM | POA: Diagnosis not present

## 2022-04-01 DIAGNOSIS — Z9851 Tubal ligation status: Secondary | ICD-10-CM | POA: Diagnosis not present

## 2022-04-01 DIAGNOSIS — N938 Other specified abnormal uterine and vaginal bleeding: Secondary | ICD-10-CM | POA: Insufficient documentation

## 2022-04-01 DIAGNOSIS — R519 Headache, unspecified: Secondary | ICD-10-CM | POA: Insufficient documentation

## 2022-04-01 DIAGNOSIS — G4730 Sleep apnea, unspecified: Secondary | ICD-10-CM | POA: Diagnosis not present

## 2022-04-01 HISTORY — PX: LAPAROSCOPIC VAGINAL HYSTERECTOMY WITH SALPINGECTOMY: SHX6680

## 2022-04-01 LAB — POCT PREGNANCY, URINE: Preg Test, Ur: NEGATIVE

## 2022-04-01 SURGERY — HYSTERECTOMY, VAGINAL, LAPAROSCOPY-ASSISTED, WITH SALPINGECTOMY
Anesthesia: General | Site: Abdomen

## 2022-04-01 MED ORDER — KETOROLAC TROMETHAMINE 30 MG/ML IJ SOLN
INTRAMUSCULAR | Status: AC
  Filled 2022-04-01: qty 1

## 2022-04-01 MED ORDER — ONDANSETRON HCL 4 MG/2ML IJ SOLN
INTRAMUSCULAR | Status: AC
  Filled 2022-04-01: qty 2

## 2022-04-01 MED ORDER — DEXAMETHASONE SODIUM PHOSPHATE 10 MG/ML IJ SOLN
INTRAMUSCULAR | Status: AC
  Filled 2022-04-01: qty 1

## 2022-04-01 MED ORDER — GABAPENTIN 300 MG PO CAPS
300.0000 mg | ORAL_CAPSULE | ORAL | Status: AC
  Administered 2022-04-01: 300 mg via ORAL

## 2022-04-01 MED ORDER — PHENYLEPHRINE 80 MCG/ML (10ML) SYRINGE FOR IV PUSH (FOR BLOOD PRESSURE SUPPORT)
PREFILLED_SYRINGE | INTRAVENOUS | Status: AC
  Filled 2022-04-01: qty 10

## 2022-04-01 MED ORDER — IBUPROFEN 600 MG PO TABS: 600.0000 mg | ORAL_TABLET | Freq: Four times a day (QID) | ORAL | 1 refills | Status: DC | PRN

## 2022-04-01 MED ORDER — HYDROMORPHONE HCL 1 MG/ML IJ SOLN
INTRAMUSCULAR | Status: AC
  Administered 2022-04-01: 0.5 mg via INTRAVENOUS
  Filled 2022-04-01: qty 1

## 2022-04-01 MED ORDER — OXYCODONE HCL 5 MG PO TABS
5.0000 mg | ORAL_TABLET | Freq: Once | ORAL | Status: AC | PRN
  Administered 2022-04-01: 5 mg via ORAL

## 2022-04-01 MED ORDER — FENTANYL CITRATE (PF) 100 MCG/2ML IJ SOLN
INTRAMUSCULAR | Status: AC
  Administered 2022-04-01: 25 ug via INTRAVENOUS
  Filled 2022-04-01: qty 2

## 2022-04-01 MED ORDER — FENTANYL CITRATE (PF) 100 MCG/2ML IJ SOLN
INTRAMUSCULAR | Status: DC | PRN
Start: 2022-04-01 — End: 2022-04-01
  Administered 2022-04-01 (×2): 50 ug via INTRAVENOUS

## 2022-04-01 MED ORDER — PROPOFOL 10 MG/ML IV BOLUS
INTRAVENOUS | Status: DC | PRN
  Administered 2022-04-01: 140 mg via INTRAVENOUS

## 2022-04-01 MED ORDER — ROCURONIUM BROMIDE 10 MG/ML (PF) SYRINGE
PREFILLED_SYRINGE | INTRAVENOUS | Status: AC
  Filled 2022-04-01: qty 10

## 2022-04-01 MED ORDER — FENTANYL CITRATE (PF) 100 MCG/2ML IJ SOLN
25.0000 ug | INTRAMUSCULAR | Status: AC | PRN
  Administered 2022-04-01 (×2): 25 ug via INTRAVENOUS

## 2022-04-01 MED ORDER — ROCURONIUM BROMIDE 100 MG/10ML IV SOLN
INTRAVENOUS | Status: DC | PRN
  Administered 2022-04-01: 30 mg via INTRAVENOUS
  Administered 2022-04-01: 50 mg via INTRAVENOUS
  Administered 2022-04-01: 5 mg via INTRAVENOUS

## 2022-04-01 MED ORDER — KETOROLAC TROMETHAMINE 30 MG/ML IJ SOLN
INTRAMUSCULAR | Status: DC | PRN
  Administered 2022-04-01: 30 mg via INTRAVENOUS

## 2022-04-01 MED ORDER — FENTANYL CITRATE (PF) 100 MCG/2ML IJ SOLN
25.0000 ug | INTRAMUSCULAR | Status: AC | PRN
  Administered 2022-04-01 (×4): 25 ug via INTRAVENOUS

## 2022-04-01 MED ORDER — HYDROMORPHONE HCL 1 MG/ML IJ SOLN
INTRAMUSCULAR | Status: AC
  Administered 2022-04-01: 0.25 mg via INTRAVENOUS
  Filled 2022-04-01: qty 1

## 2022-04-01 MED ORDER — LIDOCAINE HCL (PF) 2 % IJ SOLN
INTRAMUSCULAR | Status: AC
  Filled 2022-04-01: qty 5

## 2022-04-01 MED ORDER — ONDANSETRON HCL 4 MG/2ML IJ SOLN
4.0000 mg | Freq: Once | INTRAMUSCULAR | Status: AC
Start: 2022-04-01 — End: 2022-04-01
  Administered 2022-04-01: 4 mg via INTRAVENOUS

## 2022-04-01 MED ORDER — LIDOCAINE HCL (CARDIAC) PF 100 MG/5ML IV SOSY
PREFILLED_SYRINGE | INTRAVENOUS | Status: DC | PRN
Start: 2022-04-01 — End: 2022-04-01
  Administered 2022-04-01: 90 mg via INTRAVENOUS

## 2022-04-01 MED ORDER — CHLORHEXIDINE GLUCONATE 0.12 % MT SOLN
15.0000 mL | Freq: Once | OROMUCOSAL | Status: AC
  Administered 2022-04-01: 15 mL via OROMUCOSAL

## 2022-04-01 MED ORDER — CEFAZOLIN SODIUM-DEXTROSE 2-4 GM/100ML-% IV SOLN
2.0000 g | INTRAVENOUS | Status: AC
  Administered 2022-04-01: 2 g via INTRAVENOUS

## 2022-04-01 MED ORDER — MIDAZOLAM HCL 2 MG/2ML IJ SOLN
INTRAMUSCULAR | Status: DC | PRN
  Administered 2022-04-01: 2 mg via INTRAVENOUS

## 2022-04-01 MED ORDER — FAMOTIDINE 20 MG PO TABS
ORAL_TABLET | ORAL | Status: AC
  Filled 2022-04-01: qty 1

## 2022-04-01 MED ORDER — FENTANYL CITRATE (PF) 100 MCG/2ML IJ SOLN
INTRAMUSCULAR | Status: AC
  Filled 2022-04-01: qty 2

## 2022-04-01 MED ORDER — 0.9 % SODIUM CHLORIDE (POUR BTL) OPTIME
TOPICAL | Status: DC | PRN
  Administered 2022-04-01: 1000 mL

## 2022-04-01 MED ORDER — ACETAMINOPHEN 500 MG PO TABS
ORAL_TABLET | ORAL | Status: AC
  Filled 2022-04-01: qty 2

## 2022-04-01 MED ORDER — PHENYLEPHRINE HCL (PRESSORS) 10 MG/ML IV SOLN
INTRAVENOUS | Status: DC | PRN
  Administered 2022-04-01 (×6): 80 ug via INTRAVENOUS
  Administered 2022-04-01: 40 ug via INTRAVENOUS
  Administered 2022-04-01: 120 ug via INTRAVENOUS

## 2022-04-01 MED ORDER — MIDAZOLAM HCL 2 MG/2ML IJ SOLN
INTRAMUSCULAR | Status: AC
  Filled 2022-04-01: qty 2

## 2022-04-01 MED ORDER — OXYCODONE-ACETAMINOPHEN 5-325 MG PO TABS: 1.0000 | ORAL_TABLET | Freq: Four times a day (QID) | ORAL | 0 refills | Status: DC | PRN

## 2022-04-01 MED ORDER — GABAPENTIN 300 MG PO CAPS
ORAL_CAPSULE | ORAL | Status: AC
  Filled 2022-04-01: qty 1

## 2022-04-01 MED ORDER — LACTATED RINGERS IV SOLN: INTRAVENOUS | Status: DC

## 2022-04-01 MED ORDER — GLYCOPYRROLATE 0.2 MG/ML IJ SOLN
INTRAMUSCULAR | Status: AC
  Filled 2022-04-01: qty 1

## 2022-04-01 MED ORDER — HYDROMORPHONE HCL 1 MG/ML IJ SOLN
INTRAMUSCULAR | Status: DC
Start: 2022-04-01 — End: 2022-04-01
  Filled 2022-04-01: qty 1

## 2022-04-01 MED ORDER — ONDANSETRON HCL 4 MG/2ML IJ SOLN
INTRAMUSCULAR | Status: DC | PRN
  Administered 2022-04-01: 4 mg via INTRAVENOUS

## 2022-04-01 MED ORDER — OXYCODONE HCL 5 MG/5ML PO SOLN: 5.0000 mg | Freq: Once | ORAL | Status: AC | PRN

## 2022-04-01 MED ORDER — FAMOTIDINE 20 MG PO TABS
20.0000 mg | ORAL_TABLET | Freq: Once | ORAL | Status: AC
  Administered 2022-04-01: 20 mg via ORAL

## 2022-04-01 MED ORDER — OXYCODONE HCL 5 MG PO TABS
ORAL_TABLET | ORAL | Status: AC
  Filled 2022-04-01: qty 1

## 2022-04-01 MED ORDER — DEXAMETHASONE SODIUM PHOSPHATE 10 MG/ML IJ SOLN
INTRAMUSCULAR | Status: DC | PRN
  Administered 2022-04-01: 10 mg via INTRAVENOUS

## 2022-04-01 MED ORDER — ACETAMINOPHEN 500 MG PO TABS
1000.0000 mg | ORAL_TABLET | ORAL | Status: AC
  Administered 2022-04-01: 1000 mg via ORAL

## 2022-04-01 MED ORDER — POVIDONE-IODINE 10 % EX SWAB: 2.0000 "application " | Freq: Once | CUTANEOUS | Status: DC

## 2022-04-01 MED ORDER — CEFAZOLIN SODIUM-DEXTROSE 2-4 GM/100ML-% IV SOLN
INTRAVENOUS | Status: AC
  Filled 2022-04-01: qty 100

## 2022-04-01 MED ORDER — HYDROMORPHONE HCL 1 MG/ML IJ SOLN
0.2500 mg | INTRAMUSCULAR | Status: DC | PRN
  Administered 2022-04-01: 0.25 mg via INTRAVENOUS
  Administered 2022-04-01: 0.5 mg via INTRAVENOUS
  Administered 2022-04-01 (×2): 0.25 mg via INTRAVENOUS

## 2022-04-01 MED ORDER — SODIUM CHLORIDE (PF) 0.9 % IJ SOLN
INTRAMUSCULAR | Status: AC
  Filled 2022-04-01: qty 50

## 2022-04-01 MED ORDER — BUPIVACAINE HCL 0.5 % IJ SOLN
INTRAMUSCULAR | Status: DC | PRN
  Administered 2022-04-01: 15 mL

## 2022-04-01 MED ORDER — PROPOFOL 10 MG/ML IV BOLUS
INTRAVENOUS | Status: AC
  Filled 2022-04-01: qty 60

## 2022-04-01 MED ORDER — SUGAMMADEX SODIUM 200 MG/2ML IV SOLN
INTRAVENOUS | Status: DC | PRN
  Administered 2022-04-01: 140.6 mg via INTRAVENOUS

## 2022-04-01 MED ORDER — VASOPRESSIN 20 UNIT/ML IV SOLN
INTRAVENOUS | Status: AC
  Filled 2022-04-01: qty 1

## 2022-04-01 MED ORDER — CHLORHEXIDINE GLUCONATE 0.12 % MT SOLN
OROMUCOSAL | Status: AC
  Filled 2022-04-01: qty 15

## 2022-04-01 MED ORDER — VASOPRESSIN 20 UNIT/ML IV SOLN
INTRAVENOUS | Status: DC | PRN
  Administered 2022-04-01: 20 mL via INTRAMUSCULAR

## 2022-04-01 MED ORDER — BUPIVACAINE HCL (PF) 0.5 % IJ SOLN
INTRAMUSCULAR | Status: AC
  Filled 2022-04-01: qty 30

## 2022-04-01 MED ORDER — ORAL CARE MOUTH RINSE: 15.0000 mL | Freq: Once | OROMUCOSAL | Status: AC

## 2022-04-01 SURGICAL SUPPLY — 67 items
ADH SKN CLS APL DERMABOND .7 (GAUZE/BANDAGES/DRESSINGS) ×1
APL PRP STRL LF DISP 70% ISPRP (MISCELLANEOUS) ×1
BACTOSHIELD CHG 4% 4OZ (MISCELLANEOUS) ×1
BAG DRN RND TRDRP ANRFLXCHMBR (UROLOGICAL SUPPLIES) ×1
BAG URINE DRAIN 2000ML AR STRL (UROLOGICAL SUPPLIES) ×2 IMPLANT
BLADE SURG 15 STRL LF DISP TIS (BLADE) ×1 IMPLANT
BLADE SURG 15 STRL SS (BLADE) ×2
BLADE SURG SZ10 CARB STEEL (BLADE) ×2 IMPLANT
BLADE SURG SZ11 CARB STEEL (BLADE) ×2 IMPLANT
CATH FOLEY 2WAY  5CC 16FR (CATHETERS) ×2
CATH FOLEY 2WAY 5CC 16FR (CATHETERS) ×1
CATH URTH 16FR FL 2W BLN LF (CATHETERS) ×1 IMPLANT
CHLORAPREP W/TINT 26 (MISCELLANEOUS) ×2 IMPLANT
DERMABOND ADVANCED (GAUZE/BANDAGES/DRESSINGS) ×1
DERMABOND ADVANCED .7 DNX12 (GAUZE/BANDAGES/DRESSINGS) ×1 IMPLANT
ELECT REM PT RETURN 9FT ADLT (ELECTROSURGICAL) ×2
ELECTRODE REM PT RTRN 9FT ADLT (ELECTROSURGICAL) ×1 IMPLANT
GAUZE 4X4 16PLY ~~LOC~~+RFID DBL (SPONGE) ×6 IMPLANT
GAUZE PACK 2X3YD (PACKING) ×2 IMPLANT
GLOVE BIO SURGEON STRL SZ 6.5 (GLOVE) ×2 IMPLANT
GLOVE BIO SURGEON STRL SZ8 (GLOVE) ×4 IMPLANT
GLOVE BIOGEL PI ORTHO PRO 7.5 (GLOVE) ×1
GLOVE PI ORTHO PRO STRL 7.5 (GLOVE) ×1 IMPLANT
GLOVE SURG POLY ORTHO LF SZ7.5 (GLOVE) ×2 IMPLANT
GLOVE SURG UNDER LTX SZ7 (GLOVE) ×4 IMPLANT
GOWN STRL REUS W/ TWL LRG LVL3 (GOWN DISPOSABLE) ×2 IMPLANT
GOWN STRL REUS W/ TWL XL LVL3 (GOWN DISPOSABLE) ×1 IMPLANT
GOWN STRL REUS W/TWL LRG LVL3 (GOWN DISPOSABLE) ×4
GOWN STRL REUS W/TWL XL LVL3 (GOWN DISPOSABLE) ×2
IRRIGATION STRYKERFLOW (MISCELLANEOUS) ×1 IMPLANT
IRRIGATOR STRYKERFLOW (MISCELLANEOUS)
IV LACTATED RINGERS 1000ML (IV SOLUTION) ×2 IMPLANT
KIT PINK PAD W/HEAD ARE REST (MISCELLANEOUS) ×2
KIT PINK PAD W/HEAD ARM REST (MISCELLANEOUS) ×1 IMPLANT
KIT TURNOVER CYSTO (KITS) ×2 IMPLANT
LABEL OR SOLS (LABEL) ×2 IMPLANT
LIGASURE LAP MARYLAND 5MM 37CM (ELECTROSURGICAL) ×2 IMPLANT
MANIFOLD NEPTUNE II (INSTRUMENTS) ×2 IMPLANT
MANIPULATOR VCARE LG CRV RETR (MISCELLANEOUS) IMPLANT
MANIPULATOR VCARE SML CRV RETR (MISCELLANEOUS) IMPLANT
MANIPULATOR VCARE STD CRV RETR (MISCELLANEOUS) IMPLANT
NDL SPNL 22GX3.5 QUINCKE BK (NEEDLE) ×1 IMPLANT
NEEDLE SPNL 22GX3.5 QUINCKE BK (NEEDLE) ×2 IMPLANT
PACK BASIN MINOR ARMC (MISCELLANEOUS) ×2 IMPLANT
PACK GYN LAPAROSCOPIC (MISCELLANEOUS) ×2 IMPLANT
PAD OB MATERNITY 4.3X12.25 (PERSONAL CARE ITEMS) ×2 IMPLANT
SCISSORS METZENBAUM CVD 33 (INSTRUMENTS) ×1 IMPLANT
SCRUB CHG 4% DYNA-HEX 4OZ (MISCELLANEOUS) ×1 IMPLANT
SHEARS HARMONIC ACE PLUS 36CM (ENDOMECHANICALS) IMPLANT
SLEEVE ENDOPATH XCEL 5M (ENDOMECHANICALS) ×4 IMPLANT
STRIP CLOSURE SKIN 1/2X4 (GAUZE/BANDAGES/DRESSINGS) ×2 IMPLANT
SUT MNCRL 4-0 (SUTURE) ×2
SUT MNCRL 4-0 27XMFL (SUTURE) ×1
SUT VIC AB 0 CT1 27 (SUTURE) ×2
SUT VIC AB 0 CT1 27XCR 8 STRN (SUTURE) ×1 IMPLANT
SUT VIC AB 0 CT1 36 (SUTURE) ×2 IMPLANT
SUT VIC AB 0 CT2 27 (SUTURE) ×2 IMPLANT
SUT VIC AB 2-0 CT1 (SUTURE) ×2 IMPLANT
SUT VIC AB 4-0 FS2 27 (SUTURE) ×2 IMPLANT
SUTURE MNCRL 4-0 27XMF (SUTURE) ×1 IMPLANT
SYR 10ML LL (SYRINGE) ×2 IMPLANT
SYR 50ML LL SCALE MARK (SYRINGE) IMPLANT
SYR CONTROL 10ML LL (SYRINGE) ×2 IMPLANT
TAPE TRANSPORE STRL 2 31045 (GAUZE/BANDAGES/DRESSINGS) ×2 IMPLANT
TROCAR XCEL NON-BLD 5MMX100MML (ENDOMECHANICALS) ×2 IMPLANT
TUBING EVAC SMOKE HEATED PNEUM (TUBING) ×2 IMPLANT
WATER STERILE IRR 500ML POUR (IV SOLUTION) ×2 IMPLANT

## 2022-04-01 NOTE — Op Note (Signed)
Procedure(s): ?LAPAROSCOPIC ASSISTED VAGINAL HYSTERECTOMY WITH SALPINGECTOMY Procedure Note ? ?Sarah Eaton ?female ?36 y.o. ?04/01/2022 ? ?Indications: The patient is a 36 y.o. [id number] female with dysfunctional uterine bleeding. History of endometrial ablation. History of tubal ligation. Desires definitive management.  ? ?Pre-operative Diagnosis: Dysfunctional uterine bleeding. History of endometrial ablation.  ? ?Post-operative Diagnosis: Same ? ?Surgeon: Hildred Laser, MD ? ?Assistants:  Brennan Bailey, MD.  ? ?Anesthesia: General endotracheal anesthesia ? ?Findings: ?The uterus was sounded to 8 cm.  Stenotic cervix from previous endometrial ablation.  ?Fallopian tubes and ovaries appeared normal. Fallopian tubes previously surgically interrupted.  Filschie clip present on right tube.  ? ?Procedure Details: ?The patient was seen in the Holding Room. The risks, benefits, complications, treatment options, and expected outcomes were discussed with the patient.  The patient concurred with the proposed plan, giving informed consent.  The site of surgery properly noted/marked. The patient was taken to the Operating Room, identified as Sarah Eaton and the procedure verified as Procedure(s) (LRB): LAPAROSCOPIC ASSISTED VAGINAL HYSTERECTOMY WITH SALPINGECTOMY (N/A). ? ?She was then placed under general anesthesia without difficulty. She was placed in the dorsal lithotomy position, and was prepped and draped in a sterile manner.  A Time Out was held and the above information confirmed.  A straight catheterization was performed. A sterile speculum was inserted into the vagina and the cervix was grasped at the anterior lip using a single-toothed tenaculum.  The uterus was sounded to 8 cm, and a Hulka clamp was placed for uterine manipulation.  The speculum and tenaculum were then removed. Attention was turned to the abdomen where an umbilical incision was made with the scalpel.  The Optiview 5-mm trocar and sleeve were then  advanced without difficulty with the laparoscope under direct visualization into the abdomen.  The abdomen was then insufflated with carbon dioxide gas and adequate pneumoperitoneum was obtained. Bilateral 5-mm lower quadrant ports were then placed under direct visualization.  A survey of the patient's pelvis and abdomen revealed the findings as above.  Attention was turned to the mesosalpinx of the right fallopian tube, which was clamped using the Ligasure scalpel and ligated. The utero-ovarian ligament and the round ligament were also ligated with the Ligasure scalpel working towards the broad ligament.  The round and broad ligaments were then clamped and transected with the Ligasure scalpel.  The ureter were noted to be safely away from the area of dissection.  The uterine artery was then skeletonized and a bladder flap was created.  The bladder was then bluntly dissected off the lower uterine segment.  At this point, attention was turned to the right uterine vessels, which were clamped and ligated using the Ligasure scalpel.  Attention was then turned to the patient's left side, which was treated in a similar manner by taking the infundibulopelvic ligament, the round ligament and the broad ligaments and the bladder flap creation was completed. The left uterine vessels were also clamped and transected in a similar fashion.  A final survey of the operative site was performed, and hemostasis was noted throughout. No intraoperative injury to other surrounding organs was noted.  The abdomen was desufflated and all instruments were then removed from the patient's abdomen.   All skin incisions were injected with a total of 15 ml of 0.5% Marcaine and closed with 4-0 Monocryl.  The incisions were covered with Dermabond. The decision was made to proceed with completing the hysterectomy via the vaginal route . ? ?Attention was then turned to  her pelvis.  A weighted speculum was then placed in the vagina, and the anterior  and posterior lips of the cervix were grasped bilaterally with tenaculums.  The cervix was then injected circumferentially with 20 ml of Vasopressin solution to maintain hemostasis.  The cervix was then circumferentially incised, and the posterior cul-de-sac was entered sharply without difficulty.  A long weighted speculum was inserted into the posterior cul-de-sac. The Heaney clamp was then used to clamp the uterosacral ligaments on either side.  They were then cut and sutured ligated with 0 Vicryl, and were held with a tag for later identification. Of note, all sutures used in this case were 0 Vicryl unless otherwise noted.   The cardinal ligaments were then clamped, cut and ligated bilaterally.  At this point, full entry into the anterior cul-de-sac was made, no injury to the bladder was noted.  The uterus was freed from all ligaments and was then delivered and sent to pathology. A filschie clip was  noted in the posterior cul-de-sac, removed with forceps.  A 0-Vicryl suture was then used to close the angles of the vagina bilaterally, with care given to incorporate the uterosacral pedicles bilaterally.  Next, a 0 Vicryl suture was used to close the peritoneum of the vaginal cuff in a pursestring fashion.  The vaginal cuff was then closed with a series of figure-of-eight sutures using 0 Vicryl.  All instruments were then removed from the pelvis ? ?The patient tolerated the procedures well.  All instruments, needles, and sponge counts were correct  times three. The patient was taken to the recovery room awake, extubated and in stable condition.  ? ? ?Estimated Blood Loss:  125 ml ?     ?Drains: foley cather inserted to gravity drainage, 200 ml of clear urine at end of procedure. ?        ?Total IV Fluids:  1000 ml ? ?Specimens: Uterus with cervix, bilateral fallopian tubes ?        ?Implants: None ?        ?Complications:  None; patient tolerated the procedure well. ?        ?Disposition: PACU - hemodynamically  stable. ?        ?Condition: stable ? ? ?Hildred Laser, MD ?[facility] ? ? ?

## 2022-04-01 NOTE — Anesthesia Preprocedure Evaluation (Signed)
Anesthesia Evaluation  ?Patient identified by MRN, date of birth, ID band ?Patient awake ? ? ? ?Reviewed: ?Allergy & Precautions, NPO status , Patient's Chart, lab work & pertinent test results ? ?Airway ?Mallampati: III ? ?TM Distance: >3 FB ?Neck ROM: full ? ? ? Dental ? ?(+) Chipped ?  ?Pulmonary ?sleep apnea ,  ?  ?Pulmonary exam normal ? ? ? ? ? ? ? Cardiovascular ?hypertension, Normal cardiovascular exam ? ? ?  ?Neuro/Psych ? Headaches, negative psych ROS  ? GI/Hepatic ?Neg liver ROS, GERD  Controlled,  ?Endo/Other  ?negative endocrine ROS ? Renal/GU ?  ? ?  ?Musculoskeletal ? ? Abdominal ?  ?Peds ? Hematology ?negative hematology ROS ?(+)   ?Anesthesia Other Findings ?Past Medical History: ?No date: Anemia ?No date: Hypertension ?No date: Obesity ?No date: Ovarian cyst ?05/04/2021: Pyelonephritis ?05/04/2021: Sepsis due to Escherichia coli (E. coli) (HCC) ? ?Past Surgical History: ?No date: CESAREAN SECTION ?08/05/2013: CESAREAN SECTION WITH BILATERAL TUBAL LIGATION; Bilateral ?    Comment:  Procedure: REPEAT CESAREAN SECTION WITH BILATERAL TUBAL  ?             LIGATION;  Surgeon: Allie Bossier, MD;  Location: [facility];   ?             Service: Obstetrics;  Laterality: Bilateral; ?09/2014: ENDOMETRIAL ABLATION W/ NOVASURE ?No date: TUBAL LIGATION ? ?BMI   ? Body Mass Index: 28.35 kg/m?  ?  ? ? Reproductive/Obstetrics ?negative OB ROS ? ?  ? ? ? ? ? ? ? ? ? ? ? ? ? ?  ?  ? ? ? ? ? ? ? ? ?Anesthesia Physical ?Anesthesia Plan ? ?ASA: 3 ? ?Anesthesia Plan: General ETT  ? ?Post-op Pain Management:   ? ?Induction: Intravenous ? ?PONV Risk Score and Plan: Ondansetron, Dexamethasone, Midazolam and Treatment may vary due to age or medical condition ? ?Airway Management Planned: Oral ETT ? ?Additional Equipment:  ? ?Intra-op Plan:  ? ?Post-operative Plan: Extubation in OR ? ?Informed Consent: I have reviewed the patients History and Physical, chart, labs and discussed the procedure  including the risks, benefits and alternatives for the proposed anesthesia with the patient or authorized representative who has indicated his/her understanding and acceptance.  ? ? ? ?Dental Advisory Given ? ?Plan Discussed with: Anesthesiologist, CRNA and Surgeon ? ?Anesthesia Plan Comments: (Patient consented for risks of anesthesia including but not limited to:  ?- adverse reactions to medications ?- damage to eyes, teeth, lips or other oral mucosa ?- nerve damage due to positioning  ?- sore throat or hoarseness ?- Damage to heart, brain, nerves, lungs, other parts of body or loss of life ? ?Patient voiced understanding.)  ? ? ? ? ? ? ?Anesthesia Quick Evaluation ? ?

## 2022-04-01 NOTE — Discharge Instructions (Signed)

## 2022-04-01 NOTE — Interval H&P Note (Signed)
History and Physical Interval Note: ? ?04/01/2022 ?7:23 AM ? ?Sarah Eaton  has presented today for surgery, with the diagnosis of dysfunctional uterine bleeding, S/P endometrial ablation.  The various methods of treatment have been discussed with the patient and family. After consideration of risks, benefits and other options for treatment, the patient has consented to  Procedure(s): LAPAROSCOPIC ASSISTED VAGINAL HYSTERECTOMY WITH SALPINGECTOMY (Bilateral) as a surgical intervention.  The patient's history has been reviewed, patient examined, no change in status, stable for surgery.  I have reviewed the patient's chart and labs.  Questions were answered to the patient's satisfaction.   ? ? ?Rubie Maid, MD ?[facility] ? ? ?

## 2022-04-01 NOTE — Anesthesia Procedure Notes (Signed)
Procedure Name: Intubation ?Date/Time: 04/01/2022 7:35 AM ?Performed by: Demetrius Charity, CRNA ?Pre-anesthesia Checklist: Patient identified, Patient being monitored, Timeout performed, Emergency Drugs available and Suction available ?Patient Re-evaluated:Patient Re-evaluated prior to induction ?Oxygen Delivery Method: Circle system utilized ?Preoxygenation: Pre-oxygenation with 100% oxygen ?Induction Type: IV induction ?Ventilation: Mask ventilation without difficulty ?Laryngoscope Size: 3 and McGraph ?Grade View: Grade I ?Tube type: Oral ?Tube size: 7.0 mm ?Number of attempts: 1 ?Airway Equipment and Method: Stylet and Video-laryngoscopy ?Placement Confirmation: ETT inserted through vocal cords under direct vision, positive ETCO2 and breath sounds checked- equal and bilateral ?Secured at: 21 cm ?Tube secured with: Tape ?Dental Injury: Teeth and Oropharynx as per pre-operative assessment  ? ? ? ? ?

## 2022-04-01 NOTE — Progress Notes (Signed)
?   04/01/22 0700  ?Clinical Encounter Type  ?Visited With Patient and family together  ?Visit Type Pre-op  ?Spiritual Encounters  ?Spiritual Needs Prayer  ? ?Chaplain provided pre-op support through prayer ?

## 2022-04-01 NOTE — Transfer of Care (Signed)
Immediate Anesthesia Transfer of Care Note ? ?Patient: Sarah Eaton ? ?Procedure(s) Performed: LAPAROSCOPIC ASSISTED VAGINAL HYSTERECTOMY WITH SALPINGECTOMY (Abdomen) ? ?Patient Location: PACU ? ?Anesthesia Type:General ? ?Level of Consciousness: drowsy ? ?Airway & Oxygen Therapy: Patient Spontanous Breathing and Patient connected to face mask oxygen ? ?Post-op Assessment: Report given to RN and Post -op Vital signs reviewed and stable ? ?Post vital signs: Reviewed and stable ? ?Last Vitals:  ?Vitals Value Taken Time  ?BP 133/93 04/01/22 0957  ?Temp    ?Pulse 91 04/01/22 1000  ?Resp 24 04/01/22 1000  ?SpO2 100 % 04/01/22 1000  ?Vitals shown include unvalidated device data. ? ?Last Pain:  ?Vitals:  ? 04/01/22 0648  ?TempSrc: Temporal  ?PainSc:   ?   ? ?  ? ?Complications: No notable events documented. ?

## 2022-04-02 ENCOUNTER — Encounter: Payer: Self-pay | Admitting: Obstetrics and Gynecology

## 2022-04-02 ENCOUNTER — Other Ambulatory Visit: Payer: Self-pay

## 2022-04-02 LAB — SURGICAL PATHOLOGY

## 2022-04-02 MED ORDER — ONDANSETRON 4 MG PO TBDP: 4.0000 mg | ORAL_TABLET | Freq: Four times a day (QID) | ORAL | 0 refills | Status: DC | PRN

## 2022-04-02 NOTE — Anesthesia Postprocedure Evaluation (Signed)
Anesthesia Post Note ? ?Patient: VIRGILENE STRYKER ? ?Procedure(s) Performed: LAPAROSCOPIC ASSISTED VAGINAL HYSTERECTOMY WITH SALPINGECTOMY (Abdomen) ? ?Patient location during evaluation: PACU ?Anesthesia Type: General ?Level of consciousness: awake and alert ?Pain management: pain level controlled ?Vital Signs Assessment: post-procedure vital signs reviewed and stable ?Respiratory status: spontaneous breathing, nonlabored ventilation and respiratory function stable ?Cardiovascular status: blood pressure returned to baseline and stable ?Postop Assessment: no apparent nausea or vomiting ?Anesthetic complications: no ? ? ?No notable events documented. ? ? ?Last Vitals:  ?Vitals:  ? 04/01/22 1200 04/01/22 1212  ?BP: 120/83 126/89  ?Pulse: 97 75  ?Resp: 20 16  ?Temp:  (!) 36.3 ?C  ?SpO2: 96% 100%  ?  ?Last Pain:  ?Vitals:  ? 04/01/22 1212  ?TempSrc: Temporal  ?PainSc: 7   ? ? ?  ?  ?  ?  ?  ?  ? ?Foye Deer ? ? ? ? ?

## 2022-04-03 ENCOUNTER — Encounter: Payer: Self-pay | Admitting: Obstetrics and Gynecology

## 2022-04-03 ENCOUNTER — Other Ambulatory Visit: Payer: Self-pay | Admitting: Obstetrics and Gynecology

## 2022-04-04 MED ORDER — HYDROMORPHONE HCL 2 MG PO TABS: 2.0000 mg | ORAL_TABLET | ORAL | 0 refills | Status: DC | PRN

## 2022-04-13 ENCOUNTER — Encounter: Payer: Self-pay | Admitting: Emergency Medicine

## 2022-04-13 ENCOUNTER — Other Ambulatory Visit: Payer: Self-pay

## 2022-04-13 ENCOUNTER — Emergency Department: Payer: 59

## 2022-04-13 DIAGNOSIS — Z794 Long term (current) use of insulin: Secondary | ICD-10-CM | POA: Diagnosis not present

## 2022-04-13 DIAGNOSIS — W01198A Fall on same level from slipping, tripping and stumbling with subsequent striking against other object, initial encounter: Secondary | ICD-10-CM | POA: Insufficient documentation

## 2022-04-13 DIAGNOSIS — I10 Essential (primary) hypertension: Secondary | ICD-10-CM | POA: Insufficient documentation

## 2022-04-13 DIAGNOSIS — S3991XA Unspecified injury of abdomen, initial encounter: Secondary | ICD-10-CM | POA: Diagnosis present

## 2022-04-13 DIAGNOSIS — S301XXA Contusion of abdominal wall, initial encounter: Secondary | ICD-10-CM | POA: Insufficient documentation

## 2022-04-13 DIAGNOSIS — N939 Abnormal uterine and vaginal bleeding, unspecified: Secondary | ICD-10-CM | POA: Diagnosis not present

## 2022-04-13 DIAGNOSIS — Y92000 Kitchen of unspecified non-institutional (private) residence as  the place of occurrence of the external cause: Secondary | ICD-10-CM | POA: Diagnosis not present

## 2022-04-13 LAB — COMPREHENSIVE METABOLIC PANEL
ALT: 18 U/L (ref 0–44)
AST: 15 U/L (ref 15–41)
Albumin: 4.6 g/dL (ref 3.5–5.0)
Alkaline Phosphatase: 55 U/L (ref 38–126)
Anion gap: 7 (ref 5–15)
BUN: 18 mg/dL (ref 6–20)
CO2: 25 mmol/L (ref 22–32)
Calcium: 9 mg/dL (ref 8.9–10.3)
Chloride: 108 mmol/L (ref 98–111)
Creatinine, Ser: 0.9 mg/dL (ref 0.44–1.00)
GFR, Estimated: >60 mL/min (ref >60)
Glucose, Bld: 92 mg/dL (ref 70–99)
Potassium: 3.6 mmol/L (ref 3.5–5.1)
Sodium: 140 mmol/L (ref 135–145)
Total Bilirubin: 0.6 mg/dL (ref 0.3–1.2)
Total Protein: 7.9 g/dL (ref 6.5–8.1)

## 2022-04-13 LAB — CBC
HCT: 42.4 % (ref 36.0–46.0)
Hemoglobin: 13.9 g/dL (ref 12.0–15.0)
MCH: 30 pg (ref 26.0–34.0)
MCHC: 32.8 g/dL (ref 30.0–36.0)
MCV: 91.4 fL (ref 80.0–100.0)
Platelets: 498 10*3/uL — ABNORMAL HIGH (ref 150–400)
RBC: 4.64 MIL/uL (ref 3.87–5.11)
RDW: 12.8 % (ref 11.5–15.5)
WBC: 10.2 10*3/uL (ref 4.0–10.5)
nRBC: 0 % (ref 0.0–0.2)

## 2022-04-13 LAB — LIPASE, BLOOD: Lipase: 33 U/L (ref 11–51)

## 2022-04-13 MED ORDER — IOHEXOL 350 MG/ML SOLN
80.0000 mL | Freq: Once | INTRAVENOUS | Status: AC | PRN
  Administered 2022-04-13: 80 mL via INTRAVENOUS

## 2022-04-13 NOTE — ED Triage Notes (Signed)
  Patient comes in with left lower abdominal pain and vaginal bleeding that started yesterday.  Patient states she had hysterectomy on 5/15 and was moving some stuff around in the kitchen yesterday when she fell over the back of a chair.  Patient states she had immediate sharp pain and felt nauseous.  Noticed some vaginal bleeding last night before bed and had an increase in bleeding today.  Patient also endorses pelvic pressure.  Pain 8/10, sharp pain in LLQ. Ibuprofen 600 mg taken earlier this morning.

## 2022-04-14 ENCOUNTER — Emergency Department
Admission: EM | Admit: 2022-04-14 | Discharge: 2022-04-14 | Disposition: A | Discharge status: Home / Self Care | Payer: 59 | Attending: Emergency Medicine | Admitting: Emergency Medicine

## 2022-04-14 DIAGNOSIS — R1032 Left lower quadrant pain: Secondary | ICD-10-CM

## 2022-04-14 DIAGNOSIS — S301XXA Contusion of abdominal wall, initial encounter: Secondary | ICD-10-CM

## 2022-04-14 MED ORDER — OXYCODONE-ACETAMINOPHEN 5-325 MG PO TABS: 1.0000 | ORAL_TABLET | ORAL | 0 refills | Status: DC | PRN

## 2022-04-14 MED ORDER — ONDANSETRON 4 MG PO TBDP: 4.0000 mg | ORAL_TABLET | Freq: Three times a day (TID) | ORAL | 0 refills | Status: DC | PRN

## 2022-04-14 MED ORDER — OXYCODONE-ACETAMINOPHEN 5-325 MG PO TABS
1.0000 | ORAL_TABLET | Freq: Once | ORAL | Status: AC
  Administered 2022-04-14: 1 via ORAL
  Filled 2022-04-14: qty 1

## 2022-04-14 MED ORDER — ONDANSETRON 4 MG PO TBDP
4.0000 mg | ORAL_TABLET | Freq: Once | ORAL | Status: AC
  Administered 2022-04-14: 4 mg via ORAL
  Filled 2022-04-14: qty 1

## 2022-04-14 NOTE — ED Provider Notes (Signed)
[facility] Provider Note    Event Date/Time   First MD Initiated Contact with Patient 04/14/22 0030     (approximate)   History   Abdominal Pain and Vaginal Bleeding   HPI  Sarah Eaton is a 36 y.o. female who presents to the ED from home with a chief complaint of fall resulting in left lower quadrant abdominal pain and vaginal bleeding.  Patient reports she had a hysterectomy on 04/01/2022.  Experience light spotting last week.  States she and her husband were horsing around in the kitchen, she moved some stuff when she fell over the back of a chair and struck her left lower abdomen.  Felt immediate sharp pain associated with nausea.  Experiencing vaginal bleeding last night before going to bed and had an increase in bleeding today after the fall.  Denies bleeding now.  Denies tracking head or LOC.  Denies headache, vision changes, neck pain, chest pain, shortness of breath, vomiting or dizziness.  Denies anticoagulant use.     Past Medical History   Past Medical History:  Diagnosis Date   Anemia    Hypertension    Obesity    Ovarian cyst    Pyelonephritis 05/04/2021   Sepsis due to Escherichia coli (E. coli) (HCC) 05/04/2021     Active Problem List   Patient Active Problem List   Diagnosis Date Noted   Left lower quadrant abdominal pain 01/02/2022   Chronic idiopathic constipation 01/02/2022   History of pyelonephritis 07/13/2021   GERD (gastroesophageal reflux disease) 05/04/2021   Migraine 05/04/2021   Obesity (BMI 30-39.9) 10/20/2019     Past Surgical History   Past Surgical History:  Procedure Laterality Date   CESAREAN SECTION     CESAREAN SECTION WITH BILATERAL TUBAL LIGATION Bilateral 08/05/2013   Procedure: REPEAT CESAREAN SECTION WITH BILATERAL TUBAL LIGATION;  Surgeon: Allie Bossier, MD;  Location: [facility];  Service: Obstetrics;  Laterality: Bilateral;   ENDOMETRIAL ABLATION W/ NOVASURE  09/2014   LAPAROSCOPIC VAGINAL  HYSTERECTOMY WITH SALPINGECTOMY N/A 04/01/2022   Procedure: LAPAROSCOPIC ASSISTED VAGINAL HYSTERECTOMY WITH SALPINGECTOMY;  Surgeon: Hildred Laser, MD;  Location: [facility];  Service: Gynecology;  Laterality: N/A;   TUBAL LIGATION       Home Medications   Prior to Admission medications   Medication Sig Start Date End Date Taking? Authorizing Provider  diphenhydrAMINE (BENADRYL) 25 MG tablet Take 25 mg by mouth every 6 (six) hours as needed.    [provider]  HYDROmorphone (DILAUDID) 2 MG tablet Take 1 tablet (2 mg total) by mouth every 4 (four) hours as needed for severe pain. 04/04/22   Hildred Laser, MD  ibuprofen (ADVIL) 600 MG tablet Take 1 tablet (600 mg total) by mouth every 6 (six) hours as needed. 04/01/22   Hildred Laser, MD  ondansetron (ZOFRAN-ODT) 4 MG disintegrating tablet Take 1 tablet (4 mg total) by mouth every 6 (six) hours as needed for nausea or vomiting. 04/02/22   Hildred Laser, MD  phentermine 37.5 MG capsule Take 1 capsule (37.5 mg total) by mouth every morning. 02/13/22   Anabel Halon, MD  rizatriptan (MAXALT) 10 MG tablet TAKE 1 TABLET BY MOUTH AS NEEDED FOR MIGRAINE. MAY REPEAT IN 2 HOURS IF NEEDED 01/07/22   Anabel Halon, MD  Semaglutide-Weight Management 0.5 MG/0.5ML SOAJ Inject 0.5 mg into the skin once a week for 28 days. 03/21/22 04/18/22  Anabel Halon, MD  Semaglutide-Weight Management 1 MG/0.5ML SOAJ Inject 1 mg  into the skin once a week for 28 days. 04/19/22 05/17/22  Anabel Halon, MD  Semaglutide-Weight Management 1.7 MG/0.75ML SOAJ Inject 1.7 mg into the skin once a week for 28 days. 05/18/22 06/15/22  Anabel Halon, MD  Semaglutide-Weight Management 2.4 MG/0.75ML SOAJ Inject 2.4 mg into the skin once a week for 28 days. 06/16/22 07/14/22  Anabel Halon, MD  topiramate (TOPAMAX) 25 MG tablet TAKE 1 TABLET BY MOUTH TWICE A DAY 01/15/22   Anabel Halon, MD     Allergies  Banana and Goehner Bing allergy]   Family History   Family History  Problem  Relation Age of Onset   Cancer Other        breast   Hypertension Mother    Cancer Mother        skin   Thyroid disease Mother    Cancer Maternal Grandmother    Hypertension Maternal Grandmother    Thyroid disease Father    Thyroid disease Paternal Grandmother      Physical Exam  Triage Vital Signs: ED Triage Vitals [04/13/22 2119]  Enc Vitals Group     BP (!) 146/111     Pulse Rate 95     Resp 18     Temp 98.6 F (37 C)     Temp Source Oral     SpO2 100 %     Weight 150 lb (68 kg)     Height  (1.575 m)     Head Circumference      Peak Flow      Pain Score 8     Pain Loc      Pain Edu?      Excl. in GC?     Updated Vital Signs: BP (!) 146/111 (BP Location: Left Arm)   Pulse 95   Temp 98.6 F (37 C) (Oral)   Resp 18   Ht  (1.575 m)   Wt 68 kg   SpO2 100%   BMI 27.44 kg/m    General: Awake, no distress.  CV:  RRR.  Good peripheral perfusion.  Resp:  Normal effort.  CTAB. Abd:  Mildly tender to left lower quadrant without rebound or guarding.  No external evidence of injury; no ecchymosis.  No distention.  Other:  No truncal vesicles   ED Results / Procedures / Treatments  Labs (all labs ordered are listed, but only abnormal results are displayed) Labs Reviewed  CBC - Abnormal; Notable for the following components:      Result Value   Platelets 498 (*)    All other components within normal limits  COMPREHENSIVE METABOLIC PANEL  LIPASE, BLOOD  TYPE AND SCREEN     EKG  None   RADIOLOGY I have independently visualized and interpreted patient's CTA as well as noted the radiology interpretation:  CTA abdomen/pelvis: No acute intra-abdominal or intrapelvic process; no postoperative complication  Official radiology report(s): CT Angio Abd/Pel W and/or Wo Contrast  Result Date: 04/13/2022 CLINICAL DATA:  Left lower abdominal pain, vaginal bleeding since yesterday, recent hysterectomy 04/01/2022 EXAM: CTA ABDOMEN AND PELVIS WITHOUT AND  WITH CONTRAST TECHNIQUE: Multidetector CT imaging of the abdomen and pelvis was performed using the standard protocol during bolus administration of intravenous contrast. Multiplanar reconstructed images and MIPs were obtained and reviewed to evaluate the vascular anatomy. RADIATION DOSE REDUCTION: This exam was performed according to the departmental dose-optimization program which includes automated exposure control, adjustment of the mA and/or kV according to patient size  and/or use of iterative reconstruction technique. CONTRAST:  80mL OMNIPAQUE IOHEXOL 350 MG/ML SOLN COMPARISON:  01/04/2022 FINDINGS: VASCULAR Aorta: Normal caliber aorta without aneurysm, dissection, vasculitis or significant stenosis. Celiac: Patent without evidence of aneurysm, dissection, vasculitis or significant stenosis. SMA: Patent without evidence of aneurysm, dissection, vasculitis or significant stenosis. Renals: There are 2 right renal arteries and a single left renal artery. The bilateral renal arteries are patent without evidence of aneurysm, dissection, vasculitis, fibromuscular dysplasia or significant stenosis. IMA: Patent without evidence of aneurysm, dissection, vasculitis or significant stenosis. Inflow: Patent without evidence of aneurysm, dissection, vasculitis or significant stenosis. Proximal Outflow: Bilateral common femoral and visualized portions of the superficial and profunda femoral arteries are patent without evidence of aneurysm, dissection, vasculitis or significant stenosis. Veins: No obvious venous abnormality within the limitations of this arterial phase study. Review of the MIP images confirms the above findings. NON-VASCULAR Lower chest: No acute pleural or parenchymal lung disease. Hepatobiliary: No focal liver abnormality is seen. No gallstones, gallbladder wall thickening, or biliary dilatation. Pancreas: Unremarkable. No pancreatic ductal dilatation or surrounding inflammatory changes. Spleen: Normal in  size without focal abnormality. Adrenals/Urinary Tract: Adrenal glands are unremarkable. Kidneys are normal, without renal calculi, focal lesion, or hydronephrosis. Bladder is minimally distended which limits its evaluation. Stomach/Bowel: No bowel obstruction or ileus. The appendix is not well visualized. No bowel wall thickening or inflammatory change. Lymphatic: No pathologic adenopathy within the abdomen or pelvis. Reproductive: Postsurgical changes from hysterectomy. No adnexal masses. No postsurgical complications in the surgical bed. Other: No free fluid or free intraperitoneal gas. No abdominal wall hernia. Musculoskeletal: No acute or destructive bony lesions. Reconstructed images demonstrate no additional findings. IMPRESSION: VASCULAR 1. No significant vascular finding. NON-VASCULAR 1. No acute intra-abdominal or intrapelvic process. 2. No complication after recent hysterectomy. No abnormalities within the surgical bed. Electronically Signed   By: Sharlet SalinaMichael  Brown M.D.   On: 04/13/2022 23:00     PROCEDURES:  Critical Care performed: No  Procedures  Pelvic exam: External exam unremarkable without rashes, lesions or vesicles.  Speculum exam demonstrates no bleeding, sutures intact at vaginal cuff.  Bimanual exam unremarkable.   MEDICATIONS ORDERED IN ED: Medications  oxyCODONE-acetaminophen (PERCOCET/ROXICET) 5-325 MG per tablet 1 tablet (has no administration in time range)  ondansetron (ZOFRAN-ODT) disintegrating tablet 4 mg (has no administration in time range)  iohexol (OMNIPAQUE) 350 MG/ML injection 80 mL (80 mLs Intravenous Contrast Given 04/13/22 2218)     IMPRESSION / MDM / ASSESSMENT AND PLAN / ED COURSE  I reviewed the triage vital signs and the nursing notes.                             36 year old female presenting with left lower quadrant abdominal/pelvic pain and vaginal spotting in the setting of blunt trauma.  I have identified this patient to have a potentially  life-threatening condition. Differential diagnosis includes, but is not limited to, ovarian cyst, ovarian torsion, acute appendicitis, diverticulitis, urinary tract infection/pyelonephritis, endometriosis, bowel obstruction, colitis, renal colic, gastroenteritis, hernia, etc.   I have personally reviewed patient's medical records and note her operative note from 04/01/2022 as well as patient message from 04/03/2022 of vaginal spotting.  Laboratory results demonstrate normal H/H13/42, normal electrolytes.  CTA negative for acute intra-abdominal/pelvic injury.  No vaginal bleeding noted on pelvic exam; vaginal cuff intact.  Will administer Percocet for pain, Zofran to prevent nausea.  Patient will follow-up with her GYN next week.  Strict  return precautions given.  Patient and spouse verbalized understanding and agree with plan of care.      FINAL CLINICAL IMPRESSION(S) / ED DIAGNOSES   Final diagnoses:  Left lower quadrant abdominal pain  Contusion of abdominal wall, initial encounter     Rx / DC Orders   ED Discharge Orders     None        Note:  This document was prepared using Dragon voice recognition software and may include unintentional dictation errors.   Irean Hong, MD 04/14/22 [phone]

## 2022-04-14 NOTE — Discharge Instructions (Signed)
You may take pain and nausea medicine as needed (Percocet/Zofran #20).  Return to the ER for worsening symptoms, persistent vomiting, difficulty breathing or other concerns.

## 2022-04-16 ENCOUNTER — Encounter: Payer: Self-pay | Admitting: Obstetrics and Gynecology

## 2022-04-16 NOTE — Progress Notes (Deleted)
    OBSTETRICS/GYNECOLOGY POST-OPERATIVE CLINIC VISIT  Subjective:     Sarah Eaton is a 36 y.o. female who presents to the clinic  2.2  weeks status post  for LAPAROSCOPIC ASSISTED VAGINAL HYSTERECTOMY WITH SALPINGECTOMY. Eating a regular diet {with-without:5700} difficulty. Bowel movements are {normal/abnormal***:19619}. {pain control:13522::"The patient is not having any pain."}She was evaluated in ED on 04/14/2022 for a fall resulting in left lower quadrant abdominal pain and vaginal bleeding.  {Common ambulatory SmartLinks:19316}  Review of Systems Pertinent items are noted in HPI.   Objective:   There were no vitals taken for this visit. There is no height or weight on file to calculate BMI.  General:  alert and no distress  Abdomen: soft, bowel sounds active, non-tender  Incision:   healing well, no drainage, no erythema, no hernia, no seroma, no swelling, no dehiscence, incision well approximated    Pathology:    Assessment:   Patient s/p LAPAROSCOPIC ASSISTED VAGINAL HYSTERECTOMY WITH SALPINGECTOMY (surgery)  {doing well:13525::"Doing well postoperatively."}   Plan:   1. Continue any current medications as instructed by provider. 2. Wound care discussed. 3. Operative findings again reviewed. Pathology report discussed. 4. Activity restrictions: {restrictions:13723} 5. Anticipated return to work: not applicable. 6. Follow up: {3-00:76226} {time; units:18646} for ***  Hildred Laser, MD [facility]

## 2022-04-17 ENCOUNTER — Encounter (HOSPITAL_COMMUNITY): Payer: Self-pay

## 2022-04-17 ENCOUNTER — Emergency Department (HOSPITAL_COMMUNITY)
Admission: EM | Admit: 2022-04-17 | Discharge: 2022-04-17 | Disposition: A | Discharge status: Home / Self Care | Payer: 59 | Attending: Emergency Medicine | Admitting: Emergency Medicine

## 2022-04-17 ENCOUNTER — Encounter: Payer: 59 | Admitting: Obstetrics and Gynecology

## 2022-04-17 ENCOUNTER — Other Ambulatory Visit: Payer: Self-pay

## 2022-04-17 DIAGNOSIS — R110 Nausea: Secondary | ICD-10-CM | POA: Insufficient documentation

## 2022-04-17 DIAGNOSIS — D72829 Elevated white blood cell count, unspecified: Secondary | ICD-10-CM | POA: Insufficient documentation

## 2022-04-17 DIAGNOSIS — R109 Unspecified abdominal pain: Secondary | ICD-10-CM | POA: Insufficient documentation

## 2022-04-17 DIAGNOSIS — R197 Diarrhea, unspecified: Secondary | ICD-10-CM | POA: Insufficient documentation

## 2022-04-17 DIAGNOSIS — R509 Fever, unspecified: Secondary | ICD-10-CM | POA: Insufficient documentation

## 2022-04-17 LAB — CBC WITH DIFFERENTIAL/PLATELET
Abs Immature Granulocytes: 0.01 10*3/uL (ref 0.00–0.07)
Basophils Absolute: 0.1 10*3/uL (ref 0.0–0.1)
Basophils Relative: 1 %
Eosinophils Absolute: 0.2 10*3/uL (ref 0.0–0.5)
Eosinophils Relative: 2 %
HCT: 38.2 % (ref 36.0–46.0)
Hemoglobin: 12.6 g/dL (ref 12.0–15.0)
Immature Granulocytes: 0 %
Lymphocytes Relative: 40 %
Lymphs Abs: 3.2 10*3/uL (ref 0.7–4.0)
MCH: 30.1 pg (ref 26.0–34.0)
MCHC: 33 g/dL (ref 30.0–36.0)
MCV: 91.4 fL (ref 80.0–100.0)
Monocytes Absolute: 0.4 10*3/uL (ref 0.1–1.0)
Monocytes Relative: 5 %
Neutro Abs: 4.3 10*3/uL (ref 1.7–7.7)
Neutrophils Relative %: 52 %
Platelets: 402 10*3/uL — ABNORMAL HIGH (ref 150–400)
RBC: 4.18 MIL/uL (ref 3.87–5.11)
RDW: 12.4 % (ref 11.5–15.5)
WBC: 8.1 10*3/uL (ref 4.0–10.5)
nRBC: 0 % (ref 0.0–0.2)

## 2022-04-17 LAB — BASIC METABOLIC PANEL
Anion gap: 6 (ref 5–15)
BUN: 11 mg/dL (ref 6–20)
CO2: 24 mmol/L (ref 22–32)
Calcium: 9.1 mg/dL (ref 8.9–10.3)
Chloride: 108 mmol/L (ref 98–111)
Creatinine, Ser: 0.93 mg/dL (ref 0.44–1.00)
GFR, Estimated: >60 mL/min (ref >60)
Glucose, Bld: 83 mg/dL (ref 70–99)
Potassium: 3.9 mmol/L (ref 3.5–5.1)
Sodium: 138 mmol/L (ref 135–145)

## 2022-04-17 LAB — URINALYSIS, ROUTINE W REFLEX MICROSCOPIC
Bilirubin Urine: NEGATIVE
Glucose, UA: NEGATIVE mg/dL
Hgb urine dipstick: NEGATIVE
Ketones, ur: NEGATIVE mg/dL
Leukocytes,Ua: NEGATIVE
Nitrite: NEGATIVE
Protein, ur: NEGATIVE mg/dL
Specific Gravity, Urine: 1.018 (ref 1.005–1.030)
pH: 6 (ref 5.0–8.0)

## 2022-04-17 LAB — TYPE AND SCREEN
ABO/RH(D): A POS
Antibody Screen: NEGATIVE

## 2022-04-17 NOTE — ED Triage Notes (Signed)
Pt came in POV d/t complications post hysterectomy that she had on the 15th this month. Pt states she hit her stomach on the back of a chair & had sharp cramps & a small amt of bleeding afterwards on the 26th, since then she has had constant exhaustion, & last night endorses fever & diarrhea. She also reports that when she went to get checked out her labs were not normal for her (per pt).

## 2022-04-17 NOTE — Discharge Instructions (Signed)
Your testing has been totally normal, there is no signs of infection, your blood counts are totally normal, your urine sample is clean without any signs of infection.  If you are still taking opiate pain medication such as hydromorphone I would recommend that you stop this immediately, same with Percocet, both of these medications will cause you to be nauseated.  Tylenol or ibuprofen should be fine and though you may have pain this will be safer and prevent some of the nausea that you are having  Thank you for allowing [place] to treat you in the emergency department today.  After reviewing your examination and potential testing that was done it appears that you are safe to go home.  I would like for you to follow-up with your doctor within the next several days, have them obtain your results and follow-up with them to review all of these tests.  If you should develop severe or worsening symptoms return to the emergency department immediately

## 2022-04-17 NOTE — ED Provider Notes (Signed)
[facility] EMERGENCY DEPARTMENT Provider Note   CSN: [id number] Arrival date & time: 04/17/22  [id number]     History  No chief complaint on file.   Sarah Eaton is a 36 y.o. female.  HPI  This patient is a 36 year old female, she reports that she recently had a laparoscopic hysterectomy, this was done on 15 May approximately 2 weeks ago.  Unfortunately she had a fall a couple of days ago and landed on her abdomen, she had presented to the emergency department because of abdominal pain after the fall, she had lab work done which was grossly unremarkable and was discharged home in stable condition.  She reports that yesterday she felt like she might of had a fever, measured at 99-1/2, she had ongoing nausea which to be honest has been going on ever since the surgery.  This is not worsened at all.  She is still using intermittent pain medications.  She has had some diarrhea this morning.  She denies any blood in the stools.  The patient is concerned because she felt she had a fever and had abnormal lab test.  She is concerned about her elevated platelet count which was close to 500,000.  She did not have a leukocytosis or any significant anemia, her hemoglobin was 13.9, white blood cell count was 10,200.  The patient does recall a history of pyelonephritis which left her hospitalized and very sick about a year ago, she has had some pressure with urination recently.  Home Medications Prior to Admission medications   Medication Sig Start Date End Date Taking? Authorizing Provider  diphenhydrAMINE (BENADRYL) 25 MG tablet Take 25 mg by mouth every 6 (six) hours as needed.    [provider]  HYDROmorphone (DILAUDID) 2 MG tablet Take 1 tablet (2 mg total) by mouth every 4 (four) hours as needed for severe pain. 04/04/22   Rubie Maid, MD  ibuprofen (ADVIL) 600 MG tablet Take 1 tablet (600 mg total) by mouth every 6 (six) hours as needed. 04/01/22   Rubie Maid, MD   ondansetron (ZOFRAN-ODT) 4 MG disintegrating tablet Take 1 tablet (4 mg total) by mouth every 8 (eight) hours as needed for nausea or vomiting. 04/14/22   Paulette Blanch, MD  oxyCODONE-acetaminophen (PERCOCET/ROXICET) 5-325 MG tablet Take 1 tablet by mouth every 4 (four) hours as needed for severe pain. 04/14/22   Paulette Blanch, MD  phentermine 37.5 MG capsule Take 1 capsule (37.5 mg total) by mouth every morning. 02/13/22   Lindell Spar, MD  rizatriptan (MAXALT) 10 MG tablet TAKE 1 TABLET BY MOUTH AS NEEDED FOR MIGRAINE. MAY REPEAT IN 2 HOURS IF NEEDED 01/07/22   Lindell Spar, MD  Semaglutide-Weight Management 0.5 MG/0.5ML SOAJ Inject 0.5 mg into the skin once a week for 28 days. 03/21/22 04/18/22  Lindell Spar, MD  Semaglutide-Weight Management 1 MG/0.5ML SOAJ Inject 1 mg into the skin once a week for 28 days. 04/19/22 05/17/22  Lindell Spar, MD  Semaglutide-Weight Management 1.7 MG/0.75ML SOAJ Inject 1.7 mg into the skin once a week for 28 days. 05/18/22 06/15/22  Lindell Spar, MD  Semaglutide-Weight Management 2.4 MG/0.75ML SOAJ Inject 2.4 mg into the skin once a week for 28 days. 06/16/22 07/14/22  Lindell Spar, MD  topiramate (TOPAMAX) 25 MG tablet TAKE 1 TABLET BY MOUTH TWICE A DAY 01/15/22   Lindell Spar, MD      Allergies    Banana and Blain Pais allergy]  Review of Systems   Review of Systems  All other systems reviewed and are negative.  Physical Exam Updated Vital Signs BP (!) 131/93 (BP Location: Right Arm)   Pulse 82   Temp 98.1 F (36.7 C) (Oral)   Resp 18   SpO2 100%  Physical Exam Vitals and nursing note reviewed.  Constitutional:      General: She is not in acute distress.    Appearance: She is well-developed.  HENT:     Head: Normocephalic and atraumatic.     Mouth/Throat:     Pharynx: No oropharyngeal exudate.  Eyes:     General: No scleral icterus.       Right eye: No discharge.        Left eye: No discharge.     Conjunctiva/sclera: Conjunctivae  normal.     Pupils: Pupils are equal, round, and reactive to light.  Neck:     Thyroid: No thyromegaly.     Vascular: No JVD.  Cardiovascular:     Rate and Rhythm: Normal rate and regular rhythm.     Heart sounds: Normal heart sounds. No murmur heard.   No friction rub. No gallop.  Pulmonary:     Effort: Pulmonary effort is normal. No respiratory distress.     Breath sounds: Normal breath sounds. No wheezing or rales.  Abdominal:     General: Bowel sounds are normal. There is no distension.     Palpations: Abdomen is soft. There is no mass.     Tenderness: There is abdominal tenderness.     Comments: Very soft abdomen, good healing laparoscopic wounds, minimal tenderness suprapubic only, no other tenderness  Musculoskeletal:        General: No tenderness. Normal range of motion.     Cervical back: Normal range of motion and neck supple.  Lymphadenopathy:     Cervical: No cervical adenopathy.  Skin:    General: Skin is warm and dry.     Findings: No erythema or rash.  Neurological:     Mental Status: She is alert.     Coordination: Coordination normal.  Psychiatric:        Behavior: Behavior normal.    ED Results / Procedures / Treatments   Labs (all labs ordered are listed, but only abnormal results are displayed) Labs Reviewed  CBC WITH DIFFERENTIAL/PLATELET - Abnormal; Notable for the following components:      Result Value   Platelets 402 (*)    All other components within normal limits  URINE CULTURE  BASIC METABOLIC PANEL  URINALYSIS, ROUTINE W REFLEX MICROSCOPIC    EKG None  Radiology No results found.  Procedures Procedures    Medications Ordered in ED Medications - No data to display  ED Course/ Medical Decision Making/ A&P                           Medical Decision Making  This patient is actually very well-appearing, she brought a bag of fast food into the room with her, she is eating and drinking without difficulty.  This patient presents to  the ED for concern of fever nausea abdominal pain, fevers subjective, this involves an extensive number of treatment options, and is a complaint that carries with it a high risk of complications and morbidity.  The differential diagnosis includes urinary tract infection, postoperative nausea, nausea to medications   Co morbidities that complicate the patient evaluation  Recent surgery Previous urinary tract infection  Additional history obtained:  Additional history obtained from medical record External records from outside source obtained and reviewed including previous labs which were reviewed from several days ago   Lab Tests:  I Ordered, and personally interpreted labs.  The pertinent results include:  normal CBC, Metabolic panel and clean urine.  No imaging indicated at this time.  Cardiac Monitoring: / EKG:  The patient was maintained on a cardiac monitor.  I personally viewed and interpreted the cardiac monitored which showed an underlying rhythm of: normal sinus rhythm, rate of 80   Consultations Obtained:  Not needed, pt is low risk and not needing admission.   Problem List / ED Course / Critical interventions / Medication management  Patient upset that she could not follow-up with her doctor today because she was a few minutes late to the appointment.  Thankfully labs have been reassuring and show that there is no need for aggressive interventions, she can be discharged home safely I asked the patient to stop taking opiate pain medications as this may be a big part of her symptoms I have reviewed the patients home medicines and have made adjustments as needed   Social Determinants of Health:  None   Test / Admission - Considered:  Considered CT scan but with minimal discomfort in the patient's abdomen and pelvis exactly where her surgical site was with no fever no tachycardia no leukocytosis this is not indicated   I have discussed with the patient at the  bedside the results, and the meaning of these results.  They have expressed her understanding to the need for follow-up with primary care physician        Final Clinical Impression(s) / ED Diagnoses Final diagnoses:  Nauseated  Subjective fever    Rx / DC Orders ED Discharge Orders     None         Noemi Chapel, MD 04/17/22 1103

## 2022-04-18 ENCOUNTER — Encounter: Payer: 59 | Admitting: Obstetrics and Gynecology

## 2022-04-18 LAB — URINE CULTURE: Culture: NO GROWTH

## 2022-04-29 ENCOUNTER — Encounter: Payer: Self-pay | Admitting: Obstetrics and Gynecology

## 2022-04-29 ENCOUNTER — Telehealth: Payer: Self-pay | Admitting: Obstetrics and Gynecology

## 2022-04-29 NOTE — Telephone Encounter (Signed)
Patient was originally scheduled for a post op appointment on may 31st in which she was late and had to be rescheduled. Patient has been rescheduled for June 29th the next earliest available. Patient calls today with complaints of bloody discharge with odor post hysterectomy. Please advise.

## 2022-04-30 NOTE — Telephone Encounter (Signed)
Called patient and relayed information mentioned in telephone message as provided by the provider. Pt verbalized understanding and states that she has been doing more physical activity and states that she did not want the appt for tomorrow on 6/14 at 8:45am and that she will keep the one for June 29th. Informed patient if any changes please contact the office.

## 2022-05-03 ENCOUNTER — Ambulatory Visit
Admission: RE | Admit: 2022-05-03 | Discharge: 2022-05-03 | Payer: 59 | Source: Ambulatory Visit | Attending: Emergency Medicine | Admitting: Emergency Medicine

## 2022-05-03 ENCOUNTER — Emergency Department
Admission: EM | Admit: 2022-05-03 | Discharge: 2022-05-03 | Disposition: A | Discharge status: Home / Self Care | Payer: 59 | Attending: Emergency Medicine | Admitting: Emergency Medicine

## 2022-05-03 ENCOUNTER — Encounter: Payer: Self-pay | Admitting: Emergency Medicine

## 2022-05-03 VITALS — BP 118/87 | HR 106 | Temp 98.6°F | Resp 16

## 2022-05-03 DIAGNOSIS — R509 Fever, unspecified: Secondary | ICD-10-CM | POA: Diagnosis not present

## 2022-05-03 DIAGNOSIS — B9689 Other specified bacterial agents as the cause of diseases classified elsewhere: Secondary | ICD-10-CM

## 2022-05-03 DIAGNOSIS — R1030 Lower abdominal pain, unspecified: Secondary | ICD-10-CM | POA: Insufficient documentation

## 2022-05-03 DIAGNOSIS — N760 Acute vaginitis: Secondary | ICD-10-CM | POA: Insufficient documentation

## 2022-05-03 DIAGNOSIS — N898 Other specified noninflammatory disorders of vagina: Secondary | ICD-10-CM | POA: Insufficient documentation

## 2022-05-03 LAB — CBC
HCT: 39.8 % (ref 36.0–46.0)
Hemoglobin: 13.5 g/dL (ref 12.0–15.0)
MCH: 30.4 pg (ref 26.0–34.0)
MCHC: 33.9 g/dL (ref 30.0–36.0)
MCV: 89.6 fL (ref 80.0–100.0)
Platelets: 319 10*3/uL (ref 150–400)
RBC: 4.44 MIL/uL (ref 3.87–5.11)
RDW: 12.7 % (ref 11.5–15.5)
WBC: 11 10*3/uL — ABNORMAL HIGH (ref 4.0–10.5)
nRBC: 0 % (ref 0.0–0.2)

## 2022-05-03 LAB — COMPREHENSIVE METABOLIC PANEL
ALT: 15 U/L (ref 0–44)
AST: 11 U/L — ABNORMAL LOW (ref 15–41)
Albumin: 4 g/dL (ref 3.5–5.0)
Alkaline Phosphatase: 54 U/L (ref 38–126)
Anion gap: 5 (ref 5–15)
BUN: 19 mg/dL (ref 6–20)
CO2: 26 mmol/L (ref 22–32)
Calcium: 9.2 mg/dL (ref 8.9–10.3)
Chloride: 108 mmol/L (ref 98–111)
Creatinine, Ser: 1.07 mg/dL — ABNORMAL HIGH (ref 0.44–1.00)
GFR, Estimated: >60 mL/min (ref >60)
Glucose, Bld: 89 mg/dL (ref 70–99)
Potassium: 4 mmol/L (ref 3.5–5.1)
Sodium: 139 mmol/L (ref 135–145)
Total Bilirubin: 0.5 mg/dL (ref 0.3–1.2)
Total Protein: 7.2 g/dL (ref 6.5–8.1)

## 2022-05-03 LAB — POCT URINALYSIS DIP (MANUAL ENTRY)
Bilirubin, UA: NEGATIVE
Glucose, UA: NEGATIVE mg/dL
Ketones, POC UA: NEGATIVE mg/dL
Nitrite, UA: NEGATIVE
Protein Ur, POC: NEGATIVE mg/dL
Spec Grav, UA: 1.02 (ref 1.010–1.025)
Urobilinogen, UA: 0.2 E.U./dL
pH, UA: 6.5 (ref 5.0–8.0)

## 2022-05-03 LAB — URINALYSIS, ROUTINE W REFLEX MICROSCOPIC
Bacteria, UA: NONE SEEN
Bilirubin Urine: NEGATIVE
Glucose, UA: NEGATIVE mg/dL
Hgb urine dipstick: NEGATIVE
Ketones, ur: 5 mg/dL — AB
Nitrite: NEGATIVE
Protein, ur: NEGATIVE mg/dL
Specific Gravity, Urine: 1.021 (ref 1.005–1.030)
pH: 6 (ref 5.0–8.0)

## 2022-05-03 LAB — CHLAMYDIA/NGC RT PCR (ARMC ONLY)
Chlamydia Tr: NOT DETECTED
N gonorrhoeae: NOT DETECTED

## 2022-05-03 LAB — WET PREP, GENITAL
Sperm: NONE SEEN
Trich, Wet Prep: NONE SEEN
WBC, Wet Prep HPF POC: >=10 — AB (ref <10)
Yeast Wet Prep HPF POC: NONE SEEN

## 2022-05-03 LAB — LIPASE, BLOOD: Lipase: 31 U/L (ref 11–51)

## 2022-05-03 MED ORDER — HYDROCODONE-ACETAMINOPHEN 5-325 MG PO TABS
1.0000 | ORAL_TABLET | Freq: Once | ORAL | Status: AC
  Administered 2022-05-03: 1 via ORAL
  Filled 2022-05-03: qty 1

## 2022-05-03 MED ORDER — METRONIDAZOLE 500 MG PO TABS
500.0000 mg | ORAL_TABLET | Freq: Once | ORAL | Status: AC
  Administered 2022-05-03: 500 mg via ORAL
  Filled 2022-05-03: qty 1

## 2022-05-03 MED ORDER — TRAMADOL HCL 50 MG PO TABS: 50.0000 mg | ORAL_TABLET | Freq: Four times a day (QID) | ORAL | 0 refills | Status: DC | PRN

## 2022-05-03 MED ORDER — METRONIDAZOLE 500 MG PO TABS: 500.0000 mg | ORAL_TABLET | Freq: Two times a day (BID) | ORAL | 0 refills | Status: DC

## 2022-05-03 NOTE — ED Notes (Signed)
POC urine pregnancy test NEGATIVE

## 2022-05-03 NOTE — ED Triage Notes (Signed)
Pt presents via POV with complaints of lower abdominal pain for the last 2 days. Hx of Hysterectomy 4 weeks ago - no complications with the procedure. Sent by UC for "extensive testing". Denies N/V.

## 2022-05-03 NOTE — ED Provider Notes (Signed)
[name]    CSN: [id number] Arrival date & time: 05/03/22  1813      History   Chief Complaint Chief Complaint  Patient presents with   Vaginal Discharge    Just had hysterectomy 4 weeks ago I have discharge yellow/green smell, spotting and pressure. Left pain in side lower back pain - Entered by patient    HPI Sarah Eaton is a 36 y.o. female.  Patient had laparoscopic assisted vaginal hysterectomy with salpingectomy on 04/01/2022.  She presents with 2 day history of lower abdominal pain, low grade fever of 99.9, chills, low back pain.  She also reports malodorous bloody vaginal discharge x 5-6 days.  She denies dysuria, hematuria, vomiting, diarrhea, constipation, or other symptoms.  Last bowel movement today.  No treatment at home.   The history is provided by the patient and medical records.    Past Medical History:  Diagnosis Date   Anemia    Hypertension    Obesity    Ovarian cyst    Pyelonephritis 05/04/2021   Sepsis due to Escherichia coli (E. coli) (HCC) 05/04/2021    Patient Active Problem List   Diagnosis Date Noted   Left lower quadrant abdominal pain 01/02/2022   Chronic idiopathic constipation 01/02/2022   History of pyelonephritis 07/13/2021   GERD (gastroesophageal reflux disease) 05/04/2021   Migraine 05/04/2021   Obesity (BMI 30-39.9) 10/20/2019    Past Surgical History:  Procedure Laterality Date   CESAREAN SECTION     CESAREAN SECTION WITH BILATERAL TUBAL LIGATION Bilateral 08/05/2013   Procedure: REPEAT CESAREAN SECTION WITH BILATERAL TUBAL LIGATION;  Surgeon: Allie Bossier, MD;  Location: [facility];  Service: Obstetrics;  Laterality: Bilateral;   ENDOMETRIAL ABLATION W/ NOVASURE  09/2014   LAPAROSCOPIC VAGINAL HYSTERECTOMY WITH SALPINGECTOMY N/A 04/01/2022   Procedure: LAPAROSCOPIC ASSISTED VAGINAL HYSTERECTOMY WITH SALPINGECTOMY;  Surgeon: Hildred Laser, MD;  Location: [facility] OR[facility];  Service: Gynecology;  Laterality: N/A;   TUBAL LIGATION       OB History     Gravida  2   Para  2   Term  2   Preterm      AB      Living  2      SAB      IAB      Ectopic      Multiple      Live Births  2            Home Medications    Prior to Admission medications   Medication Sig Start Date End Date Taking? Authorizing Provider  diphenhydrAMINE (BENADRYL) 25 MG tablet Take 25 mg by mouth every 6 (six) hours as needed.   Yes [provider]  ibuprofen (ADVIL) 600 MG tablet Take 1 tablet (600 mg total) by mouth every 6 (six) hours as needed. 04/01/22  Yes Hildred Laser, MD  ondansetron (ZOFRAN-ODT) 4 MG disintegrating tablet Take 1 tablet (4 mg total) by mouth every 8 (eight) hours as needed for nausea or vomiting. 04/14/22  Yes Irean Hong, MD  phentermine 37.5 MG capsule Take 1 capsule (37.5 mg total) by mouth every morning. 02/13/22  Yes Patel, Earlie Lou, MD  rizatriptan (MAXALT) 10 MG tablet TAKE 1 TABLET BY MOUTH AS NEEDED FOR MIGRAINE. MAY REPEAT IN 2 HOURS IF NEEDED 01/07/22  Yes Anabel Halon, MD  Semaglutide-Weight Management 1 MG/0.5ML SOAJ Inject 1 mg into the skin once a week for 28 days. 04/19/22 05/17/22 Yes Anabel Halon, MD  topiramate (TOPAMAX) 25 MG tablet TAKE 1 TABLET BY MOUTH TWICE A DAY 01/15/22  Yes Lindell Spar, MD  HYDROmorphone (DILAUDID) 2 MG tablet Take 1 tablet (2 mg total) by mouth every 4 (four) hours as needed for severe pain. 04/04/22   Rubie Maid, MD  oxyCODONE-acetaminophen (PERCOCET/ROXICET) 5-325 MG tablet Take 1 tablet by mouth every 4 (four) hours as needed for severe pain. 04/14/22   Paulette Blanch, MD  Semaglutide-Weight Management 1.7 MG/0.75ML SOAJ Inject 1.7 mg into the skin once a week for 28 days. 05/18/22 06/15/22  Lindell Spar, MD  Semaglutide-Weight Management 2.4 MG/0.75ML SOAJ Inject 2.4 mg into the skin once a week for 28 days. 06/16/22 07/14/22  Lindell Spar, MD    Family History Family History  Problem Relation Age of Onset   Cancer Other        breast    Hypertension Mother    Cancer Mother        skin   Thyroid disease Mother    Cancer Maternal Grandmother    Hypertension Maternal Grandmother    Thyroid disease Father    Thyroid disease Paternal Grandmother     Social History Social History   Tobacco Use   Smoking status: Never   Smokeless tobacco: Never  Vaping Use   Vaping Use: Never used  Substance Use Topics   Alcohol use: Not Currently    Comment: occasionally   Drug use: Never     Allergies   Banana and [place] [fish allergy]   Review of Systems Review of Systems  Constitutional:  Positive for chills and fever.  Respiratory:  Negative for cough and shortness of breath.   Cardiovascular:  Negative for chest pain and palpitations.  Gastrointestinal:  Positive for abdominal pain. Negative for constipation, diarrhea, nausea and vomiting.  Genitourinary:  Positive for pelvic pain, vaginal bleeding and vaginal discharge. Negative for dysuria and hematuria.  All other systems reviewed and are negative.    Physical Exam Triage Vital Signs ED Triage Vitals  Enc Vitals Group     BP 05/03/22 1826 118/87     Pulse Rate 05/03/22 1826 (!) 106     Resp 05/03/22 1826 16     Temp 05/03/22 1826 98.6 F (37 C)     Temp Source 05/03/22 1826 Oral     SpO2 05/03/22 1826 98 %     Weight --      Height --      Head Circumference --      Peak Flow --      Pain Score 05/03/22 1825 7     Pain Loc --      Pain Edu? --      Excl. in [place]? --    No data found.  Updated Vital Signs BP 118/87 (BP Location: Left Arm)   Pulse (!) 106   Temp 98.6 F (37 C) (Oral)   Resp 16   LMP 02/16/2022   SpO2 98%   Visual Acuity Right Eye Distance:   Left Eye Distance:   Bilateral Distance:    Right Eye Near:   Left Eye Near:    Bilateral Near:     Physical Exam Vitals and nursing note reviewed.  Constitutional:      General: She is not in acute distress.    Appearance: Normal appearance. She is well-developed. She is not  ill-appearing.  HENT:     Mouth/Throat:     Mouth: Mucous membranes are moist.  Cardiovascular:  Rate and Rhythm: Normal rate and regular rhythm.     Heart sounds: Normal heart sounds.  Pulmonary:     Effort: Pulmonary effort is normal. No respiratory distress.     Breath sounds: Normal breath sounds.  Abdominal:     General: Bowel sounds are normal.     Palpations: Abdomen is soft.     Tenderness: There is abdominal tenderness in the right lower quadrant and left lower quadrant. There is no right CVA tenderness, left CVA tenderness, guarding or rebound.     Comments: Lower abdomen tender to palpation. No rebound or guarding.   Musculoskeletal:     Cervical back: Neck supple.  Skin:    General: Skin is warm and dry.  Neurological:     General: No focal deficit present.     Mental Status: She is alert and oriented to person, place, and time.     Gait: Gait normal.  Psychiatric:        Mood and Affect: Mood normal.        Behavior: Behavior normal.      UC Treatments / Results  Labs (all labs ordered are listed, but only abnormal results are displayed) Labs Reviewed  POCT URINALYSIS DIP (MANUAL ENTRY) - Abnormal; Notable for the following components:      Result Value   Blood, UA trace-intact (*)    Leukocytes, UA Small (1+) (*)    All other components within normal limits  CERVICOVAGINAL ANCILLARY ONLY    EKG   Radiology No results found.  Procedures Procedures (including critical care time)  Medications Ordered in UC Medications - No data to display  Initial Impression / Assessment and Plan / UC Course  I have reviewed the triage vital signs and the nursing notes.  Pertinent labs & imaging results that were available during my care of the patient were reviewed by me and considered in my medical decision making (see chart for details).    Lower abdominal pain, fever and chills, vaginal discharge.  Patient had hysterectomy on 04/01/2022.  She reports low  grade fever and chills yesterday.  Her lower abdomen is tender to palpation.  Discussed the limitations of evaluation of her symptoms in an urgent care setting.  Sending her to the ED.  She is agreeable to this.  She drove herself her and feels stable to drive herself to the ED.  She declines EMS.    Final Clinical Impressions(s) / UC Diagnoses   Final diagnoses:  Vaginal discharge  Lower abdominal pain  Fever and chills     Discharge Instructions      Go to the emergency department for evaluation of your lower abdominal pain, fever, chills, and vaginal discharge.     ED Prescriptions   None    PDMP not reviewed this encounter.   Mickie Bail, NP 05/03/22 1859

## 2022-05-03 NOTE — ED Notes (Signed)
Patient is being discharged from the Urgent Care and sent to the Emergency Department via personal vehicle . Per Wendee Beavers NP, patient is in need of higher level of care due to abdominal pain post hysterectomy. Patient is aware and verbalizes understanding of plan of care.  Vitals:   05/03/22 1826  BP: 118/87  Pulse: (!) 106  Resp: 16  Temp: 98.6 F (37 C)  SpO2: 98%

## 2022-05-03 NOTE — ED Triage Notes (Signed)
Pt presents with vaginal discharge and spotting. She has left side lower back pain and feels pressure in her lower abdomen x 2 days.

## 2022-05-03 NOTE — Discharge Instructions (Signed)
Go to the emergency department for evaluation of your lower abdominal pain, fever, chills, and vaginal discharge.

## 2022-05-03 NOTE — ED Provider Notes (Signed)
[facility] Provider Note    Event Date/Time   First MD Initiated Contact with Patient 05/03/22 2025     (approximate)   History   Abdominal Pain   HPI  Sarah Eaton is a 36 y.o. female with a history of laparoscopic vaginal hysterectomy on May 15 who presents with complaints of vaginal discharge and some abdominal discomfort.  Patient reports 2 to 3 days of discharge and discomfort.  Occasional spotting.  She does admit to having intercourse prior to 4 weeks.  She also states that she accidentally bumped her abdomen about a week or 2 after the surgery.     Physical Exam   Triage Vital Signs: ED Triage Vitals  Enc Vitals Group     BP 05/03/22 1913 118/81     Pulse Rate 05/03/22 1913 97     Resp 05/03/22 1913 20     Temp 05/03/22 1913 98.8 F (37.1 C)     Temp Source 05/03/22 1913 Oral     SpO2 05/03/22 1913 100 %     Weight 05/03/22 1911 67.6 kg (149 lb)     Height 05/03/22 1911 1.575 m (5\' 2" )     Head Circumference --      Peak Flow --      Pain Score 05/03/22 1911 8     Pain Loc --      Pain Edu? --      Excl. in [place]? --     Most recent vital signs: Vitals:   05/03/22 1913  BP: 118/81  Pulse: 97  Resp: 20  Temp: 98.8 F (37.1 C)  SpO2: 100%     General: Awake, no distress.  CV:  Good peripheral perfusion.  Resp:  Normal effort.  Abd:  No distention.  Other:  GYN: Cuff appears intact, yellowish discharge, area is tender to palpation with Q-tip   ED Results / Procedures / Treatments   Labs (all labs ordered are listed, but only abnormal results are displayed) Labs Reviewed  WET PREP, GENITAL - Abnormal; Notable for the following components:      Result Value   Clue Cells Wet Prep HPF POC PRESENT (*)    WBC, Wet Prep HPF POC >=10 (*)    All other components within normal limits  COMPREHENSIVE METABOLIC PANEL - Abnormal; Notable for the following components:   Creatinine, Ser 1.07 (*)    AST 11 (*)    All other  components within normal limits  CBC - Abnormal; Notable for the following components:   WBC 11.0 (*)    All other components within normal limits  URINALYSIS, ROUTINE W REFLEX MICROSCOPIC - Abnormal; Notable for the following components:   Color, Urine YELLOW (*)    APPearance CLEAR (*)    Ketones, ur 5 (*)    Leukocytes,Ua SMALL (*)    All other components within normal limits  CHLAMYDIA/NGC RT PCR ([facility] ONLY)            LIPASE, BLOOD     EKG     RADIOLOGY     PROCEDURES:  Critical Care performed:   Procedures   MEDICATIONS ORDERED IN ED: Medications  metroNIDAZOLE (FLAGYL) tablet 500 mg (has no administration in time range)  HYDROcodone-acetaminophen (NORCO/VICODIN) 5-325 MG per tablet 1 tablet (has no administration in time range)     IMPRESSION / MDM / ASSESSMENT AND PLAN / ED COURSE  I reviewed the triage vital signs and the nursing notes. Patient's presentation  is most consistent with acute illness / injury with system symptoms.  Patient presents with symptoms as above.  Lab work reviewed, mild elevation of white blood cell count is nonspecific, CMP is unremarkable.  Urinalysis overall unremarkable  Consulted with GYN team, Paula Compton and Dr. Logan Bores they feel this is likely related to granulation tissue/inflammation possibly caused by bacterial vaginosis and recommend Flagyl x1 week, close outpatient follow-up with GYN  Discussed with patient she agrees with this plan        FINAL CLINICAL IMPRESSION(S) / ED DIAGNOSES   Final diagnoses:  Bacterial vaginosis     Rx / DC Orders   ED Discharge Orders     None        Note:  This document was prepared using Dragon voice recognition software and may include unintentional dictation errors.   Jene Every, MD 05/03/22 2157

## 2022-05-06 ENCOUNTER — Ambulatory Visit (INDEPENDENT_AMBULATORY_CARE_PROVIDER_SITE_OTHER): Payer: 59 | Admitting: Urology

## 2022-05-06 ENCOUNTER — Encounter: Payer: Self-pay | Admitting: Urology

## 2022-05-06 VITALS — BP 118/85 | HR 79 | Ht 62.0 in | Wt 149.0 lb

## 2022-05-06 DIAGNOSIS — Z87440 Personal history of urinary (tract) infections: Secondary | ICD-10-CM

## 2022-05-06 DIAGNOSIS — N390 Urinary tract infection, site not specified: Secondary | ICD-10-CM

## 2022-05-06 LAB — URINALYSIS, ROUTINE W REFLEX MICROSCOPIC
Bilirubin, UA: NEGATIVE
Glucose, UA: NEGATIVE
Nitrite, UA: NEGATIVE
Protein,UA: NEGATIVE
RBC, UA: NEGATIVE
Specific Gravity, UA: 1.025 (ref 1.005–1.030)
Urobilinogen, Ur: 0.2 mg/dL (ref 0.2–1.0)
pH, UA: 5 (ref 5.0–7.5)

## 2022-05-06 LAB — MICROSCOPIC EXAMINATION

## 2022-05-06 NOTE — Progress Notes (Signed)
05/06/2022 9:18 AM   Sarah Eaton [birth year] [id number]  Referring provider: Anabel Halon, MD [address] [place],  [place] [address]  No chief complaint on file.   HPI:  F/u -    1) left flank pain - noted Dec 2022. US renal benign. WBC was 9.5. Cr 0.87. UA clear. No cx. Nov 2022 Cx < 10 k. Pain again Feb 2023. No dysuria. She did have frequency. Urine culture February 2023 grew 50-100,000 pansensitive E. coli.  Note this was an improvement from her prior resistant cultures.  She had left lower quadrant pain.  CT scan of the abdomen and pelvis was obtained 01/04/2022 which showed a benign urinary tract.  No sign of renal mass, hydronephrosis, stone or pyelonephritis.She took two abx - sounds like Bactrim and Augmentin.      2) pyelonephritis - seen as hospital consult 05/04/2021 for a left pyelonephritis. CT scan of the abdomen and pelvis showed a lobar nephronia/focal nephritis of the left kidney upper pole about 3 cm, but no focal abscess/fluid to drain. Renal ultrasound was obtained the next day 05/05/2021 and again no fluid collection was noted.  ID was consulted and urine culture from outside hospital grew ESBL E. coli.  She was switched from meropenem to ceftriaxone and did well and discharged on cefdinir 300 mg p.o. twice daily to finish a duration of 10 days. F/u US 06/25/2021 noted there to be no obvious lingering abnormality, scarring or fluid collection.   No prior h/o kidney infections but parents told her she did have UTIs as a child. No GU surgery. Constipation.    Sarah Eaton returns in management of the above.  She underwent a lap assisted vaginal hysterectomy and bilateral salpingectomy in May 2023 she had some postoperative pain and vaginal drainage for which she underwent a CT scan of the abdomen and pelvis May 2023.  This afforded [place] another look at her urinary tract which appeared benign without mass, stone or hydronephrosis.  She had another recent June 2023 spell of  abdominal pain with a white count of 11 creatinine of 1.07, UA with 11-20 white cells, but no bacteria.  Wet prep showed white blood cells and clue cells.  She was treated with Flagyl. She had some painful urination and SP pressure/pain which has improved. She has had dark urine but no gross hematuria. She still has some residual SP and pelvic pressure. Voiding with a good stream. She has constipation. She is working on it. She might try Linzess. She has no frequency but some urgency. No incontinence.   She is an Airline pilot at [employer].      PMH: Past Medical History:  Diagnosis Date   Anemia    Hypertension    Obesity    Ovarian cyst    Pyelonephritis 05/04/2021   Sepsis due to Escherichia coli (E. coli) (HCC) 05/04/2021    Surgical History: Past Surgical History:  Procedure Laterality Date   CESAREAN SECTION     CESAREAN SECTION WITH BILATERAL TUBAL LIGATION Bilateral 08/05/2013   Procedure: REPEAT CESAREAN SECTION WITH BILATERAL TUBAL LIGATION;  Surgeon: Sarah Bossier, MD;  Location: [facility];  Service: Obstetrics;  Laterality: Bilateral;   ENDOMETRIAL ABLATION W/ NOVASURE  09/2014   LAPAROSCOPIC VAGINAL HYSTERECTOMY WITH SALPINGECTOMY N/A 04/01/2022   Procedure: LAPAROSCOPIC ASSISTED VAGINAL HYSTERECTOMY WITH SALPINGECTOMY;  Surgeon: Sarah Laser, MD;  Location: [facility];  Service: Gynecology;  Laterality: N/A;   TUBAL LIGATION      Home Medications:  Allergies as of 05/06/2022       Reactions   Banana Anaphylaxis   Sarah Eaton [fish Allergy] Anaphylaxis   Tuna fish only per pt        Medication List        Accurate as of May 06, 2022  9:18 AM. If you have any questions, ask your nurse or doctor.          diphenhydrAMINE 25 MG tablet Commonly known as: BENADRYL Take 25 mg by mouth every 6 (six) hours as needed.   HYDROmorphone 2 MG tablet Commonly known as: DILAUDID Take 1 tablet (2 mg total) by mouth every 4 (four) hours as needed for severe pain.    ibuprofen 600 MG tablet Commonly known as: ADVIL Take 1 tablet (600 mg total) by mouth every 6 (six) hours as needed.   metroNIDAZOLE 500 MG tablet Commonly known as: FLAGYL Take 1 tablet (500 mg total) by mouth 2 (two) times daily after a meal.   ondansetron 4 MG disintegrating tablet Commonly known as: ZOFRAN-ODT Take 1 tablet (4 mg total) by mouth every 8 (eight) hours as needed for nausea or vomiting.   phentermine 37.5 MG capsule Take 1 capsule (37.5 mg total) by mouth every morning.   rizatriptan 10 MG tablet Commonly known as: MAXALT TAKE 1 TABLET BY MOUTH AS NEEDED FOR MIGRAINE. MAY REPEAT IN 2 HOURS IF NEEDED   Semaglutide-Weight Management 1 MG/0.5ML Soaj Inject 1 mg into the skin once a week for 28 days.   Semaglutide-Weight Management 1.7 MG/0.75ML Soaj Inject 1.7 mg into the skin once a week for 28 days. Start taking on: May 18, 2022   Semaglutide-Weight Management 2.4 MG/0.75ML Soaj Inject 2.4 mg into the skin once a week for 28 days. Start taking on: June 16, 2022   topiramate 25 MG tablet Commonly known as: TOPAMAX TAKE 1 TABLET BY MOUTH TWICE A DAY   traMADol 50 MG tablet Commonly known as: Ultram Take 1 tablet (50 mg total) by mouth every 6 (six) hours as needed.        Allergies:  Allergies  Allergen Reactions   Banana Anaphylaxis   Sarah Eaton [Fish Allergy] Anaphylaxis    Tuna fish only per pt    Family History: Family History  Problem Relation Age of Onset   Cancer Other        breast   Hypertension Mother    Cancer Mother        skin   Thyroid disease Mother    Cancer Maternal Grandmother    Hypertension Maternal Grandmother    Thyroid disease Father    Thyroid disease Paternal Grandmother     Social History:  reports that she has never smoked. She has never used smokeless tobacco. She reports that she does not currently use alcohol. She reports that she does not use drugs.   Physical Exam: BP 118/85   Pulse 79   Ht 5\' 2"  (1.575  m)   Wt 149 lb (67.6 kg)   LMP 02/16/2022   BMI 27.25 kg/m   Constitutional:  Alert and oriented, No acute distress. HEENT: [place] AT, moist mucus membranes.  Trachea midline, no masses. Cardiovascular: No clubbing, cyanosis, or edema. Respiratory: Normal respiratory effort, no increased work of breathing. GI: Abdomen is soft, nontender, nondistended, no abdominal masses GU: No CVA tenderness Skin: No rashes, bruises or suspicious lesions. Neurologic: Grossly intact, no focal deficits, moving all 4 extremities. Psychiatric: Normal mood and affect.  Laboratory Data: Lab Results  Component Value Date   WBC 11.0 (H) 05/03/2022   HGB 13.5 05/03/2022   HCT 39.8 05/03/2022   MCV 89.6 05/03/2022   PLT 319 05/03/2022    Lab Results  Component Value Date   CREATININE 1.07 (H) 05/03/2022    No results found for: "PSA"  Lab Results  Component Value Date   TESTOSTERONE 12 03/13/2018    Lab Results  Component Value Date   HGBA1C 4.9 07/01/2019    Urinalysis    Component Value Date/Time   COLORURINE YELLOW (A) 05/03/2022 1913   APPEARANCEUR CLEAR (A) 05/03/2022 1913   APPEARANCEUR Clear 01/07/2022 1425   LABSPEC 1.021 05/03/2022 1913   LABSPEC 1.026 08/09/2014 1750   PHURINE 6.0 05/03/2022 1913   GLUCOSEU NEGATIVE 05/03/2022 1913   GLUCOSEU Negative 08/09/2014 1750   HGBUR NEGATIVE 05/03/2022 1913   BILIRUBINUR NEGATIVE 05/03/2022 1913   BILIRUBINUR negative 05/03/2022 1831   BILIRUBINUR Negative 01/07/2022 1425   BILIRUBINUR Negative 08/09/2014 1750   KETONESUR 5 (A) 05/03/2022 1913   KETONESUR negative 05/03/2022 1831   PROTEINUR NEGATIVE 05/03/2022 1913   PROTEINUR negative 05/03/2022 1831   UROBILINOGEN 0.2 05/03/2022 1831   UROBILINOGEN 0.2 06/11/2014 1755   NITRITE NEGATIVE 05/03/2022 1913   NITRITE Negative 05/03/2022 1831   LEUKOCYTESUR SMALL (A) 05/03/2022 1913   LEUKOCYTESUR Small (1+) (A) 05/03/2022 1831   LEUKOCYTESUR Negative 08/09/2014 1750    Lab  Results  Component Value Date   LABMICR Comment 01/07/2022   BACTERIA NONE SEEN 05/03/2022    Pertinent Imaging: CT a/p - May 2023 - images reviewed   Results for orders placed during the hospital encounter of 11/11/21  US Renal  Narrative CLINICAL DATA:  Flank pain, history of pyelonephritis  EXAM: RENAL / URINARY TRACT ULTRASOUND COMPLETE  COMPARISON:  06/25/2021  FINDINGS: Right Kidney:  Renal measurements: 10.2 x 5.6 x 4.7 cm = volume: 140 mL. Normal cortical thickness and echogenicity. No mass, hydronephrosis, or shadowing calcification.  Left Kidney:  Renal measurements: 9.7 x 5.1 x 5.0 cm = volume: 130 mL. Normal cortical thickness and echogenicity. No mass, hydronephrosis, or shadowing calcification.  Bladder:  Unable to adequately assessed, decompressed.  Other:  N/A  IMPRESSION: Normal sonographic appearance of kidneys.  Decompressed bladder.   Electronically Signed By: Ulyses Southward M.D. On: 11/11/2021 21:00  No results found for this or any previous visit.  No results found for this or any previous visit.  No results found for this or any previous visit.   Assessment & Plan:    1. Recurrent UTI No evidence of UTI or bladder pathology this year. Continue to work on constipation and stay well hydrated.  - Urinalysis, Routine w reflex microscopic   No follow-ups on file.  Jerilee Field, MD  [facility]  [address] [place], [place] [address] [phone]

## 2022-05-07 ENCOUNTER — Encounter: Payer: Self-pay | Admitting: Obstetrics and Gynecology

## 2022-05-07 DIAGNOSIS — G8918 Other acute postprocedural pain: Secondary | ICD-10-CM

## 2022-05-07 LAB — CERVICOVAGINAL ANCILLARY ONLY
Bacterial Vaginitis (gardnerella): NEGATIVE
Candida Glabrata: NEGATIVE
Candida Vaginitis: NEGATIVE
Comment: NEGATIVE
Comment: NEGATIVE
Comment: NEGATIVE

## 2022-05-08 ENCOUNTER — Emergency Department (HOSPITAL_COMMUNITY): Payer: 59

## 2022-05-08 ENCOUNTER — Encounter (HOSPITAL_COMMUNITY): Payer: Self-pay | Admitting: Pharmacy Technician

## 2022-05-08 ENCOUNTER — Emergency Department (HOSPITAL_COMMUNITY)
Admission: EM | Admit: 2022-05-08 | Discharge: 2022-05-09 | Disposition: A | Discharge status: Home / Self Care | Payer: 59 | Attending: Emergency Medicine | Admitting: Emergency Medicine

## 2022-05-08 ENCOUNTER — Other Ambulatory Visit: Payer: Self-pay

## 2022-05-08 DIAGNOSIS — R509 Fever, unspecified: Secondary | ICD-10-CM | POA: Diagnosis not present

## 2022-05-08 DIAGNOSIS — R109 Unspecified abdominal pain: Secondary | ICD-10-CM | POA: Diagnosis present

## 2022-05-08 DIAGNOSIS — N760 Acute vaginitis: Secondary | ICD-10-CM | POA: Diagnosis not present

## 2022-05-08 DIAGNOSIS — I10 Essential (primary) hypertension: Secondary | ICD-10-CM | POA: Insufficient documentation

## 2022-05-08 LAB — URINALYSIS, ROUTINE W REFLEX MICROSCOPIC
Bilirubin Urine: NEGATIVE
Glucose, UA: NEGATIVE mg/dL
Hgb urine dipstick: NEGATIVE
Ketones, ur: NEGATIVE mg/dL
Leukocytes,Ua: NEGATIVE
Nitrite: NEGATIVE
Protein, ur: NEGATIVE mg/dL
Specific Gravity, Urine: 1.016 (ref 1.005–1.030)
pH: 6 (ref 5.0–8.0)

## 2022-05-08 LAB — COMPREHENSIVE METABOLIC PANEL
ALT: 11 U/L (ref 0–44)
AST: 12 U/L — ABNORMAL LOW (ref 15–41)
Albumin: 4.1 g/dL (ref 3.5–5.0)
Alkaline Phosphatase: 51 U/L (ref 38–126)
Anion gap: 9 (ref 5–15)
BUN: 13 mg/dL (ref 6–20)
CO2: 22 mmol/L (ref 22–32)
Calcium: 9.3 mg/dL (ref 8.9–10.3)
Chloride: 108 mmol/L (ref 98–111)
Creatinine, Ser: 0.95 mg/dL (ref 0.44–1.00)
GFR, Estimated: >60 mL/min (ref >60)
Glucose, Bld: 90 mg/dL (ref 70–99)
Potassium: 4.3 mmol/L (ref 3.5–5.1)
Sodium: 139 mmol/L (ref 135–145)
Total Bilirubin: 0.3 mg/dL (ref 0.3–1.2)
Total Protein: 6.8 g/dL (ref 6.5–8.1)

## 2022-05-08 LAB — CBC
HCT: 40.1 % (ref 36.0–46.0)
Hemoglobin: 13.6 g/dL (ref 12.0–15.0)
MCH: 30.8 pg (ref 26.0–34.0)
MCHC: 33.9 g/dL (ref 30.0–36.0)
MCV: 90.7 fL (ref 80.0–100.0)
Platelets: 358 10*3/uL (ref 150–400)
RBC: 4.42 MIL/uL (ref 3.87–5.11)
RDW: 12.5 % (ref 11.5–15.5)
WBC: 7.6 10*3/uL (ref 4.0–10.5)
nRBC: 0 % (ref 0.0–0.2)

## 2022-05-08 LAB — LIPASE, BLOOD: Lipase: 30 U/L (ref 11–51)

## 2022-05-08 LAB — I-STAT BETA HCG BLOOD, ED (MC, WL, AP ONLY): I-stat hCG, quantitative: <5 m[IU]/mL (ref <5)

## 2022-05-08 NOTE — ED Triage Notes (Signed)
Pt here with reports of abdominal pain, vaginal drainage and fevers. States hysterectomy approx 1 month ago. Called her obgyn who told her to come here for possible abdominal abscess. Pt currently on abx.

## 2022-05-08 NOTE — Telephone Encounter (Signed)
Patient called back to be advised about message that she left before. I spoke with Dr. Logan Bores and he advised that the patient be scheduled for an ultrasound to rule out an abdominal abscess. She was negative for UTI. Has been taking antibiotics for bacterial vaginosis and continues to have a low grade fever and discharge.

## 2022-05-08 NOTE — ED Provider Triage Note (Cosign Needed)
Emergency Medicine Provider Triage Evaluation Note  Sarah Eaton , a 37 y.o. female  was evaluated in triage.  Pt complains of abd pain. 5/15 had hysterectomy.  States 1 week of abd pain and vaginal DC. Seems she has been uncomfortable since hysterectomy but sx much worse.   States occasional suprapubic pain with urination but no burning w urination.   No vomiting.   Endorses fevers.   Review of Systems  Positive: Abd pain, vaginal DC Negative: CP  Physical Exam  BP (!) 151/106   Pulse 77   Temp 98.3 F (36.8 C) (Oral)   Resp 20   LMP 02/16/2022   SpO2 99%  Gen:   Awake, no distress   Resp:  Normal effort  MSK:   Moves extremities without difficulty  Other:  Abd  soft mild TTP of left pelvic/lower abd area  Medical Decision Making  Medically screening exam initiated at 7:10 PM.  Appropriate orders placed.  Sarah Eaton was informed that the remainder of the evaluation will be completed by another provider, this initial triage assessment does not replace that evaluation, and the importance of remaining in the ED until their evaluation is complete.  [address], [place]   [place], [place] 05/08/22 [phone]

## 2022-05-09 ENCOUNTER — Emergency Department (HOSPITAL_COMMUNITY): Payer: 59

## 2022-05-09 MED ORDER — IOHEXOL 300 MG/ML  SOLN
80.0000 mL | Freq: Once | INTRAMUSCULAR | Status: AC | PRN
  Administered 2022-05-09: 80 mL via INTRAVENOUS

## 2022-05-09 MED ORDER — AMOXICILLIN-POT CLAVULANATE 875-125 MG PO TABS
1.0000 | ORAL_TABLET | Freq: Once | ORAL | Status: AC
  Administered 2022-05-09: 1 via ORAL
  Filled 2022-05-09: qty 1

## 2022-05-09 MED ORDER — AMOXICILLIN-POT CLAVULANATE 875-125 MG PO TABS: 1.0000 | ORAL_TABLET | Freq: Two times a day (BID) | ORAL | 0 refills | Status: AC

## 2022-05-09 NOTE — Discharge Instructions (Signed)
You were evaluated in the Emergency Department and after careful evaluation, we did not find any emergent condition requiring admission or further testing in the hospital.  Your exam/testing today is overall reassuring.  CT and ultrasound did not show any abscesses or other emergencies.  Your continued symptoms may be due to a cuff infection.  Take the Augmentin antibiotic as prescribed.  You can stop the Flagyl.  Follow-up closely with OB/GYN.  Please return to the Emergency Department if you experience any worsening of your condition.   Thank you for allowing [place] to be a part of your care.

## 2022-05-09 NOTE — Telephone Encounter (Signed)
Patient was seen at ED yesterday. Urine was obtained and resulted negative. Ultrasound was also done.   Notes from ED:  Suspicious for cuff infection, utility of pelvic exam at this time seems to be low yield.  Still it was offered to patient and she declined.  Her culture for bacterial vaginosis was negative and so we will stop the Flagyl and start patient on Augmentin to treat empirically for cuff infection, she has close follow-up with OB/GYN.  Ultrasound Impression:  Evidence of recent hysterectomy without evidence of an acute or active process within the abdomen or pelvis.

## 2022-05-10 ENCOUNTER — Ambulatory Visit: Payer: 59

## 2022-05-14 NOTE — Progress Notes (Signed)
    OBSTETRICS/GYNECOLOGY POST-OPERATIVE CLINIC VISIT  Subjective:     Sarah Eaton is a 36 y.o. female who presents to the clinic 6 weeks status post LAPAROSCOPIC ASSISTED VAGINAL HYSTERECTOMY WITH SALPINGECTOMY for dysfunctional uterine bleeding, S/P endometrial ablation. Eating a regular diet without difficulty. Bowel movements are abnormal with constipation . The patient is not having any pain. Patient states she is doing well. Pain has resolved. Incision is healing well.  She reports that she was seen in the ER twice last week due to concerns for possible vaginal infection after her hysterectomy, was initially started on Flagyl however this was not helping so she returned to the emergency room several days later for further evaluation with ultrasound to rule out pelvic abscess (which was negative), but was started on Augmentin to rule out possible vaginal cuff cellulitis, due to low-grade fevers.  Patient notes that she is still taking antibiotics for the infection, is overall feeling better.  The following portions of the patient's history were reviewed and updated as appropriate: allergies, current medications, past family history, past medical history, past social history, past surgical history, and problem list.  Review of Systems Pertinent items noted in HPI and remainder of comprehensive ROS otherwise negative.   Objective:   BP 104/85   Pulse 97   Resp 16   Ht 5\' 2"  (1.575 m)   Wt 149 lb 11.2 oz (67.9 kg)   LMP 02/16/2022   BMI 27.38 kg/m  Body mass index is 27.38 kg/m.  General:  alert and no distress  Abdomen: soft, bowel sounds active, non-tender  Incision:   healing well, no drainage, no erythema, no hernia, no seroma, no swelling, no dehiscence, incision well approximated  Pelvis: External genitalia appears normal.  Vagina with well-healing vaginal cuff, 2 areas of granular tissue noted at the midline of the cuff and on the posterior left wall.  Scant thin yellow  discharge present, without odor.  Uterus and cervix surgically absent.  Bimanual exam not performed.    Pathology:   A. UTERUS WITH CERVIX; HYSTERECTOMY:  - CERVIX WITHOUT PATHOLOGIC CHANGES.  - BENIGN ENDOMETRIUM WITH PATCHY UNDERDEVELOPED SECRETORY CHANGE.  - SCARRING OF ENDOMETRIAL CAVITY CONSISTENT WITH PREVIOUS ABLATION.  - ADENOMYOSIS.   BILATERAL FALLOPIAN TUBES; SALPINGECTOMY:  - NO PATHOLOGIC CHANGES.    Assessment:   Patient s/p LAPAROSCOPIC ASSISTED VAGINAL HYSTERECTOMY WITH SALPINGECTOMY (surgery)  History of endometrial ablation Adenomyosis Vaginal cuff cellulitis   Plan:   1. Continue any current medications as instructed by provider. 2. Wound care discussed. 3. Operative findings again reviewed. Pathology report discussed. 4. Activity restrictions:  Pelvic rest x1 week.  Can resume all other activities. 5. Anticipated return to work: now. 6. Follow up:  As needed for any routine gynecologic care.      04/18/2022, MD [facility]

## 2022-05-16 ENCOUNTER — Ambulatory Visit (INDEPENDENT_AMBULATORY_CARE_PROVIDER_SITE_OTHER): Payer: 59 | Admitting: Obstetrics and Gynecology

## 2022-05-16 ENCOUNTER — Encounter: Payer: Self-pay | Admitting: Obstetrics and Gynecology

## 2022-05-16 VITALS — BP 104/85 | HR 97 | Resp 16 | Ht 62.0 in | Wt 149.7 lb

## 2022-05-16 DIAGNOSIS — N8003 Adenomyosis of the uterus: Secondary | ICD-10-CM

## 2022-05-16 DIAGNOSIS — Z9889 Other specified postprocedural states: Secondary | ICD-10-CM

## 2022-05-16 DIAGNOSIS — Z4889 Encounter for other specified surgical aftercare: Secondary | ICD-10-CM

## 2022-05-16 DIAGNOSIS — Z90710 Acquired absence of both cervix and uterus: Secondary | ICD-10-CM

## 2022-05-16 DIAGNOSIS — N760 Acute vaginitis: Secondary | ICD-10-CM

## 2022-05-22 ENCOUNTER — Other Ambulatory Visit: Payer: Self-pay | Admitting: Obstetrics and Gynecology

## 2022-05-22 ENCOUNTER — Encounter: Payer: Self-pay | Admitting: Obstetrics and Gynecology

## 2022-05-22 MED ORDER — SULFAMETHOXAZOLE-TRIMETHOPRIM 800-160 MG PO TABS: 1.0000 | ORAL_TABLET | Freq: Two times a day (BID) | ORAL | 1 refills | Status: DC

## 2022-05-24 ENCOUNTER — Ambulatory Visit: Payer: 59 | Admitting: Internal Medicine

## 2022-06-19 ENCOUNTER — Encounter: Payer: Self-pay | Admitting: Internal Medicine

## 2022-06-19 ENCOUNTER — Ambulatory Visit (INDEPENDENT_AMBULATORY_CARE_PROVIDER_SITE_OTHER): Payer: 59 | Admitting: Internal Medicine

## 2022-06-19 ENCOUNTER — Encounter (HOSPITAL_BASED_OUTPATIENT_CLINIC_OR_DEPARTMENT_OTHER): Payer: Self-pay | Admitting: Urology

## 2022-06-19 ENCOUNTER — Ambulatory Visit: Payer: Self-pay

## 2022-06-19 ENCOUNTER — Emergency Department (HOSPITAL_BASED_OUTPATIENT_CLINIC_OR_DEPARTMENT_OTHER)
Admission: EM | Admit: 2022-06-19 | Discharge: 2022-06-19 | Disposition: A | Discharge status: Home / Self Care | Payer: 59 | Attending: Emergency Medicine | Admitting: Emergency Medicine

## 2022-06-19 DIAGNOSIS — M791 Myalgia, unspecified site: Secondary | ICD-10-CM

## 2022-06-19 DIAGNOSIS — R509 Fever, unspecified: Secondary | ICD-10-CM | POA: Diagnosis not present

## 2022-06-19 DIAGNOSIS — Z20822 Contact with and (suspected) exposure to covid-19: Secondary | ICD-10-CM | POA: Insufficient documentation

## 2022-06-19 LAB — COMPREHENSIVE METABOLIC PANEL
ALT: 19 U/L (ref 0–44)
AST: 16 U/L (ref 15–41)
Albumin: 4 g/dL (ref 3.5–5.0)
Alkaline Phosphatase: 48 U/L (ref 38–126)
Anion gap: 6 (ref 5–15)
BUN: 14 mg/dL (ref 6–20)
CO2: 23 mmol/L (ref 22–32)
Calcium: 9 mg/dL (ref 8.9–10.3)
Chloride: 107 mmol/L (ref 98–111)
Creatinine, Ser: 0.89 mg/dL (ref 0.44–1.00)
GFR, Estimated: >60 mL/min (ref >60)
Glucose, Bld: 89 mg/dL (ref 70–99)
Potassium: 3.9 mmol/L (ref 3.5–5.1)
Sodium: 136 mmol/L (ref 135–145)
Total Bilirubin: 0.3 mg/dL (ref 0.3–1.2)
Total Protein: 7.2 g/dL (ref 6.5–8.1)

## 2022-06-19 LAB — CBC WITH DIFFERENTIAL/PLATELET
Abs Immature Granulocytes: 0.01 10*3/uL (ref 0.00–0.07)
Basophils Absolute: 0 10*3/uL (ref 0.0–0.1)
Basophils Relative: 0 %
Eosinophils Absolute: 0.1 10*3/uL (ref 0.0–0.5)
Eosinophils Relative: 1 %
HCT: 39.3 % (ref 36.0–46.0)
Hemoglobin: 13.3 g/dL (ref 12.0–15.0)
Immature Granulocytes: 0 %
Lymphocytes Relative: 37 %
Lymphs Abs: 2.5 10*3/uL (ref 0.7–4.0)
MCH: 30.7 pg (ref 26.0–34.0)
MCHC: 33.8 g/dL (ref 30.0–36.0)
MCV: 90.8 fL (ref 80.0–100.0)
Monocytes Absolute: 0.4 10*3/uL (ref 0.1–1.0)
Monocytes Relative: 6 %
Neutro Abs: 3.7 10*3/uL (ref 1.7–7.7)
Neutrophils Relative %: 56 %
Platelets: 277 10*3/uL (ref 150–400)
RBC: 4.33 MIL/uL (ref 3.87–5.11)
RDW: 12.4 % (ref 11.5–15.5)
WBC: 6.6 10*3/uL (ref 4.0–10.5)
nRBC: 0 % (ref 0.0–0.2)

## 2022-06-19 LAB — URINALYSIS, ROUTINE W REFLEX MICROSCOPIC
Bilirubin Urine: NEGATIVE
Glucose, UA: NEGATIVE mg/dL
Hgb urine dipstick: NEGATIVE
Ketones, ur: NEGATIVE mg/dL
Leukocytes,Ua: NEGATIVE
Nitrite: NEGATIVE
Protein, ur: NEGATIVE mg/dL
Specific Gravity, Urine: 1.025 (ref 1.005–1.030)
pH: 6.5 (ref 5.0–8.0)

## 2022-06-19 LAB — LIPASE, BLOOD: Lipase: 32 U/L (ref 11–51)

## 2022-06-19 LAB — PREGNANCY, URINE: Preg Test, Ur: NEGATIVE

## 2022-06-19 LAB — SARS CORONAVIRUS 2 BY RT PCR: SARS Coronavirus 2 by RT PCR: NEGATIVE

## 2022-06-19 NOTE — ED Notes (Signed)
Fever x 2 days, body aches and n/v & abdominal pain

## 2022-06-19 NOTE — Discharge Instructions (Signed)
Your work-up today was reassuring.  No signs of sepsis or infection.  Follow-up with your primary next week if your symptoms persist, you can take Tylenol Motrin for fevers.  Return to ED for new or concerning symptoms.

## 2022-06-19 NOTE — ED Triage Notes (Signed)
Pt states fever and chills x 2 days with body aches Denies N/V Reports slight abdominal pain  Was hospitalized in past for kidney infection in past  No urinary symptoms at this time

## 2022-06-19 NOTE — ED Provider Notes (Signed)
[facility] EMERGENCY DEPARTMENT Provider Note   CSN: [id number] Arrival date & time: 06/19/22  1758     History  Chief Complaint  Patient presents with   Fever    Sarah Eaton is a 36 y.o. female.   Fever   Patient with medical history of hypertension, status post hysterectomy, recurrent UTIs presents today due to fever.  Patient had a fever over the weekend, Tmax 102.  She has been having generalized body aches, called her primary doctor who advised ED evaluation given patient's history of UTIs with complicated sepsis.  Patient denies taking any antipyretics prior to arrival and is afebrile during triage.  She denies any nausea, vomiting, abdominal pain, chest pain, shortness of breath, diarrhea, dysuria, hematuria, headaches.  She personally has not been coughing but was around sick contacts 2 weeks ago at her work, also treated for UTI 3 weeks ago.  Home Medications Prior to Admission medications   Medication Sig Start Date End Date Taking? Authorizing Provider  ibuprofen (ADVIL) 600 MG tablet Take 1 tablet (600 mg total) by mouth every 6 (six) hours as needed. 04/01/22   Hildred Laser, MD  ondansetron (ZOFRAN-ODT) 4 MG disintegrating tablet Take 1 tablet (4 mg total) by mouth every 8 (eight) hours as needed for nausea or vomiting. 04/14/22   Irean Hong, MD  phentermine 37.5 MG capsule Take 1 capsule (37.5 mg total) by mouth every morning. 02/13/22   Anabel Halon, MD  rizatriptan (MAXALT) 10 MG tablet TAKE 1 TABLET BY MOUTH AS NEEDED FOR MIGRAINE. MAY REPEAT IN 2 HOURS IF NEEDED 01/07/22   Anabel Halon, MD  Semaglutide-Weight Management 2.4 MG/0.75ML SOAJ Inject 2.4 mg into the skin once a week for 28 days. 06/16/22 07/14/22  Anabel Halon, MD  topiramate (TOPAMAX) 25 MG tablet TAKE 1 TABLET BY MOUTH TWICE A DAY 01/15/22   Anabel Halon, MD      Allergies    Banana and [place] Bing allergy]    Review of Systems   Review of Systems  Constitutional:  Positive  for fever.    Physical Exam Updated Vital Signs BP (!) 124/95   Pulse 94   Temp 98.4 F (36.9 C) (Oral)   Resp 19   Ht 5\' 2"  (1.575 m)   Wt 67.9 kg   LMP 02/16/2022   SpO2 100%   BMI 27.38 kg/m  Physical Exam Vitals and nursing note reviewed. Exam conducted with a chaperone present.  Constitutional:      Appearance: Normal appearance.  HENT:     Head: Normocephalic and atraumatic.  Eyes:     General: No scleral icterus.       Right eye: No discharge.        Left eye: No discharge.     Extraocular Movements: Extraocular movements intact.     Pupils: Pupils are equal, round, and reactive to light.  Cardiovascular:     Rate and Rhythm: Normal rate and regular rhythm.     Pulses: Normal pulses.     Heart sounds: Normal heart sounds. No murmur heard.    No friction rub. No gallop.  Pulmonary:     Effort: Pulmonary effort is normal. No respiratory distress.     Breath sounds: Normal breath sounds.  Abdominal:     General: Abdomen is flat. Bowel sounds are normal. There is no distension.     Palpations: Abdomen is soft.     Tenderness: There is no abdominal tenderness.  Comments: Abdomen is soft and nontender  Skin:    General: Skin is warm and dry.     Coloration: Skin is not jaundiced.  Neurological:     Mental Status: She is alert. Mental status is at baseline.     Coordination: Coordination normal.     ED Results / Procedures / Treatments   Labs (all labs ordered are listed, but only abnormal results are displayed) Labs Reviewed  SARS CORONAVIRUS 2 BY RT PCR  URINALYSIS, ROUTINE W REFLEX MICROSCOPIC  PREGNANCY, URINE  CBC WITH DIFFERENTIAL/PLATELET  COMPREHENSIVE METABOLIC PANEL  LIPASE, BLOOD    EKG None  Radiology No results found.  Procedures Procedures    Medications Ordered in ED Medications - No data to display  ED Course/ Medical Decision Making/ A&P                           Medical Decision Making Amount and/or Complexity of Data  Reviewed Labs: ordered.   Patient presents due to fever.  Differential includes not limited to URI, sepsis, UTI, pyelonephritis.  On exam patient is neurovascular intact with brisk cap refill.  No abdominal tenderness, lungs clear to auscultation bilaterally.  Vital signs are stable and she is notably not febrile.  Does not meet SIRS criteria.  I ordered and viewed laboratory work-up.  There is no leukocytosis or anemia, CMP is without gross electrolyte derangement or AKI.  Lipase is within normal limits, COVID is negative.  Urine is without any signs of underlying infection.  Patient was on cardiac monitoring and is in sinus rhythm.  On reevaluation patient is resting comfortably.  No new symptoms.  Suspect patient likely had a viral infection.  Her lungs are clear to auscultation, no hypoxia or coughing so I do not think an x-ray is indicated.  She does have some generalized body aches that is negative for COVID.  Laboratory work-up is overall reassuring and she does not have any risk factors that would be suggestive of [place].  She is not septic.  Stabilized at this time and appropriate for outpatient follow-up I do not see indication for additional ED evaluation.        Final Clinical Impression(s) / ED Diagnoses Final diagnoses:  None    Rx / DC Orders ED Discharge Orders     None         Theron Arista, PA-C 06/19/22 1957    Virgina Norfolk, DO 06/19/22 2020

## 2022-06-19 NOTE — Progress Notes (Signed)
     Virtual Visit via Telephone Note   This visit type was conducted due to national recommendations for restrictions regarding the COVID-19 Pandemic (e.g. social distancing) in an effort to limit this patient's exposure and mitigate transmission in our community.  Due to her co-morbid illnesses, this patient is at least at moderate risk for complications without adequate follow up.  This format is felt to be most appropriate for this patient at this time.  The patient did not have access to video technology/had technical difficulties with video requiring transitioning to audio format only (telephone).  All issues noted in this document were discussed and addressed.  No physical exam could be performed with this format.  Evaluation Performed:  Follow-up visit  Date:  06/19/2022   ID:  Sarah Eaton, DOB 1986/04/03, MRN [id number]  Patient Location: Home Provider Location: Office/Clinic  Participants: Patient Location of Patient: Home Location of Provider: Telehealth Consent was obtain for visit to be over via telehealth. I verified that I am speaking with the correct person using two identifiers.  PCP:  Anabel Halon, MD   Chief Complaint: Fever and fatigue  History of Present Illness:    Sarah Eaton is a 36 y.o. female who has a televisit for c/o fever and fatigue for the last 5 days. She denies any nasal congestion, cough, sinus drainage, postnasal drip, sore throat.  She denies any dysuria, hematuria or urinary frequency.  Denies any nausea, vomiting or abdominal pain currently.  Due to inability of physical exam and no clear source of infection to be verified, advised to maintain urgent care visit so she can be evaluated better.   Disposition:  Follow up  Signed, Anabel Halon, MD  06/19/2022 4:41 PM     [facility]

## 2022-06-20 ENCOUNTER — Telehealth: Payer: 59 | Admitting: Internal Medicine

## 2022-06-27 ENCOUNTER — Other Ambulatory Visit: Payer: Self-pay | Admitting: Internal Medicine

## 2022-06-27 DIAGNOSIS — G43009 Migraine without aura, not intractable, without status migrainosus: Secondary | ICD-10-CM

## 2022-06-27 DIAGNOSIS — E669 Obesity, unspecified: Secondary | ICD-10-CM

## 2022-06-27 MED ORDER — SEMAGLUTIDE-WEIGHT MANAGEMENT 2.4 MG/0.75ML ~~LOC~~ SOAJ: 2.4000 mg | SUBCUTANEOUS | 0 refills | Status: DC

## 2022-06-27 MED ORDER — TOPIRAMATE 25 MG PO TABS: 25.0000 mg | ORAL_TABLET | Freq: Two times a day (BID) | ORAL | 0 refills | Status: DC

## 2022-07-01 ENCOUNTER — Encounter: Payer: Self-pay | Admitting: Internal Medicine

## 2022-07-02 ENCOUNTER — Ambulatory Visit (INDEPENDENT_AMBULATORY_CARE_PROVIDER_SITE_OTHER): Payer: 59 | Admitting: Internal Medicine

## 2022-07-02 ENCOUNTER — Encounter: Payer: Self-pay | Admitting: Internal Medicine

## 2022-07-02 VITALS — BP 132/84 | HR 98 | Resp 18 | Ht 62.0 in | Wt 145.4 lb

## 2022-07-02 DIAGNOSIS — K5904 Chronic idiopathic constipation: Secondary | ICD-10-CM | POA: Diagnosis not present

## 2022-07-02 DIAGNOSIS — F5101 Primary insomnia: Secondary | ICD-10-CM

## 2022-07-02 DIAGNOSIS — E663 Overweight: Secondary | ICD-10-CM | POA: Diagnosis not present

## 2022-07-02 DIAGNOSIS — G43009 Migraine without aura, not intractable, without status migrainosus: Secondary | ICD-10-CM | POA: Diagnosis not present

## 2022-07-02 MED ORDER — TRULANCE 3 MG PO TABS: 1.0000 | ORAL_TABLET | Freq: Every day | ORAL | 5 refills | Status: DC

## 2022-07-02 MED ORDER — HYDROXYZINE PAMOATE 25 MG PO CAPS: 25.0000 mg | ORAL_CAPSULE | Freq: Every day | ORAL | 2 refills | Status: DC

## 2022-07-02 NOTE — Patient Instructions (Signed)
Please stop taking Phentermine.  Please continue to follow low carb diet and perform moderate exercise/walking at least 150 mins/week.  Please start taking Trulance for constipation.

## 2022-07-03 ENCOUNTER — Other Ambulatory Visit: Payer: Self-pay | Admitting: Internal Medicine

## 2022-07-03 DIAGNOSIS — K5904 Chronic idiopathic constipation: Secondary | ICD-10-CM

## 2022-07-05 DIAGNOSIS — F5101 Primary insomnia: Secondary | ICD-10-CM | POA: Insufficient documentation

## 2022-07-05 NOTE — Progress Notes (Signed)
Established Patient Office Visit  Subjective:  Patient ID: Sarah Eaton, female    DOB: 1986-01-17  Age: 36 y.o. MRN: [id number]  CC:  Chief Complaint  Patient presents with   Follow-up    Patient would like something different for weight management has tried wegovy no longer covered by insurance and phentermine patient also is not sleeping good she stays up all the time     HPI Sarah Eaton is a 36 y.o. female with past medical history of migraine, obesity, Shingles and pyelonephritis who presents for f/u of her chronic medical conditions.  She has lost about 25 lbs with Wegovy.  She has been trying to follow low-carb diet and performs exercises regularly.  She has now stopped taking Wegovy due to cost concern as she was paying out-of-pocket for [facility].  She has started taking Adipex again.  She agrees to stop taking Adipex now as her BMI is less than 27 now.  Her constipation had improved with Linzess, but was later not covered by her insurance.  She also tried Amitiza, which helped somewhat, but had persistent constipation.  She denies any melena or hematochezia.  She also complains of insomnia.  She has difficulty maintaining sleep.  Denies any anhedonia, SI or HI currently.  She has tried taking melatonin without much relief.  She has had vaginal hysterectomy for history of menorrhagia.  She had recurrent vaginitis after it, which has resolved now.  Denies any fever, chills, vaginal discharge or bleeding currently.   Past Medical History:  Diagnosis Date   Anemia    Hypertension    Obesity    Ovarian cyst    Pyelonephritis 05/04/2021   Sepsis due to Escherichia coli (E. coli) ([place]) 05/04/2021    Past Surgical History:  Procedure Laterality Date   CESAREAN SECTION     CESAREAN SECTION WITH BILATERAL TUBAL LIGATION Bilateral 08/05/2013   Procedure: REPEAT CESAREAN SECTION WITH BILATERAL TUBAL LIGATION;  Surgeon: Emily Filbert, MD;  Location: [facility];  Service: Obstetrics;   Laterality: Bilateral;   ENDOMETRIAL ABLATION W/ NOVASURE  09/2014   LAPAROSCOPIC VAGINAL HYSTERECTOMY WITH SALPINGECTOMY N/A 04/01/2022   Procedure: LAPAROSCOPIC ASSISTED VAGINAL HYSTERECTOMY WITH SALPINGECTOMY;  Surgeon: Rubie Maid, MD;  Location: [facility];  Service: Gynecology;  Laterality: N/A;   TUBAL LIGATION      Family History  Problem Relation Age of Onset   Cancer Other        breast   Hypertension Mother    Cancer Mother        skin   Thyroid disease Mother    Cancer Maternal Grandmother    Hypertension Maternal Grandmother    Thyroid disease Father    Thyroid disease Paternal Grandmother     Social History   Socioeconomic History   Marital status: Legally Separated    Spouse name: Not on file   Number of children: Not on file   Years of education: Not on file   Highest education level: Not on file  Occupational History   Not on file  Tobacco Use   Smoking status: Never   Smokeless tobacco: Never  Vaping Use   Vaping Use: Never used  Substance and Sexual Activity   Alcohol use: Not Currently    Comment: occasionally   Drug use: Never   Sexual activity: Yes    Birth control/protection: Surgical  Other Topics Concern   Not on file  Social History Narrative   Live at home with children.  Social Determinants of Health   Financial Resource Strain: Not on file  Food Insecurity: Not on file  Transportation Needs: Not on file  Physical Activity: Not on file  Stress: Not on file  Social Connections: Not on file  Intimate Partner Violence: Not on file    Outpatient Medications Prior to Visit  Medication Sig Dispense Refill   ibuprofen (ADVIL) 600 MG tablet Take 1 tablet (600 mg total) by mouth every 6 (six) hours as needed. 60 tablet 1   ondansetron (ZOFRAN-ODT) 4 MG disintegrating tablet Take 1 tablet (4 mg total) by mouth every 8 (eight) hours as needed for nausea or vomiting. 15 tablet 0   rizatriptan (MAXALT) 10 MG tablet TAKE 1 TABLET BY MOUTH  AS NEEDED FOR MIGRAINE. MAY REPEAT IN 2 HOURS IF NEEDED 4 tablet 2   topiramate (TOPAMAX) 25 MG tablet Take 1 tablet (25 mg total) by mouth 2 (two) times daily. 180 tablet 0   phentermine 37.5 MG capsule Take 1 capsule (37.5 mg total) by mouth every morning. 30 capsule 2   Semaglutide-Weight Management 2.4 MG/0.75ML SOAJ Inject 2.4 mg into the skin once a week for 28 days. (Patient not taking: Reported on 07/02/2022) 3 mL 0   No facility-administered medications prior to visit.    Allergies  Allergen Reactions   Banana Anaphylaxis   Geralyn Flash [Fish Allergy] Anaphylaxis    Tuna fish only per pt    ROS Review of Systems  Constitutional:  Negative for chills and fever.  HENT:  Negative for congestion, sinus pressure, sinus pain and sore throat.   Eyes:  Negative for pain and discharge.  Respiratory:  Negative for cough and shortness of breath.   Cardiovascular:  Negative for chest pain and palpitations.  Gastrointestinal:  Positive for constipation. Negative for diarrhea, nausea and vomiting.  Endocrine: Negative for polydipsia and polyuria.  Genitourinary:  Negative for dysuria and hematuria.  Musculoskeletal:  Negative for neck pain and neck stiffness.  Skin:  Negative for rash.  Neurological:  Positive for headaches. Negative for dizziness and weakness.  Psychiatric/Behavioral:  Negative for agitation and behavioral problems. The patient is nervous/anxious.       Objective:    Physical Exam Vitals reviewed.  Constitutional:      General: She is not in acute distress.    Appearance: She is not diaphoretic.  HENT:     Head: Normocephalic and atraumatic.     Nose: Nose normal.     Mouth/Throat:     Mouth: Mucous membranes are moist.  Eyes:     General: No scleral icterus.    Extraocular Movements: Extraocular movements intact.  Cardiovascular:     Rate and Rhythm: Normal rate and regular rhythm.     Pulses: Normal pulses.     Heart sounds: Normal heart sounds. No murmur  heard. Pulmonary:     Breath sounds: Normal breath sounds. No wheezing or rales.  Musculoskeletal:     Cervical back: Neck supple. No tenderness.     Right lower leg: No edema.     Left lower leg: No edema.  Skin:    General: Skin is warm.     Findings: No rash.  Neurological:     General: No focal deficit present.     Mental Status: She is alert and oriented to person, place, and time.  Psychiatric:        Mood and Affect: Mood normal.        Behavior: Behavior normal.  BP 132/84 (BP Location: Right Arm, Patient Position: Sitting, Cuff Size: Normal)   Pulse 98   Resp 18   Ht _0  (1.575 m)   Wt 145 lb 6.4 oz (66 kg)   LMP 02/16/2022   SpO2 99%   BMI 26.59 kg/m  Wt Readings from Last 3 Encounters:  07/02/22 145 lb 6.4 oz (66 kg)  06/19/22 149 lb 11.1 oz (67.9 kg)  05/16/22 149 lb 11.2 oz (67.9 kg)    Lab Results  Component Value Date   TSH 1.020 07/25/2021   Lab Results  Component Value Date   WBC 6.6 06/19/2022   HGB 13.3 06/19/2022   HCT 39.3 06/19/2022   MCV 90.8 06/19/2022   PLT 277 06/19/2022   Lab Results  Component Value Date   NA 136 06/19/2022   K 3.9 06/19/2022   CO2 23 06/19/2022   GLUCOSE 89 06/19/2022   BUN 14 06/19/2022   CREATININE 0.89 06/19/2022   BILITOT 0.3 06/19/2022   ALKPHOS 48 06/19/2022   AST 16 06/19/2022   ALT 19 06/19/2022   PROT 7.2 06/19/2022   ALBUMIN 4.0 06/19/2022   CALCIUM 9.0 06/19/2022   ANIONGAP 6 06/19/2022   EGFR 89 07/25/2021   Lab Results  Component Value Date   CHOL 184 07/25/2021   Lab Results  Component Value Date   HDL 59 07/25/2021   Lab Results  Component Value Date   LDLCALC 112 (H) 07/25/2021   Lab Results  Component Value Date   TRIG 67 07/25/2021   Lab Results  Component Value Date   CHOLHDL 3.1 07/25/2021   Lab Results  Component Value Date   HGBA1C 4.9 07/01/2019      Assessment & Plan:   Problem List Items Addressed This Visit       Cardiovascular and Mediastinum    Migraine - Primary    On Topiramate, better now Maxalt PRN for migraine flare up        Digestive   Chronic idiopathic constipation    Has tried Colace, senna, MiraLAX and Dulcolax in the past Had responded well to [place], but not covered by her insurance now Had slightly improved with Amitiza, but still uncontrolled Switched to [employer] Will refer to GI if persistent concern      Relevant Medications   Plecanatide (TRULANCE) 3 MG TABS     Other   Overweight    Body mass index is 26.59 kg/m.  She has been following low carb diet and portion control Exercises regularly BMI 31.17 when Wegovy started, has lost 25 lbs with it, could not continue due to cost concern DC Adipex as her BMI < 27 now      Primary insomnia    Could be due to anxiety Phentermine can also cause agitation/over activity DC phentermine Avoid caffeinated products in the evening Sleep hygiene material provided Started Vistaril as needed      Relevant Medications   hydrOXYzine (VISTARIL) 25 MG capsule    Meds ordered this encounter  Medications   Plecanatide (TRULANCE) 3 MG TABS    Sig: Take 1 tablet by mouth daily.    Dispense:  30 tablet    Refill:  5   hydrOXYzine (VISTARIL) 25 MG capsule    Sig: Take 1 capsule (25 mg total) by mouth at bedtime.    Dispense:  30 capsule    Refill:  2    Follow-up: Return in about 6 months (around 01/02/2023) for Constipation.    Bellamia Ferch  Keith Rake, MD

## 2022-07-05 NOTE — Assessment & Plan Note (Signed)
On Topiramate, better now Maxalt PRN for migraine flare up 

## 2022-07-05 NOTE — Assessment & Plan Note (Addendum)
Could be due to anxiety Phentermine can also cause agitation/over activity DC phentermine Avoid caffeinated products in the evening Sleep hygiene material provided Started Vistaril as needed

## 2022-07-05 NOTE — Assessment & Plan Note (Signed)
Body mass index is 26.59 kg/m.  She has been following low carb diet and portion control Exercises regularly BMI 31.17 when Wegovy started, has lost 25 lbs with it, could not continue due to cost concern DC Adipex as her BMI < 27 now

## 2022-07-05 NOTE — Assessment & Plan Note (Signed)
Has tried Colace, senna, MiraLAX and Dulcolax in the past Had responded well to Linzess, but not covered by her insurance now Had slightly improved with Amitiza, but still uncontrolled Switched to [employer] Will refer to GI if persistent concern

## 2022-07-17 ENCOUNTER — Other Ambulatory Visit: Payer: Self-pay | Admitting: Internal Medicine

## 2022-07-17 ENCOUNTER — Encounter: Payer: Self-pay | Admitting: Internal Medicine

## 2022-07-17 DIAGNOSIS — E663 Overweight: Secondary | ICD-10-CM

## 2022-07-17 DIAGNOSIS — K5904 Chronic idiopathic constipation: Secondary | ICD-10-CM

## 2022-07-17 MED ORDER — CONTRAVE 8-90 MG PO TB12: ORAL_TABLET | ORAL | 0 refills | Status: DC

## 2022-07-25 ENCOUNTER — Other Ambulatory Visit: Payer: Self-pay | Admitting: Internal Medicine

## 2022-07-25 DIAGNOSIS — F5101 Primary insomnia: Secondary | ICD-10-CM

## 2022-08-13 ENCOUNTER — Encounter: Payer: Self-pay | Admitting: Internal Medicine

## 2022-08-14 ENCOUNTER — Other Ambulatory Visit: Payer: Self-pay | Admitting: Internal Medicine

## 2022-08-14 DIAGNOSIS — E663 Overweight: Secondary | ICD-10-CM

## 2022-08-20 ENCOUNTER — Other Ambulatory Visit: Payer: Self-pay | Admitting: Internal Medicine

## 2022-08-20 DIAGNOSIS — E669 Obesity, unspecified: Secondary | ICD-10-CM

## 2022-08-20 MED ORDER — QSYMIA 3.75-23 MG PO CP24: 1.0000 | ORAL_CAPSULE | Freq: Every day | ORAL | 0 refills | Status: DC

## 2022-09-06 ENCOUNTER — Other Ambulatory Visit: Payer: Self-pay | Admitting: Internal Medicine

## 2022-09-06 DIAGNOSIS — E663 Overweight: Secondary | ICD-10-CM

## 2022-09-06 MED ORDER — QSYMIA 7.5-46 MG PO CP24: 1.0000 | ORAL_CAPSULE | Freq: Every day | ORAL | 1 refills | Status: DC

## 2022-09-19 ENCOUNTER — Other Ambulatory Visit: Payer: Self-pay | Admitting: Internal Medicine

## 2022-09-19 ENCOUNTER — Encounter: Payer: Self-pay | Admitting: Obstetrics and Gynecology

## 2022-09-19 DIAGNOSIS — B009 Herpesviral infection, unspecified: Secondary | ICD-10-CM

## 2022-09-19 DIAGNOSIS — E669 Obesity, unspecified: Secondary | ICD-10-CM

## 2022-09-19 MED ORDER — VALACYCLOVIR HCL 1 G PO TABS: 1000.0000 mg | ORAL_TABLET | Freq: Two times a day (BID) | ORAL | 3 refills | Status: DC

## 2022-09-19 MED ORDER — PHENTERMINE HCL 15 MG PO CAPS: 15.0000 mg | ORAL_CAPSULE | ORAL | 0 refills | Status: DC

## 2022-09-23 NOTE — Telephone Encounter (Signed)
Will hold for PA  

## 2022-09-26 ENCOUNTER — Other Ambulatory Visit: Payer: Self-pay | Admitting: Internal Medicine

## 2022-09-26 DIAGNOSIS — G43009 Migraine without aura, not intractable, without status migrainosus: Secondary | ICD-10-CM

## 2022-09-27 ENCOUNTER — Telehealth: Payer: Self-pay | Admitting: Internal Medicine

## 2022-09-27 ENCOUNTER — Other Ambulatory Visit: Payer: Self-pay | Admitting: Internal Medicine

## 2022-09-27 DIAGNOSIS — E669 Obesity, unspecified: Secondary | ICD-10-CM

## 2022-09-27 MED ORDER — PHENTERMINE HCL 15 MG PO CAPS
15.0000 mg | ORAL_CAPSULE | ORAL | 0 refills | Status: DC
Start: 2022-09-27 — End: 2022-11-20

## 2022-09-27 NOTE — Telephone Encounter (Signed)
Patient returning nurse call. Had patient on hold, patient hung up. [phone].

## 2022-11-13 ENCOUNTER — Encounter: Payer: Self-pay | Admitting: Internal Medicine

## 2022-11-20 ENCOUNTER — Ambulatory Visit (INDEPENDENT_AMBULATORY_CARE_PROVIDER_SITE_OTHER): Payer: 59 | Admitting: Internal Medicine

## 2022-11-20 ENCOUNTER — Other Ambulatory Visit: Payer: Self-pay | Admitting: Internal Medicine

## 2022-11-20 ENCOUNTER — Encounter: Payer: Self-pay | Admitting: Internal Medicine

## 2022-11-20 VITALS — BP 133/87 | HR 86 | Ht 62.0 in | Wt 178.8 lb

## 2022-11-20 DIAGNOSIS — Z23 Encounter for immunization: Secondary | ICD-10-CM | POA: Diagnosis not present

## 2022-11-20 DIAGNOSIS — E88819 Insulin resistance, unspecified: Secondary | ICD-10-CM

## 2022-11-20 DIAGNOSIS — K5904 Chronic idiopathic constipation: Secondary | ICD-10-CM

## 2022-11-20 DIAGNOSIS — F4323 Adjustment disorder with mixed anxiety and depressed mood: Secondary | ICD-10-CM

## 2022-11-20 DIAGNOSIS — E669 Obesity, unspecified: Secondary | ICD-10-CM

## 2022-11-20 DIAGNOSIS — G43009 Migraine without aura, not intractable, without status migrainosus: Secondary | ICD-10-CM

## 2022-11-20 DIAGNOSIS — Z6832 Body mass index (BMI) 32.0-32.9, adult: Secondary | ICD-10-CM

## 2022-11-20 MED ORDER — PHENTERMINE HCL 37.5 MG PO CAPS: 37.5000 mg | ORAL_CAPSULE | ORAL | 3 refills | Status: DC

## 2022-11-20 MED ORDER — TIRZEPATIDE 2.5 MG/0.5ML ~~LOC~~ SOAJ: 2.5000 mg | SUBCUTANEOUS | 0 refills | Status: DC

## 2022-11-20 NOTE — Patient Instructions (Signed)
Please start taking Phentermine as prescribed.  Once you get Mounjaro, please stop Phentermine.  Please continue to follow low carb diet and ambulate as tolerated.

## 2022-11-20 NOTE — Assessment & Plan Note (Signed)
Likely due to stress from the divorce procedure May be a cause for weight gain, although she reports following low carb diet Can try relaxation techniques If persistent, can start SSRI

## 2022-11-20 NOTE — Assessment & Plan Note (Signed)
Has recent weight gain and history of prediabetes Had tolerated Wegovy well, but was not covered by insurance Will start [facility] for prediabetes/insulin resistance and to help with weight loss

## 2022-11-20 NOTE — Progress Notes (Signed)
Established Patient Office Visit  Subjective:  Patient ID: Sarah Eaton, female    DOB: 04-28-1986  Age: 37 y.o. MRN: [id number]  CC:  Chief Complaint  Patient presents with   Weight Loss    HPI Sarah Eaton is a 37 y.o. female with past medical history of migraine, obesity, Shingles and pyelonephritis who presents for f/u of her chronic medical conditions.  Obesity: She has gained about 33 lbs since the last visit in 08/23.  She has tried [place], but was very expensive she had to pay out-of-pocket.  She has tried Herbalist and Qsymia without much benefit.  She is following very specific low-carb diet from our nutritionist service, and gets prepackaged food in it.  Of note, she admits that she has been dealing with stress due to ongoing divorce procedure.  She also had hysterectomy in 2023, which could have led to some weight gain.  She also reports hair loss recently.  Denies any recent skin or nail changes.    Past Medical History:  Diagnosis Date   Anemia    Hypertension    Obesity    Ovarian cyst    Pyelonephritis 05/04/2021   Sepsis due to Escherichia coli (E. coli) ([place]) 05/04/2021    Past Surgical History:  Procedure Laterality Date   CESAREAN SECTION     CESAREAN SECTION WITH BILATERAL TUBAL LIGATION Bilateral 08/05/2013   Procedure: REPEAT CESAREAN SECTION WITH BILATERAL TUBAL LIGATION;  Surgeon: Emily Filbert, MD;  Location: [facility];  Service: Obstetrics;  Laterality: Bilateral;   ENDOMETRIAL ABLATION W/ NOVASURE  09/2014   LAPAROSCOPIC VAGINAL HYSTERECTOMY WITH SALPINGECTOMY N/A 04/01/2022   Procedure: LAPAROSCOPIC ASSISTED VAGINAL HYSTERECTOMY WITH SALPINGECTOMY;  Surgeon: Rubie Maid, MD;  Location: [facility];  Service: Gynecology;  Laterality: N/A;   TUBAL LIGATION      Family History  Problem Relation Age of Onset   Cancer Other        breast   Hypertension Mother    Cancer Mother        skin   Thyroid disease Mother    Cancer Maternal Grandmother     Hypertension Maternal Grandmother    Thyroid disease Father    Thyroid disease Paternal Grandmother     Social History   Socioeconomic History   Marital status: Legally Separated    Spouse name: Not on file   Number of children: Not on file   Years of education: Not on file   Highest education level: Not on file  Occupational History   Not on file  Tobacco Use   Smoking status: Never   Smokeless tobacco: Never  Vaping Use   Vaping Use: Never used  Substance and Sexual Activity   Alcohol use: Not Currently    Comment: occasionally   Drug use: Never   Sexual activity: Yes    Birth control/protection: Surgical  Other Topics Concern   Not on file  Social History Narrative   Live at home with children.     Social Determinants of Health   Financial Resource Strain: Not on file  Food Insecurity: Not on file  Transportation Needs: Not on file  Physical Activity: Not on file  Stress: Not on file  Social Connections: Not on file  Intimate Partner Violence: Not on file    Outpatient Medications Prior to Visit  Medication Sig Dispense Refill   hydrOXYzine (VISTARIL) 25 MG capsule TAKE 1 CAPSULE BY MOUTH AT BEDTIME. 90 capsule 1   ibuprofen (ADVIL) 600  MG tablet Take 1 tablet (600 mg total) by mouth every 6 (six) hours as needed. 60 tablet 1   ondansetron (ZOFRAN-ODT) 4 MG disintegrating tablet Take 1 tablet (4 mg total) by mouth every 8 (eight) hours as needed for nausea or vomiting. 15 tablet 0   rizatriptan (MAXALT) 10 MG tablet TAKE 1 TABLET BY MOUTH AS NEEDED FOR MIGRAINE. MAY REPEAT IN 2 HOURS IF NEEDED 4 tablet 2   topiramate (TOPAMAX) 25 MG tablet TAKE 1 TABLET BY MOUTH TWICE A DAY 180 tablet 0   TRULANCE 3 MG TABS TAKE 1 TABLET BY MOUTH EVERY DAY 30 tablet 5   valACYclovir (VALTREX) 1000 MG tablet Take 1 tablet (1,000 mg total) by mouth 2 (two) times daily. Take for ten days. 20 tablet 3   phentermine 15 MG capsule Take 1 capsule (15 mg total) by mouth every morning. 30  capsule 0   Phentermine-Topiramate (QSYMIA) 7.5-46 MG CP24 Take 1 capsule by mouth daily. 30 capsule 1   No facility-administered medications prior to visit.    Allergies  Allergen Reactions   Banana Anaphylaxis   Geralyn Flash [Fish Allergy] Anaphylaxis    Tuna fish only per pt    ROS Review of Systems  Constitutional:  Negative for chills and fever.  HENT:  Negative for congestion, sinus pressure, sinus pain and sore throat.   Eyes:  Negative for pain and discharge.  Respiratory:  Negative for cough and shortness of breath.   Cardiovascular:  Negative for chest pain and palpitations.  Gastrointestinal:  Positive for constipation. Negative for diarrhea, nausea and vomiting.  Endocrine: Negative for polydipsia and polyuria.  Genitourinary:  Negative for dysuria and hematuria.  Musculoskeletal:  Negative for neck pain and neck stiffness.  Skin:  Negative for rash.  Neurological:  Positive for headaches. Negative for dizziness and weakness.  Psychiatric/Behavioral:  Negative for agitation and behavioral problems. The patient is nervous/anxious.       Objective:    Physical Exam Vitals reviewed.  Constitutional:      General: She is not in acute distress.    Appearance: She is not diaphoretic.  HENT:     Head: Normocephalic and atraumatic.     Nose: Nose normal.     Mouth/Throat:     Mouth: Mucous membranes are moist.  Eyes:     General: No scleral icterus.    Extraocular Movements: Extraocular movements intact.  Cardiovascular:     Rate and Rhythm: Normal rate and regular rhythm.     Pulses: Normal pulses.     Heart sounds: Normal heart sounds. No murmur heard. Pulmonary:     Breath sounds: Normal breath sounds. No wheezing or rales.  Musculoskeletal:     Cervical back: Neck supple. No tenderness.     Right lower leg: No edema.     Left lower leg: No edema.  Skin:    General: Skin is warm.     Findings: No rash.  Neurological:     General: No focal deficit present.      Mental Status: She is alert and oriented to person, place, and time.  Psychiatric:        Mood and Affect: Mood is anxious.        Behavior: Behavior normal.     BP 133/87 (BP Location: Right Arm, Patient Position: Sitting, Cuff Size: Normal)   Pulse 86   Ht _0  (1.575 m)   Wt 178 lb 12.8 oz (81.1 kg)   LMP 02/16/2022  SpO2 99%   BMI 32.70 kg/m  Wt Readings from Last 3 Encounters:  11/20/22 178 lb 12.8 oz (81.1 kg)  07/02/22 145 lb 6.4 oz (66 kg)  06/19/22 149 lb 11.1 oz (67.9 kg)    Lab Results  Component Value Date   TSH 1.020 07/25/2021   Lab Results  Component Value Date   WBC 6.6 06/19/2022   HGB 13.3 06/19/2022   HCT 39.3 06/19/2022   MCV 90.8 06/19/2022   PLT 277 06/19/2022   Lab Results  Component Value Date   NA 136 06/19/2022   K 3.9 06/19/2022   CO2 23 06/19/2022   GLUCOSE 89 06/19/2022   BUN 14 06/19/2022   CREATININE 0.89 06/19/2022   BILITOT 0.3 06/19/2022   ALKPHOS 48 06/19/2022   AST 16 06/19/2022   ALT 19 06/19/2022   PROT 7.2 06/19/2022   ALBUMIN 4.0 06/19/2022   CALCIUM 9.0 06/19/2022   ANIONGAP 6 06/19/2022   EGFR 89 07/25/2021   Lab Results  Component Value Date   CHOL 184 07/25/2021   Lab Results  Component Value Date   HDL 59 07/25/2021   Lab Results  Component Value Date   LDLCALC 112 (H) 07/25/2021   Lab Results  Component Value Date   TRIG 67 07/25/2021   Lab Results  Component Value Date   CHOLHDL 3.1 07/25/2021   Lab Results  Component Value Date   HGBA1C 4.9 07/01/2019      Assessment & Plan:   Problem List Items Addressed This Visit       Cardiovascular and Mediastinum   Migraine    On Topiramate, better now Maxalt PRN for migraine flare up        Digestive   Chronic idiopathic constipation    Has tried Colace, senna, MiraLAX and Dulcolax in the past Had responded well to [place], but not covered by her insurance now Had slightly improved with Amitiza, but still uncontrolled Switched to  [employer], but was not covered Will refer to GI if persistent concern        Endocrine   Insulin resistance    Has recent weight gain and history of prediabetes Had tolerated Wegovy well, but was not covered by insurance Will start Mounjaro for prediabetes/insulin resistance and to help with weight loss      Relevant Medications   tirzepatide (MOUNJARO) 2.5 MG/0.5ML Pen   Other Relevant Orders   Hemoglobin A1c     Other   Obesity - Primary    BMI Readings from Last 3 Encounters:  11/20/22 32.70 kg/m  07/02/22 26.59 kg/m  06/19/22 27.38 kg/m  She has been following low carb diet and portion control Exercises regularly BMI 31.17 when Wegovy started, has lost 25 lbs with it, could not continue due to cost concern Has tried Qsymia and Contrave without much benefit Started phentermine 37.5 mg QD again Will try to start Mounjaro for insulin resistance      Relevant Medications   phentermine 37.5 MG capsule   tirzepatide (MOUNJARO) 2.5 MG/0.5ML Pen   Other Relevant Orders   TSH + free T4   CMP14+EGFR   Adjustment disorder with mixed anxiety and depressed mood    Likely due to stress from the divorce procedure May be a cause for weight gain, although she reports following low carb diet Can try relaxation techniques If persistent, can start SSRI      Other Visit Diagnoses     Need for immunization against influenza  Relevant Orders   Flu Vaccine QUAD 76moIM (Fluarix, Fluzone & Alfiuria Quad PF) (Completed)       Meds ordered this encounter  Medications   phentermine 37.5 MG capsule    Sig: Take 1 capsule (37.5 mg total) by mouth every morning.    Dispense:  30 capsule    Refill:  3   tirzepatide (MOUNJARO) 2.5 MG/0.5ML Pen    Sig: Inject 2.5 mg into the skin once a week.    Dispense:  2 mL    Refill:  0    Follow-up: Return in about 3 months (around 02/19/2023).    RLindell Spar MD

## 2022-11-20 NOTE — Assessment & Plan Note (Signed)
Has tried Colace, senna, MiraLAX and Dulcolax in the past Had responded well to [place], but not covered by her insurance now Had slightly improved with Amitiza, but still uncontrolled Switched to [employer], but was not covered Will refer to GI if persistent concern

## 2022-11-20 NOTE — Assessment & Plan Note (Addendum)
BMI Readings from Last 3 Encounters:  11/20/22 32.70 kg/m  07/02/22 26.59 kg/m  06/19/22 27.38 kg/m   She has been following low carb diet and portion control Exercises regularly BMI 31.17 when Wegovy started, has lost 25 lbs with it, could not continue due to cost concern Has tried Qsymia and Contrave without much benefit Started phentermine 37.5 mg QD again Will try to start Mounjaro for insulin resistance

## 2022-11-20 NOTE — Addendum Note (Signed)
Addended byIhor Dow on: 11/20/2022 05:17 PM   Modules accepted: Orders

## 2022-11-20 NOTE — Assessment & Plan Note (Signed)
On Topiramate, better now Maxalt PRN for migraine flare up

## 2022-11-21 ENCOUNTER — Other Ambulatory Visit: Payer: Self-pay | Admitting: Internal Medicine

## 2022-11-21 DIAGNOSIS — E88819 Insulin resistance, unspecified: Secondary | ICD-10-CM

## 2022-11-21 LAB — CMP14+EGFR
ALT: 18 IU/L (ref 0–32)
AST: 17 IU/L (ref 0–40)
Albumin/Globulin Ratio: 1.7 (ref 1.2–2.2)
Albumin: 4.7 g/dL (ref 3.9–4.9)
Alkaline Phosphatase: 59 IU/L (ref 44–121)
BUN/Creatinine Ratio: 14 (ref 9–23)
BUN: 13 mg/dL (ref 6–20)
Bilirubin Total: <0.2 mg/dL (ref 0.0–1.2)
CO2: 20 mmol/L (ref 20–29)
Calcium: 9.4 mg/dL (ref 8.7–10.2)
Chloride: 103 mmol/L (ref 96–106)
Creatinine, Ser: 0.93 mg/dL (ref 0.57–1.00)
Globulin, Total: 2.7 g/dL (ref 1.5–4.5)
Glucose: 85 mg/dL (ref 70–99)
Potassium: 4.4 mmol/L (ref 3.5–5.2)
Sodium: 140 mmol/L (ref 134–144)
Total Protein: 7.4 g/dL (ref 6.0–8.5)
eGFR: 83 mL/min/{1.73_m2} (ref >59)

## 2022-11-21 LAB — TSH+FREE T4
Free T4: 0.98 ng/dL (ref 0.82–1.77)
TSH: 2.49 u[IU]/mL (ref 0.450–4.500)

## 2022-11-21 LAB — HEMOGLOBIN A1C
Est. average glucose Bld gHb Est-mCnc: 100 mg/dL
Hgb A1c MFr Bld: 5.1 % (ref 4.8–5.6)

## 2022-11-26 ENCOUNTER — Encounter: Payer: Self-pay | Admitting: Internal Medicine

## 2022-11-26 ENCOUNTER — Telehealth: Payer: Self-pay | Admitting: Internal Medicine

## 2022-11-26 NOTE — Telephone Encounter (Signed)
Spoke to pharmacist mark

## 2022-11-26 NOTE — Telephone Encounter (Signed)
Sarah Eaton, [place], [phone]   Called wanting to verbally give PA info to nurse since pt stated to phar we have not received this??  Can you please contact Sarah Eaton when available?

## 2022-11-28 ENCOUNTER — Other Ambulatory Visit: Payer: Self-pay | Admitting: Internal Medicine

## 2022-11-28 DIAGNOSIS — E88819 Insulin resistance, unspecified: Secondary | ICD-10-CM

## 2022-12-09 ENCOUNTER — Other Ambulatory Visit: Payer: Self-pay | Admitting: Internal Medicine

## 2022-12-09 DIAGNOSIS — E88819 Insulin resistance, unspecified: Secondary | ICD-10-CM

## 2022-12-28 ENCOUNTER — Other Ambulatory Visit: Payer: Self-pay | Admitting: Internal Medicine

## 2022-12-28 DIAGNOSIS — G43009 Migraine without aura, not intractable, without status migrainosus: Secondary | ICD-10-CM

## 2023-01-03 ENCOUNTER — Ambulatory Visit: Payer: 59 | Admitting: Internal Medicine

## 2023-02-20 ENCOUNTER — Other Ambulatory Visit: Payer: Self-pay | Admitting: Internal Medicine

## 2023-02-20 DIAGNOSIS — G43009 Migraine without aura, not intractable, without status migrainosus: Secondary | ICD-10-CM

## 2023-02-25 ENCOUNTER — Encounter: Payer: Self-pay | Admitting: Internal Medicine

## 2023-03-23 ENCOUNTER — Other Ambulatory Visit: Payer: Self-pay | Admitting: Internal Medicine

## 2023-03-23 DIAGNOSIS — G43009 Migraine without aura, not intractable, without status migrainosus: Secondary | ICD-10-CM

## 2023-03-31 ENCOUNTER — Encounter: Payer: Self-pay | Admitting: Internal Medicine

## 2023-04-07 ENCOUNTER — Encounter: Payer: Self-pay | Admitting: Urology

## 2023-04-08 ENCOUNTER — Ambulatory Visit (INDEPENDENT_AMBULATORY_CARE_PROVIDER_SITE_OTHER): Payer: 59

## 2023-04-08 DIAGNOSIS — R109 Unspecified abdominal pain: Secondary | ICD-10-CM | POA: Diagnosis not present

## 2023-04-08 LAB — URINALYSIS, ROUTINE W REFLEX MICROSCOPIC
Bilirubin, UA: NEGATIVE
Glucose, UA: NEGATIVE
Ketones, UA: NEGATIVE
Leukocytes,UA: NEGATIVE
Nitrite, UA: NEGATIVE
Protein,UA: NEGATIVE
RBC, UA: NEGATIVE
Specific Gravity, UA: <1.005 — ABNORMAL LOW (ref 1.005–1.030)
Urobilinogen, Ur: 0.2 mg/dL (ref 0.2–1.0)
pH, UA: 6 (ref 5.0–7.5)

## 2023-04-08 NOTE — Progress Notes (Signed)
Patient presents today with complaints of flank pain.  UA done today.  Dr. Retta Diones reviewed results and states ua is clear no need for treatment at this time .  Patient aware of MD recommendations and to follow up with PCP per Dr. Estil Daft recommendation via [place].    Guss Bunde, CMA

## 2023-04-30 ENCOUNTER — Other Ambulatory Visit: Payer: Self-pay | Admitting: Internal Medicine

## 2023-04-30 DIAGNOSIS — F5101 Primary insomnia: Secondary | ICD-10-CM

## 2023-05-01 ENCOUNTER — Other Ambulatory Visit: Payer: Self-pay | Admitting: Internal Medicine

## 2023-05-01 ENCOUNTER — Encounter: Payer: Self-pay | Admitting: Internal Medicine

## 2023-05-01 DIAGNOSIS — K5904 Chronic idiopathic constipation: Secondary | ICD-10-CM

## 2023-05-01 MED ORDER — LINACLOTIDE 145 MCG PO CAPS: 145.0000 ug | ORAL_CAPSULE | Freq: Every day | ORAL | 5 refills | Status: DC

## 2023-05-08 NOTE — Telephone Encounter (Signed)
Lvm and sent mychart message to contact our office to schedule in office visit.

## 2023-05-25 ENCOUNTER — Encounter: Payer: Self-pay | Admitting: Obstetrics and Gynecology

## 2023-05-26 MED ORDER — IMVEXXY MAINTENANCE PACK 10 MCG VA INST: 1.0000 | VAGINAL_INSERT | VAGINAL | 11 refills | Status: DC

## 2023-05-26 MED ORDER — IMVEXXY STARTER PACK 10 MCG VA INST: 1.0000 | VAGINAL_INSERT | Freq: Every day | VAGINAL | 0 refills | Status: DC

## 2023-06-06 ENCOUNTER — Other Ambulatory Visit: Payer: Self-pay | Admitting: Internal Medicine

## 2023-06-06 DIAGNOSIS — G43009 Migraine without aura, not intractable, without status migrainosus: Secondary | ICD-10-CM

## 2023-06-12 ENCOUNTER — Other Ambulatory Visit: Payer: Self-pay | Admitting: Internal Medicine

## 2023-06-12 DIAGNOSIS — E669 Obesity, unspecified: Secondary | ICD-10-CM

## 2023-08-06 IMAGING — US US RENAL
1 series · 14 of 25 positions shown · non-contrast
Comparison: 06/25/2021

CLINICAL DATA: Flank pain, history of pyelonephritis

EXAM:
RENAL / URINARY TRACT ULTRASOUND COMPLETE

[Series 1: us renal · 14 of 30 slices shown]
[im 1/30]
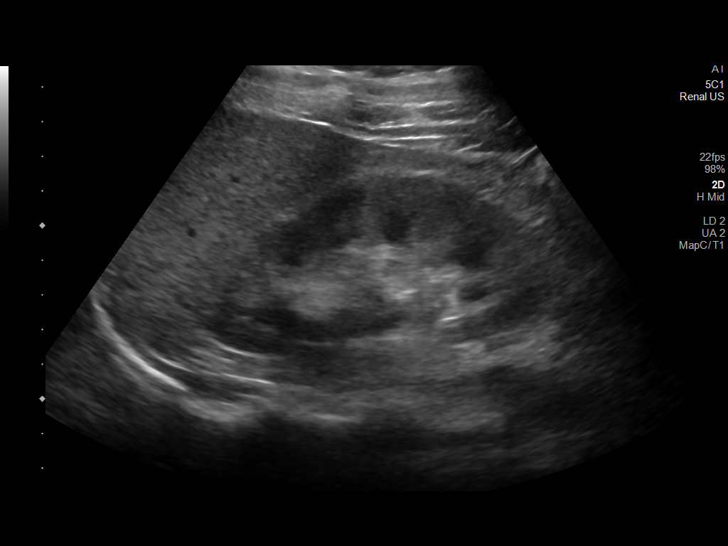
[im 3/30]
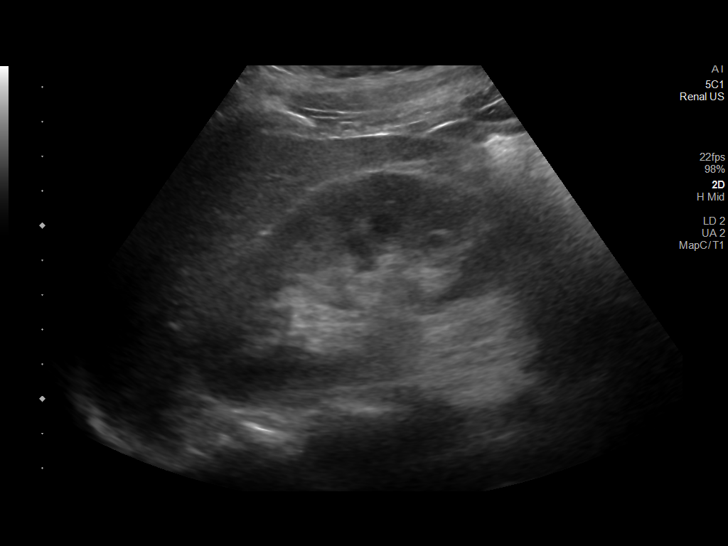
[im 5/30]
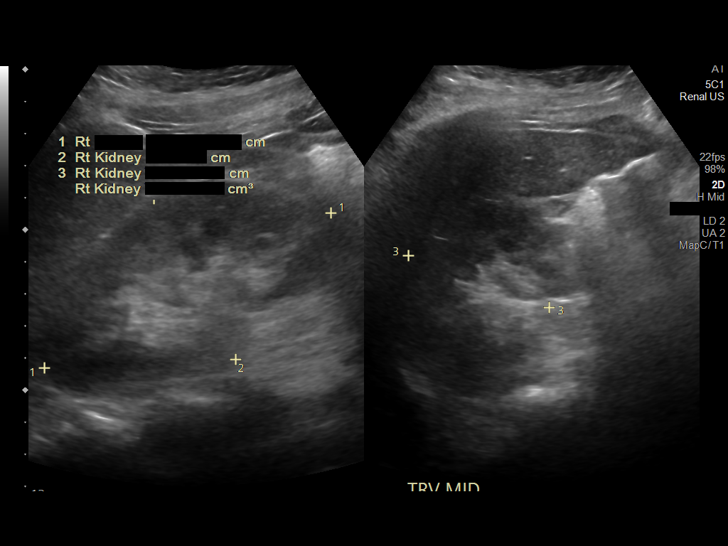
[im 8/30]
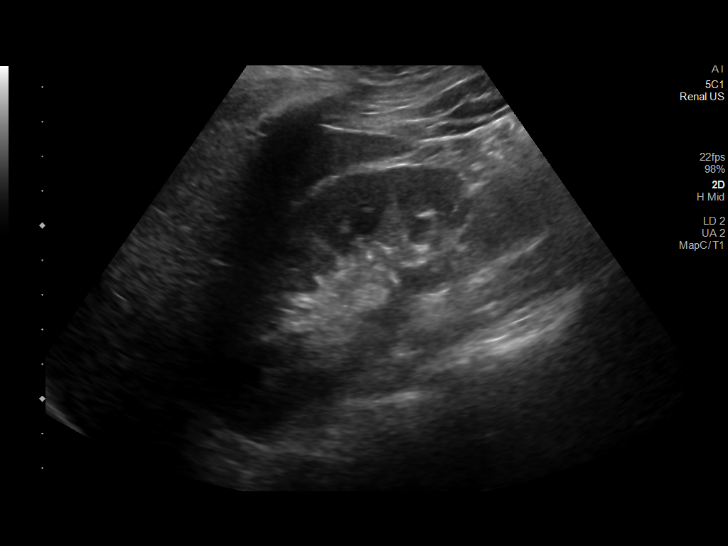
[im 10/30]
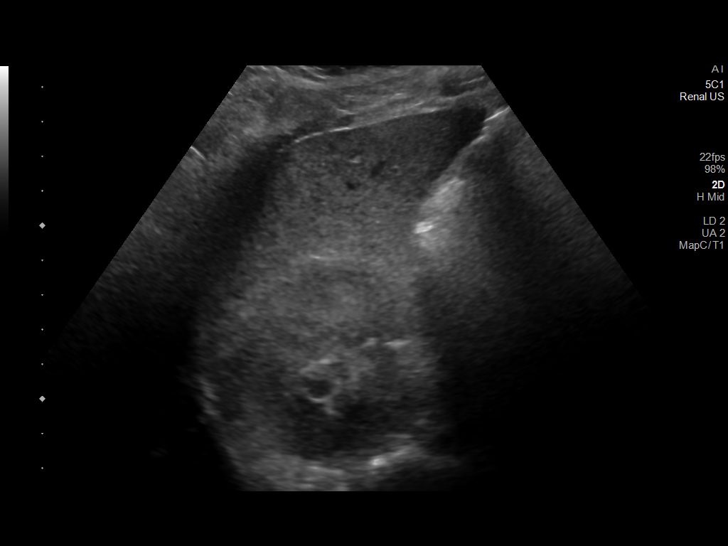
[im 11/30]
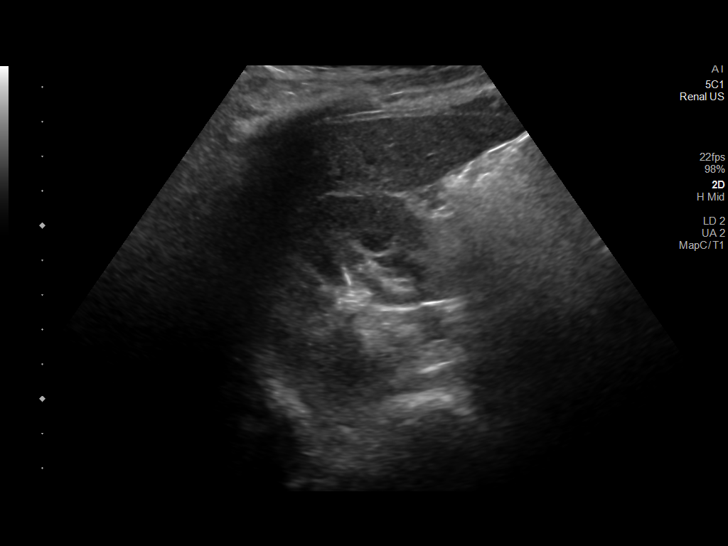
[im 14/30]
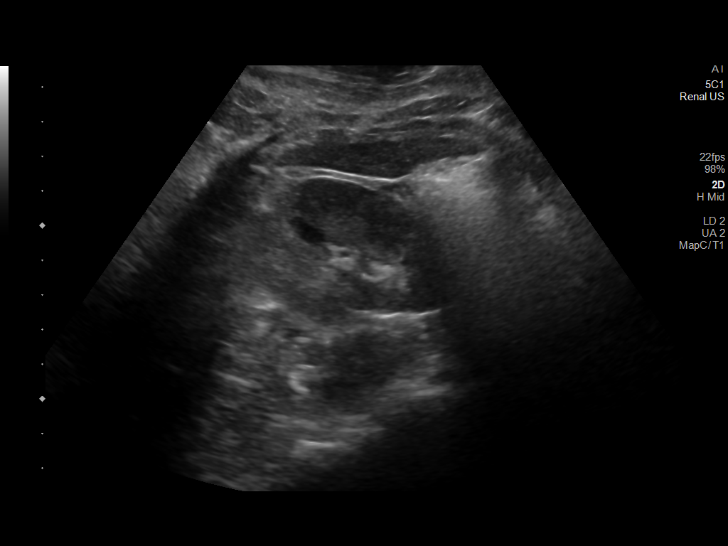
[im 16/30]
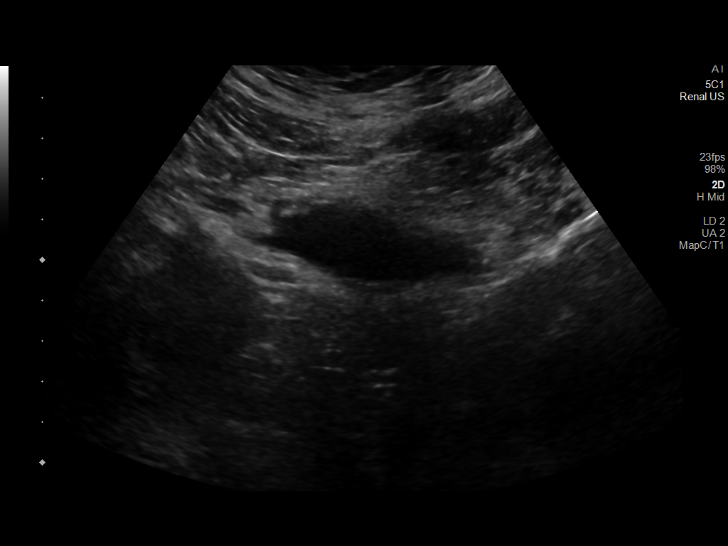
[im 19/30]
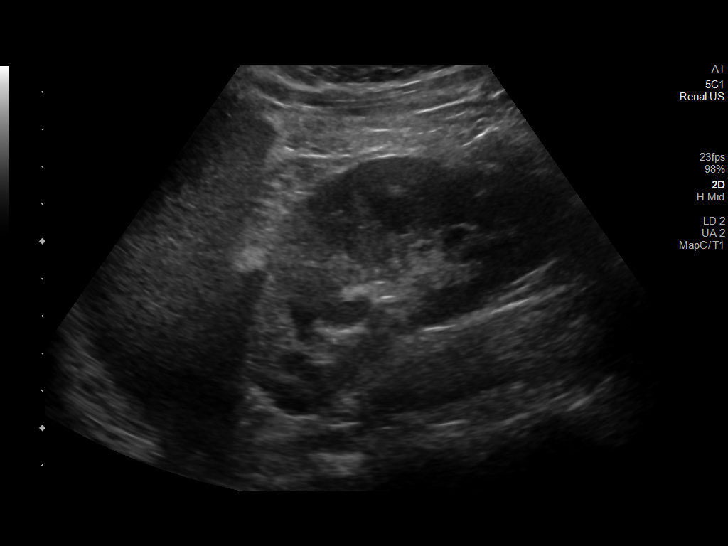
[im 20/30]
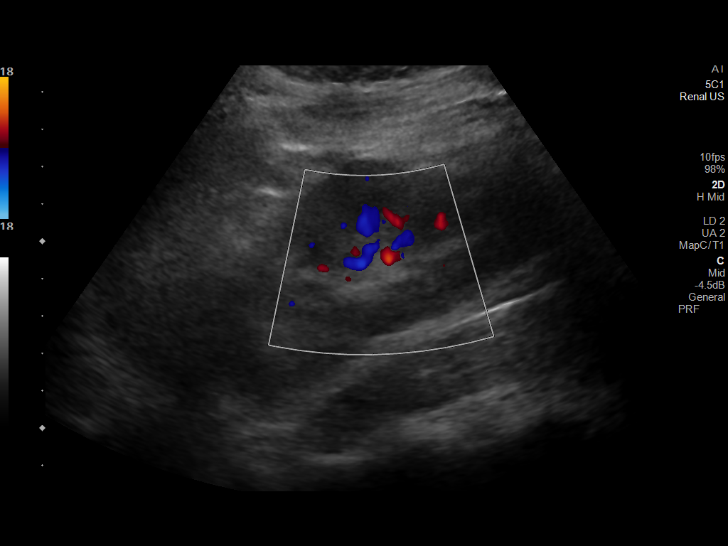
[im 22/30]
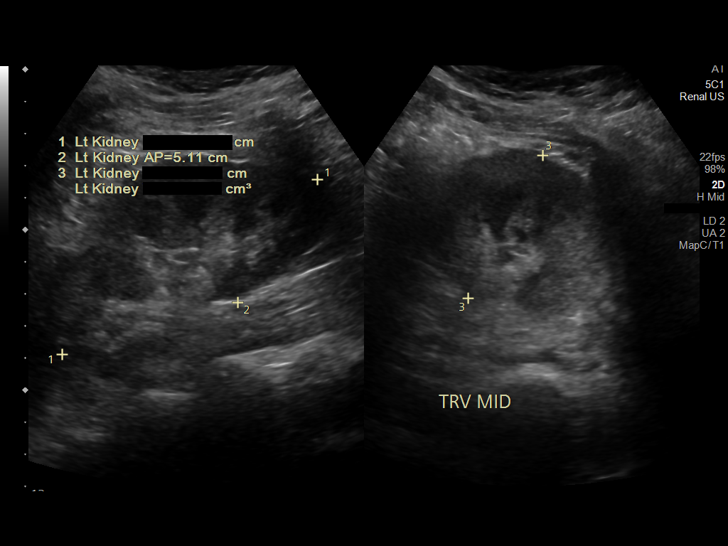
[im 25/30]
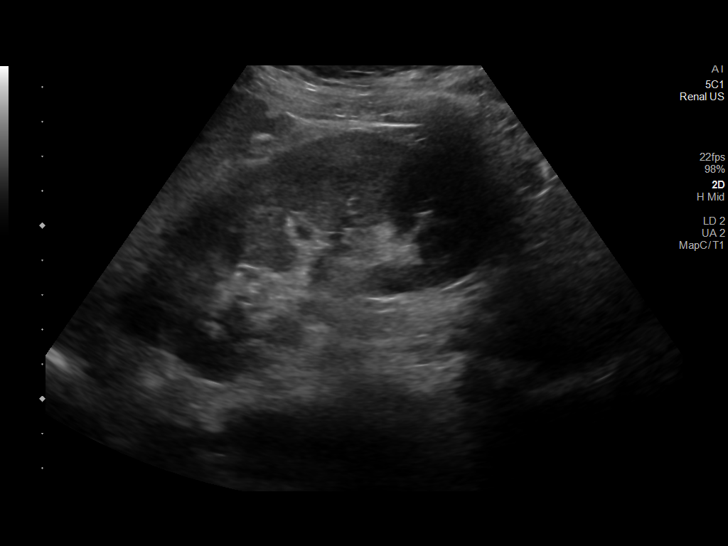
[im 27/30]
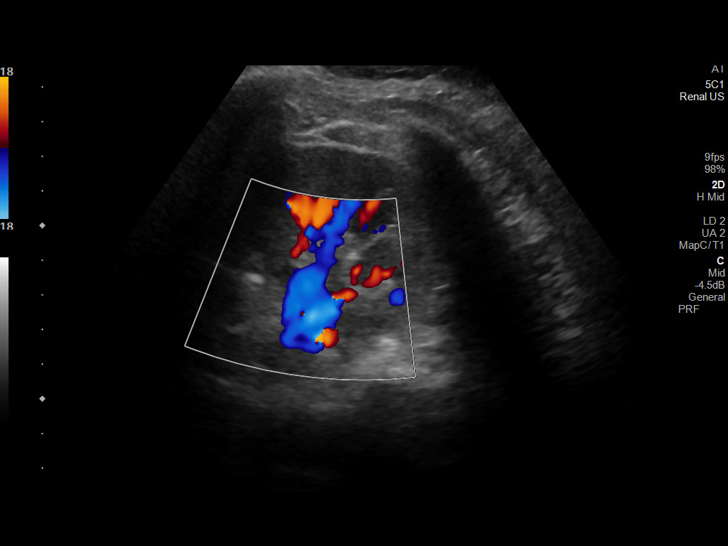
[im 30/30]
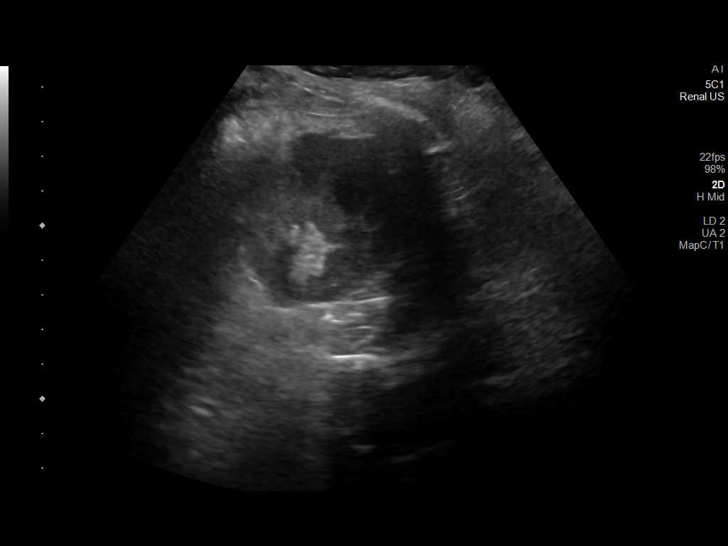

[14 of 25 positions shown; findings below may reference images not displayed]

FINDINGS: Right Kidney:

Renal measurements: 10.2 x 5.6 x 4.7 cm = volume: 140 mL. Normal
cortical thickness and echogenicity. No mass, hydronephrosis, or
shadowing calcification.

Left Kidney:

Renal measurements: 9.7 x 5.1 x 5.0 cm = volume: 130 mL. Normal
cortical thickness and echogenicity. No mass, hydronephrosis, or
shadowing calcification.

Bladder:

Unable to adequately assessed, decompressed.

Other:

N/A
IMPRESSION: Normal sonographic appearance of kidneys.

Decompressed bladder.

## 2023-08-25 ENCOUNTER — Ambulatory Visit: Payer: 59 | Admitting: Urology

## 2023-09-05 ENCOUNTER — Ambulatory Visit
Admission: RE | Admit: 2023-09-05 | Discharge: 2023-09-05 | Disposition: A | Discharge status: Home / Self Care | Payer: 59 | Source: Ambulatory Visit | Attending: Family Medicine | Admitting: Family Medicine

## 2023-09-05 ENCOUNTER — Other Ambulatory Visit: Payer: Self-pay

## 2023-09-05 VITALS — BP 129/87 | HR 85 | Temp 98.0°F | Resp 17

## 2023-09-05 DIAGNOSIS — J069 Acute upper respiratory infection, unspecified: Secondary | ICD-10-CM | POA: Diagnosis present

## 2023-09-05 DIAGNOSIS — Z1152 Encounter for screening for COVID-19: Secondary | ICD-10-CM | POA: Insufficient documentation

## 2023-09-05 LAB — POCT INFLUENZA A/B
Influenza A, POC: NEGATIVE
Influenza B, POC: NEGATIVE

## 2023-09-05 LAB — POCT RAPID STREP A (OFFICE): Rapid Strep A Screen: NEGATIVE

## 2023-09-05 MED ORDER — FLUTICASONE PROPIONATE 50 MCG/ACT NA SUSP: 1.0000 | Freq: Two times a day (BID) | NASAL | 2 refills | Status: AC

## 2023-09-05 MED ORDER — PROMETHAZINE-DM 6.25-15 MG/5ML PO SYRP: 5.0000 mL | ORAL_SOLUTION | Freq: Four times a day (QID) | ORAL | 0 refills | Status: DC | PRN

## 2023-09-05 NOTE — ED Triage Notes (Signed)
Pt is here after drinking after her sister all weekend who tested POSITIVE for strep. Pt has taken OTC meds to relieve discomfort.

## 2023-09-05 NOTE — ED Provider Notes (Signed)
[facility]    CSN: [id number] Arrival date & time: 09/05/23  1041      History   Chief Complaint Chief Complaint  Patient presents with   Generalized Body Aches    Fever Cough Runny noseCongestion Chest painSore throat Sore ears - Entered by patient    HPI Sarah Eaton is a 37 y.o. female.   Presenting today with several day history of fever, chills, body aches, cough, congestion, sore throat.  States the sore throat was severe last night but not as bad today.  Denies chest pain, shortness of breath, abdominal pain, nausea vomiting or diarrhea.  She reports when over-the-counter cold ingestion medications with minimal relief.  Recent sick contact with strep.   Past Medical History:  Diagnosis Date   Anemia    Hypertension    Obesity    Ovarian cyst    Pyelonephritis 05/04/2021   Sepsis due to Escherichia coli (E. coli) (HCC) 05/04/2021    Patient Active Problem List   Diagnosis Date Noted   Insulin resistance 11/20/2022   Adjustment disorder with mixed anxiety and depressed mood 11/20/2022   Primary insomnia 07/05/2022   Adenomyosis 05/16/2022   Left lower quadrant abdominal pain 01/02/2022   Chronic idiopathic constipation 01/02/2022   History of pyelonephritis 07/13/2021   GERD (gastroesophageal reflux disease) 05/04/2021   Migraine 05/04/2021   Obesity 10/20/2019    Past Surgical History:  Procedure Laterality Date   CESAREAN SECTION     CESAREAN SECTION WITH BILATERAL TUBAL LIGATION Bilateral 08/05/2013   Procedure: REPEAT CESAREAN SECTION WITH BILATERAL TUBAL LIGATION;  Surgeon: Allie Bossier, MD;  Location: [facility];  Service: Obstetrics;  Laterality: Bilateral;   ENDOMETRIAL ABLATION W/ NOVASURE  09/2014   LAPAROSCOPIC VAGINAL HYSTERECTOMY WITH SALPINGECTOMY N/A 04/01/2022   Procedure: LAPAROSCOPIC ASSISTED VAGINAL HYSTERECTOMY WITH SALPINGECTOMY;  Surgeon: Hildred Laser, MD;  Location: [facility];  Service: Gynecology;  Laterality: N/A;   TUBAL  LIGATION      OB History     Gravida  2   Para  2   Term  2   Preterm      AB      Living  2      SAB      IAB      Ectopic      Multiple      Live Births  2            Home Medications    Prior to Admission medications   Medication Sig Start Date End Date Taking? Authorizing Provider  fluticasone (FLONASE) 50 MCG/ACT nasal spray Place 1 spray into both nostrils 2 (two) times daily. 09/05/23  Yes Particia Nearing, PA-C  promethazine-dextromethorphan (PROMETHAZINE-DM) 6.25-15 MG/5ML syrup Take 5 mLs by mouth 4 (four) times daily as needed. 09/05/23  Yes Particia Nearing, PA-C  Estradiol [facility] MAINTENANCE PACK) 10 MCG INST Place 1 Insert vaginally 2 (two) times a week. 05/26/23   Hildred Laser, MD  Estradiol Starter Pack (IMVEXXY STARTER PACK) 10 MCG INST Place 1 Insert vaginally daily. Insert daily for 30 days, then decrease to twice weekly. 05/26/23   Hildred Laser, MD  hydrOXYzine (VISTARIL) 25 MG capsule TAKE 1 CAPSULE BY MOUTH EVERYDAY AT BEDTIME 04/30/23   Anabel Halon, MD  ibuprofen (ADVIL) 600 MG tablet Take 1 tablet (600 mg total) by mouth every 6 (six) hours as needed. 04/01/22   Hildred Laser, MD  linaclotide [facility]) 145 MCG CAPS capsule Take 1 capsule (145  mcg total) by mouth daily before breakfast. 05/01/23   Anabel Halon, MD  phentermine 37.5 MG capsule Take 1 capsule (37.5 mg total) by mouth every morning. 11/20/22   Anabel Halon, MD  rizatriptan (MAXALT) 10 MG tablet TAKE 1 TABLET BY MOUTH AS NEEDED FOR MIGRAINE. MAY REPEAT IN 2 HOURS IF NEEDED 06/06/23   Anabel Halon, MD  tirzepatide [facility]) 2.5 MG/0.5ML Pen Inject 2.5 mg into the skin once a week. 11/20/22   Anabel Halon, MD  topiramate (TOPAMAX) 25 MG tablet TAKE 1 TABLET BY MOUTH TWICE A DAY 03/24/23   Anabel Halon, MD  valACYclovir (VALTREX) 1000 MG tablet Take 1 tablet (1,000 mg total) by mouth 2 (two) times daily. Take for ten days. 09/19/22   Hildred Laser, MD    Family  History Family History  Problem Relation Age of Onset   Cancer Other        breast   Hypertension Mother    Cancer Mother        skin   Thyroid disease Mother    Cancer Maternal Grandmother    Hypertension Maternal Grandmother    Thyroid disease Father    Thyroid disease Paternal Grandmother     Social History Social History   Tobacco Use   Smoking status: Never   Smokeless tobacco: Never  Vaping Use   Vaping status: Never Used  Substance Use Topics   Alcohol use: Not Currently    Comment: occasionally   Drug use: Never     Allergies   Banana and Tuna [fish allergy]   Review of Systems Review of Systems Per HPI  Physical Exam Triage Vital Signs ED Triage Vitals  Encounter Vitals Group     BP 09/05/23 1059 129/87     Systolic BP Percentile --      Diastolic BP Percentile --      Pulse Rate 09/05/23 1059 85     Resp 09/05/23 1059 17     Temp 09/05/23 1059 98 F (36.7 C)     Temp Source 09/05/23 1059 Oral     SpO2 09/05/23 1059 96 %     Weight --      Height --      Head Circumference --      Peak Flow --      Pain Score 09/05/23 1056 0     Pain Loc --      Pain Education --      Exclude from Growth Chart --    No data found.  Updated Vital Signs BP 129/87 (BP Location: Right Arm)   Pulse 85   Temp 98 F (36.7 C) (Oral)   Resp 17   LMP 02/16/2022   SpO2 96%   Visual Acuity Right Eye Distance:   Left Eye Distance:   Bilateral Distance:    Right Eye Near:   Left Eye Near:    Bilateral Near:     Physical Exam Vitals and nursing note reviewed.  Constitutional:      Appearance: Normal appearance.  HENT:     Head: Atraumatic.     Right Ear: Tympanic membrane and external ear normal.     Left Ear: Tympanic membrane and external ear normal.     Nose: Rhinorrhea present.     Mouth/Throat:     Mouth: Mucous membranes are moist.     Pharynx: Posterior oropharyngeal erythema present.  Eyes:     Extraocular Movements: Extraocular movements  intact.  Conjunctiva/sclera: Conjunctivae normal.  Cardiovascular:     Rate and Rhythm: Normal rate and regular rhythm.     Heart sounds: Normal heart sounds.  Pulmonary:     Effort: Pulmonary effort is normal.     Breath sounds: Normal breath sounds. No wheezing or rales.  Musculoskeletal:        General: Normal range of motion.     Cervical back: Normal range of motion and neck supple.  Skin:    General: Skin is warm and dry.  Neurological:     Mental Status: She is alert and oriented to person, place, and time.  Psychiatric:        Mood and Affect: Mood normal.        Thought Content: Thought content normal.      UC Treatments / Results  Labs (all labs ordered are listed, but only abnormal results are displayed) Labs Reviewed  SARS CORONAVIRUS 2 (TAT 6-24 HRS)  POCT RAPID STREP A (OFFICE)  POCT INFLUENZA A/B    EKG   Radiology No results found.  Procedures Procedures (including critical care time)  Medications Ordered in UC Medications - No data to display  Initial Impression / Assessment and Plan / UC Course  I have reviewed the triage vital signs and the nursing notes.  Pertinent labs & imaging results that were available during my care of the patient were reviewed by me and considered in my medical decision making (see chart for details).     Rapid strep and flu negative, COVID testing pending.  Good candidate for Paxlovid if positive.  Treat with Phenergan DM, Flonase, supportive over-the-counter medications and home care.  Return for worsening symptoms.  Final Clinical Impressions(s) / UC Diagnoses   Final diagnoses:  Encounter for screening for COVID-19  Viral URI with cough   Discharge Instructions   None    ED Prescriptions     Medication Sig Dispense Auth. Provider   promethazine-dextromethorphan (PROMETHAZINE-DM) 6.25-15 MG/5ML syrup Take 5 mLs by mouth 4 (four) times daily as needed. 100 mL Particia Nearing, PA-C   fluticasone  [facility]) 50 MCG/ACT nasal spray Place 1 spray into both nostrils 2 (two) times daily. 16 g Particia Nearing, [place]      PDMP not reviewed this encounter.   Particia Nearing, [place] 09/05/23 1335

## 2023-09-06 LAB — SARS CORONAVIRUS 2 (TAT 6-24 HRS): SARS Coronavirus 2: NEGATIVE

## 2023-09-08 NOTE — Plan of Care (Signed)
[facility] Tonsillectomy/Adenoidectomy, Postoperative PEDS care plan entered in error.

## 2023-09-09 ENCOUNTER — Encounter: Payer: Self-pay | Admitting: Internal Medicine

## 2023-09-09 ENCOUNTER — Ambulatory Visit (INDEPENDENT_AMBULATORY_CARE_PROVIDER_SITE_OTHER): Payer: 59 | Admitting: Internal Medicine

## 2023-09-09 VITALS — BP 109/71 | HR 84 | Ht 62.0 in | Wt 180.2 lb

## 2023-09-09 DIAGNOSIS — K5904 Chronic idiopathic constipation: Secondary | ICD-10-CM

## 2023-09-09 DIAGNOSIS — G43009 Migraine without aura, not intractable, without status migrainosus: Secondary | ICD-10-CM | POA: Diagnosis not present

## 2023-09-09 DIAGNOSIS — E66811 Obesity, class 1: Secondary | ICD-10-CM

## 2023-09-09 DIAGNOSIS — Z0001 Encounter for general adult medical examination with abnormal findings: Secondary | ICD-10-CM

## 2023-09-09 DIAGNOSIS — E559 Vitamin D deficiency, unspecified: Secondary | ICD-10-CM

## 2023-09-09 DIAGNOSIS — J0110 Acute frontal sinusitis, unspecified: Secondary | ICD-10-CM | POA: Diagnosis not present

## 2023-09-09 DIAGNOSIS — E782 Mixed hyperlipidemia: Secondary | ICD-10-CM

## 2023-09-09 DIAGNOSIS — Z6832 Body mass index (BMI) 32.0-32.9, adult: Secondary | ICD-10-CM

## 2023-09-09 DIAGNOSIS — E88819 Insulin resistance, unspecified: Secondary | ICD-10-CM

## 2023-09-09 DIAGNOSIS — F4323 Adjustment disorder with mixed anxiety and depressed mood: Secondary | ICD-10-CM

## 2023-09-09 MED ORDER — RIZATRIPTAN BENZOATE 10 MG PO TABS: 10.0000 mg | ORAL_TABLET | ORAL | 2 refills | Status: DC | PRN

## 2023-09-09 MED ORDER — PHENTERMINE HCL 37.5 MG PO CAPS: 37.5000 mg | ORAL_CAPSULE | ORAL | 3 refills | Status: DC

## 2023-09-09 MED ORDER — AMOXICILLIN-POT CLAVULANATE 875-125 MG PO TABS: 1.0000 | ORAL_TABLET | Freq: Two times a day (BID) | ORAL | 0 refills | Status: DC

## 2023-09-09 MED ORDER — TOPIRAMATE 50 MG PO TABS: 50.0000 mg | ORAL_TABLET | Freq: Two times a day (BID) | ORAL | 1 refills | Status: DC

## 2023-09-09 NOTE — Assessment & Plan Note (Signed)
On Topiramate 25 mg BID, but was uncontrolled Increased dose of topiramate to 50 mg twice daily Maxalt PRN for migraine flare up

## 2023-09-09 NOTE — Assessment & Plan Note (Signed)
Has recent weight gain and history of prediabetes Had tolerated Wegovy well, but was not covered by insurance

## 2023-09-09 NOTE — Patient Instructions (Addendum)
Please start taking Phentermine as prescribed.  Please start taking Topiramate 25 mg twice daily for 1 week and then 50 mg twice daily.  Please start taking Augmentin as prescribed for sinusitis.  Please get fasting blood tests done after 2 weeks (once your sinus symptoms improve).

## 2023-09-09 NOTE — Progress Notes (Signed)
Established Patient Office Visit  Subjective:  Patient ID: Sarah Eaton, female    DOB: 04/20/1986  Age: 37 y.o. MRN: [id number]  CC:  Chief Complaint  Patient presents with   Annual Exam   URI    URI symptoms one week , went to urgent care , tested for covid, flu and strep all negative     HPI Sarah Eaton is a 37 y.o. female with past medical history of migraine, obesity, Shingles and pyelonephritis who presents for annual physical.  Acute sinusitis: She complains of nasal congestion, postnasal drip. sore throat and cough for the last 8 days.  She went to urgent care on 09/05/23 and had negative COVID and influenza test.  She was given Flonase and Promethazine DM syrup for symptomatic treatment.  She has had persistent symptoms since then.  She has chills, but denies any fever, dyspnea or wheezing.  Obesity: Her weight has been overall stable since the last visit.  She took phentermine for 4 months. She is following very specific low-carb diet from our nutritionist service, and gets prepackaged food in it. Of note, she admits that she has been dealing with stress due to ongoing divorce procedure. She also had hysterectomy in 2023, which could have led to some weight gain. She has tried [place], but was very expensive she had to pay out-of-pocket.  She has tried Curator and Qsymia without much benefit.  Migraine: She has frequent episodes of migraine  She used to take topiramate 25 mg twice daily, but has run out of it since 05/24.  She reports that she did not get adequate relief with that dose of topiramate.  She also has Maxalt as needed for breakthrough headache.  Past Medical History:  Diagnosis Date   Anemia    Hypertension    Obesity    Ovarian cyst    Pyelonephritis 05/04/2021   Sepsis due to Escherichia coli (E. coli) (HCC) 05/04/2021    Past Surgical History:  Procedure Laterality Date   CESAREAN SECTION     CESAREAN SECTION WITH BILATERAL TUBAL LIGATION Bilateral  08/05/2013   Procedure: REPEAT CESAREAN SECTION WITH BILATERAL TUBAL LIGATION;  Surgeon: Allie Bossier, MD;  Location: [facility];  Service: Obstetrics;  Laterality: Bilateral;   ENDOMETRIAL ABLATION W/ NOVASURE  09/2014   LAPAROSCOPIC VAGINAL HYSTERECTOMY WITH SALPINGECTOMY N/A 04/01/2022   Procedure: LAPAROSCOPIC ASSISTED VAGINAL HYSTERECTOMY WITH SALPINGECTOMY;  Surgeon: Hildred Laser, MD;  Location: [facility];  Service: Gynecology;  Laterality: N/A;   TUBAL LIGATION      Family History  Problem Relation Age of Onset   Cancer Other        breast   Hypertension Mother    Cancer Mother        skin   Thyroid disease Mother    Cancer Maternal Grandmother    Hypertension Maternal Grandmother    Thyroid disease Father    Thyroid disease Paternal Grandmother     Social History   Socioeconomic History   Marital status: Legally Separated    Spouse name: Not on file   Number of children: Not on file   Years of education: Not on file   Highest education level: Not on file  Occupational History   Not on file  Tobacco Use   Smoking status: Never   Smokeless tobacco: Never  Vaping Use   Vaping status: Never Used  Substance and Sexual Activity   Alcohol use: Not Currently    Comment: occasionally  Drug use: Never   Sexual activity: Yes    Birth control/protection: Surgical  Other Topics Concern   Not on file  Social History Narrative   Live at home with children.     Social Determinants of Health   Financial Resource Strain: Not on file  Food Insecurity: Not on file  Transportation Needs: Not on file  Physical Activity: Not on file  Stress: Not on file  Social Connections: Not on file  Intimate Partner Violence: Not on file    Outpatient Medications Prior to Visit  Medication Sig Dispense Refill   Estradiol (IMVEXXY MAINTENANCE PACK) 10 MCG INST Place 1 Insert vaginally 2 (two) times a week. 8 each 11   Estradiol Starter Pack (IMVEXXY STARTER PACK) 10 MCG INST Place 1  Insert vaginally daily. Insert daily for 30 days, then decrease to twice weekly. 30 each 0   fluticasone (FLONASE) 50 MCG/ACT nasal spray Place 1 spray into both nostrils 2 (two) times daily. 16 g 2   hydrOXYzine (VISTARIL) 25 MG capsule TAKE 1 CAPSULE BY MOUTH EVERYDAY AT BEDTIME 90 capsule 2   linaclotide (LINZESS) 145 MCG CAPS capsule Take 1 capsule (145 mcg total) by mouth daily before breakfast. 30 capsule 5   promethazine-dextromethorphan (PROMETHAZINE-DM) 6.25-15 MG/5ML syrup Take 5 mLs by mouth 4 (four) times daily as needed. 100 mL 0   valACYclovir (VALTREX) 1000 MG tablet Take 1 tablet (1,000 mg total) by mouth 2 (two) times daily. Take for ten days. 20 tablet 3   ibuprofen (ADVIL) 600 MG tablet Take 1 tablet (600 mg total) by mouth every 6 (six) hours as needed. 60 tablet 1   phentermine 37.5 MG capsule Take 1 capsule (37.5 mg total) by mouth every morning. 30 capsule 3   rizatriptan (MAXALT) 10 MG tablet TAKE 1 TABLET BY MOUTH AS NEEDED FOR MIGRAINE. MAY REPEAT IN 2 HOURS IF NEEDED 4 tablet 2   tirzepatide (MOUNJARO) 2.5 MG/0.5ML Pen Inject 2.5 mg into the skin once a week. 2 mL 0   topiramate (TOPAMAX) 25 MG tablet TAKE 1 TABLET BY MOUTH TWICE A DAY 180 tablet 0   No facility-administered medications prior to visit.    Allergies  Allergen Reactions   Banana Anaphylaxis   [place] Gum [Fish Allergy] Anaphylaxis    Tuna fish only per pt    ROS Review of Systems  Constitutional:  Positive for chills. Negative for fever.  HENT:  Positive for congestion, sinus pressure, sinus pain and sore throat.   Eyes:  Negative for pain and discharge.  Respiratory:  Positive for cough. Negative for shortness of breath.   Cardiovascular:  Negative for chest pain and palpitations.  Gastrointestinal:  Positive for constipation. Negative for diarrhea, nausea and vomiting.  Endocrine: Negative for polydipsia and polyuria.  Genitourinary:  Negative for dysuria and hematuria.  Musculoskeletal:  Negative  for neck pain and neck stiffness.  Skin:  Negative for rash.  Neurological:  Positive for headaches. Negative for dizziness and weakness.  Psychiatric/Behavioral:  Negative for agitation and behavioral problems. The patient is nervous/anxious.       Objective:    Physical Exam Vitals reviewed.  Constitutional:      General: She is not in acute distress.    Appearance: She is not diaphoretic.  HENT:     Head: Normocephalic and atraumatic.     Nose: Congestion present.     Right Sinus: Frontal sinus tenderness present.     Left Sinus: Frontal sinus tenderness present.  Mouth/Throat:     Mouth: Mucous membranes are moist.  Eyes:     General: No scleral icterus.    Extraocular Movements: Extraocular movements intact.  Cardiovascular:     Rate and Rhythm: Normal rate and regular rhythm.     Pulses: Normal pulses.     Heart sounds: Normal heart sounds. No murmur heard. Pulmonary:     Breath sounds: Normal breath sounds. No wheezing or rales.  Abdominal:     Palpations: Abdomen is soft.     Tenderness: There is no abdominal tenderness.  Musculoskeletal:     Cervical back: Neck supple. No tenderness.     Right lower leg: No edema.     Left lower leg: No edema.  Skin:    General: Skin is warm.     Findings: No rash.  Neurological:     General: No focal deficit present.     Mental Status: She is alert and oriented to person, place, and time.     Sensory: No sensory deficit.     Motor: No weakness.  Psychiatric:        Mood and Affect: Mood normal.        Behavior: Behavior normal.     BP 109/71 (BP Location: Left Arm, Patient Position: Sitting, Cuff Size: Normal)   Pulse 84   Ht 5\' 2"  (1.575 m)   Wt 180 lb 3.2 oz (81.7 kg)   LMP 02/16/2022   SpO2 98%   BMI 32.96 kg/m  Wt Readings from Last 3 Encounters:  09/09/23 180 lb 3.2 oz (81.7 kg)  11/20/22 178 lb 12.8 oz (81.1 kg)  07/02/22 145 lb 6.4 oz (66 kg)    Lab Results  Component Value Date   TSH 2.490  11/20/2022   Lab Results  Component Value Date   WBC 6.6 06/19/2022   HGB 13.3 06/19/2022   HCT 39.3 06/19/2022   MCV 90.8 06/19/2022   PLT 277 06/19/2022   Lab Results  Component Value Date   NA 140 11/20/2022   K 4.4 11/20/2022   CO2 20 11/20/2022   GLUCOSE 85 11/20/2022   BUN 13 11/20/2022   CREATININE 0.93 11/20/2022   BILITOT <0.2 11/20/2022   ALKPHOS 59 11/20/2022   AST 17 11/20/2022   ALT 18 11/20/2022   PROT 7.4 11/20/2022   ALBUMIN 4.7 11/20/2022   CALCIUM 9.4 11/20/2022   ANIONGAP 6 06/19/2022   EGFR 83 11/20/2022   Lab Results  Component Value Date   CHOL 184 07/25/2021   Lab Results  Component Value Date   HDL 59 07/25/2021   Lab Results  Component Value Date   LDLCALC 112 (H) 07/25/2021   Lab Results  Component Value Date   TRIG 67 07/25/2021   Lab Results  Component Value Date   CHOLHDL 3.1 07/25/2021   Lab Results  Component Value Date   HGBA1C 5.1 11/20/2022      Assessment & Plan:   Problem List Items Addressed This Visit       Cardiovascular and Mediastinum   Migraine    On Topiramate 25 mg BID, but was uncontrolled Increased dose of topiramate to 50 mg twice daily Maxalt PRN for migraine flare up      Relevant Medications   topiramate (TOPAMAX) 50 MG tablet   rizatriptan (MAXALT) 10 MG tablet   Other Relevant Orders   TSH   CMP14+EGFR   CBC with Differential/Platelet     Respiratory   Acute non-recurrent frontal sinusitis  Started empiric Augmentin as she has persistent symptoms despite symptomatic treatment Continue Flonase for allergies/nasal congestion Promethazine DM as needed for cough       Relevant Medications   amoxicillin-clavulanate (AUGMENTIN) 875-125 MG tablet     Digestive   Chronic idiopathic constipation    Has tried Colace, senna, MiraLAX and Dulcolax in the past Had responded well to Linzess now Will refer to GI if persistent concern      Relevant Orders   TSH     Endocrine   Insulin  resistance    Has recent weight gain and history of prediabetes Had tolerated Wegovy well, but was not covered by insurance      Relevant Orders   Hemoglobin A1c     Other   Obesity    BMI Readings from Last 3 Encounters:  09/09/23 32.96 kg/m  11/20/22 32.70 kg/m  07/02/22 26.59 kg/m   She has been following low carb diet and portion control Exercises regularly BMI 31.17 when Wegovy started, had lost 25 lbs with it, could not continue due to cost concern Has tried Qsymia and Contrave without much benefit Started phentermine 37.5 mg QD again      Relevant Medications   phentermine 37.5 MG capsule   Adjustment disorder with mixed anxiety and depressed mood    Likely due to stress from the divorce in 04/24 She is worried about her finances as well, takes care of her 2 children May be a cause for weight gain, although she reports following low carb diet Can try relaxation techniques If persistent, can start SSRI      Encounter for general adult medical examination with abnormal findings - Primary    Physical exam as documented. Fasting blood tests ordered. Flu vaccine postponed as she has acute sinusitis currently.      Other Visit Diagnoses     Vitamin D deficiency       Relevant Orders   VITAMIN D 25 Hydroxy (Vit-D Deficiency, Fractures)   Mixed hyperlipidemia       Relevant Orders   Lipid panel       Meds ordered this encounter  Medications   topiramate (TOPAMAX) 50 MG tablet    Sig: Take 1 tablet (50 mg total) by mouth 2 (two) times daily.    Dispense:  180 tablet    Refill:  1   rizatriptan (MAXALT) 10 MG tablet    Sig: Take 1 tablet (10 mg total) by mouth as needed for migraine. May repeat in 2 hours if needed    Dispense:  10 tablet    Refill:  2   amoxicillin-clavulanate (AUGMENTIN) 875-125 MG tablet    Sig: Take 1 tablet by mouth 2 (two) times daily.    Dispense:  14 tablet    Refill:  0   phentermine 37.5 MG capsule    Sig: Take 1 capsule  (37.5 mg total) by mouth every morning.    Dispense:  30 capsule    Refill:  3    Follow-up: Return in about 4 months (around 01/10/2024) for Weight management.    Anabel Halon, MD

## 2023-09-09 NOTE — Assessment & Plan Note (Signed)
Started empiric Augmentin as she has persistent symptoms despite symptomatic treatment Continue Flonase for allergies/nasal congestion Promethazine DM as needed for cough

## 2023-09-09 NOTE — Assessment & Plan Note (Signed)
Has tried Colace, senna, MiraLAX and Dulcolax in the past Had responded well to Linzess now Will refer to GI if persistent concern

## 2023-09-09 NOTE — Assessment & Plan Note (Signed)
Physical exam as documented. Fasting blood tests ordered. Flu vaccine postponed as she has acute sinusitis currently.

## 2023-09-09 NOTE — Assessment & Plan Note (Signed)
Likely due to stress from the divorce in 04/24 She is worried about her finances as well, takes care of her 2 children May be a cause for weight gain, although she reports following low carb diet Can try relaxation techniques If persistent, can start SSRI

## 2023-09-09 NOTE — Assessment & Plan Note (Signed)
BMI Readings from Last 3 Encounters:  09/09/23 32.96 kg/m  11/20/22 32.70 kg/m  07/02/22 26.59 kg/m   She has been following low carb diet and portion control Exercises regularly BMI 31.17 when Wegovy started, had lost 25 lbs with it, could not continue due to cost concern Has tried Qsymia and Contrave without much benefit Started phentermine 37.5 mg QD again

## 2023-09-10 ENCOUNTER — Encounter: Payer: Self-pay | Admitting: Internal Medicine

## 2023-09-10 ENCOUNTER — Other Ambulatory Visit: Payer: Self-pay | Admitting: Internal Medicine

## 2023-09-10 DIAGNOSIS — F5101 Primary insomnia: Secondary | ICD-10-CM

## 2023-09-10 MED ORDER — HYDROXYZINE PAMOATE 50 MG PO CAPS: 50.0000 mg | ORAL_CAPSULE | Freq: Every evening | ORAL | 1 refills | Status: DC | PRN

## 2023-09-18 ENCOUNTER — Other Ambulatory Visit: Payer: Self-pay

## 2023-09-18 ENCOUNTER — Encounter: Payer: Self-pay | Admitting: Obstetrics and Gynecology

## 2023-09-18 DIAGNOSIS — B009 Herpesviral infection, unspecified: Secondary | ICD-10-CM

## 2023-10-03 ENCOUNTER — Ambulatory Visit (INDEPENDENT_AMBULATORY_CARE_PROVIDER_SITE_OTHER): Payer: 59

## 2023-10-03 DIAGNOSIS — Z23 Encounter for immunization: Secondary | ICD-10-CM | POA: Diagnosis not present

## 2023-10-04 LAB — HEMOGLOBIN A1C
Est. average glucose Bld gHb Est-mCnc: 97 mg/dL
Hgb A1c MFr Bld: 5 % (ref 4.8–5.6)

## 2023-10-04 LAB — CMP14+EGFR
ALT: 13 [IU]/L (ref 0–32)
AST: 13 [IU]/L (ref 0–40)
Albumin: 4.2 g/dL (ref 3.9–4.9)
Alkaline Phosphatase: 63 [IU]/L (ref 44–121)
BUN/Creatinine Ratio: 14 (ref 9–23)
BUN: 12 mg/dL (ref 6–20)
Bilirubin Total: 0.3 mg/dL (ref 0.0–1.2)
CO2: 23 mmol/L (ref 20–29)
Calcium: 9.1 mg/dL (ref 8.7–10.2)
Chloride: 103 mmol/L (ref 96–106)
Creatinine, Ser: 0.87 mg/dL (ref 0.57–1.00)
Globulin, Total: 2.4 g/dL (ref 1.5–4.5)
Glucose: 76 mg/dL (ref 70–99)
Potassium: 4.7 mmol/L (ref 3.5–5.2)
Sodium: 138 mmol/L (ref 134–144)
Total Protein: 6.6 g/dL (ref 6.0–8.5)
eGFR: 89 mL/min/{1.73_m2} (ref >59)

## 2023-10-04 LAB — CBC WITH DIFFERENTIAL/PLATELET
Basophils Absolute: 0 10*3/uL (ref 0.0–0.2)
Basos: 1 %
EOS (ABSOLUTE): 0.1 10*3/uL (ref 0.0–0.4)
Eos: 2 %
Hematocrit: 41.3 % (ref 34.0–46.6)
Hemoglobin: 13.3 g/dL (ref 11.1–15.9)
Immature Grans (Abs): 0 10*3/uL (ref 0.0–0.1)
Immature Granulocytes: 0 %
Lymphocytes Absolute: 3.1 10*3/uL (ref 0.7–3.1)
Lymphs: 43 %
MCH: 30.4 pg (ref 26.6–33.0)
MCHC: 32.2 g/dL (ref 31.5–35.7)
MCV: 94 fL (ref 79–97)
Monocytes Absolute: 0.4 10*3/uL (ref 0.1–0.9)
Monocytes: 5 %
Neutrophils Absolute: 3.6 10*3/uL (ref 1.4–7.0)
Neutrophils: 49 %
Platelets: 326 10*3/uL (ref 150–450)
RBC: 4.38 x10E6/uL (ref 3.77–5.28)
RDW: 12.6 % (ref 11.7–15.4)
WBC: 7.3 10*3/uL (ref 3.4–10.8)

## 2023-10-04 LAB — LIPID PANEL
Chol/HDL Ratio: 2.5 ratio (ref 0.0–4.4)
Cholesterol, Total: 149 mg/dL (ref 100–199)
HDL: 60 mg/dL (ref >39)
LDL Chol Calc (NIH): 79 mg/dL (ref 0–99)
Triglycerides: 42 mg/dL (ref 0–149)
VLDL Cholesterol Cal: 10 mg/dL (ref 5–40)

## 2023-10-04 LAB — VITAMIN D 25 HYDROXY (VIT D DEFICIENCY, FRACTURES): Vit D, 25-Hydroxy: 39.2 ng/mL (ref 30.0–100.0)

## 2023-10-04 LAB — TSH: TSH: 1.09 u[IU]/mL (ref 0.450–4.500)

## 2023-12-09 ENCOUNTER — Encounter: Payer: Self-pay | Admitting: Internal Medicine

## 2023-12-10 ENCOUNTER — Encounter: Payer: Self-pay | Admitting: Internal Medicine

## 2024-01-12 ENCOUNTER — Ambulatory Visit: Payer: 59 | Admitting: Internal Medicine

## 2024-01-12 ENCOUNTER — Encounter: Payer: Self-pay | Admitting: Internal Medicine

## 2024-01-12 VITALS — BP 119/80 | HR 105 | Ht 62.0 in | Wt 168.4 lb

## 2024-01-12 DIAGNOSIS — F4321 Adjustment disorder with depressed mood: Secondary | ICD-10-CM | POA: Diagnosis not present

## 2024-01-12 DIAGNOSIS — G43009 Migraine without aura, not intractable, without status migrainosus: Secondary | ICD-10-CM

## 2024-01-12 DIAGNOSIS — E66811 Obesity, class 1: Secondary | ICD-10-CM | POA: Diagnosis not present

## 2024-01-12 DIAGNOSIS — F4323 Adjustment disorder with mixed anxiety and depressed mood: Secondary | ICD-10-CM | POA: Diagnosis not present

## 2024-01-12 DIAGNOSIS — Z6832 Body mass index (BMI) 32.0-32.9, adult: Secondary | ICD-10-CM

## 2024-01-12 DIAGNOSIS — K5904 Chronic idiopathic constipation: Secondary | ICD-10-CM

## 2024-01-12 MED ORDER — SERTRALINE HCL 25 MG PO TABS: 25.0000 mg | ORAL_TABLET | Freq: Every day | ORAL | 3 refills | Status: DC

## 2024-01-12 NOTE — Assessment & Plan Note (Signed)
 Gets grief counseling through her workplace If persistent, will need [facility] therapy referral for PTSD as well

## 2024-01-12 NOTE — Assessment & Plan Note (Signed)
 On Topiramate 25 mg BID, but was uncontrolled Increased dose of topiramate to 50 mg twice daily - now improved Maxalt PRN for migraine flare up

## 2024-01-12 NOTE — Assessment & Plan Note (Signed)
 BMI Readings from Last 3 Encounters:  01/12/24 30.80 kg/m  09/09/23 32.96 kg/m  11/20/22 32.70 kg/m   She has been following low carb diet and portion control Exercises regularly BMI 31.17 when Wegovy started, had lost 25 lbs with it, could not continue due to cost concern Has tried Qsymia and Contrave without much benefit On phentermine 37.5 mg QD again

## 2024-01-12 NOTE — Assessment & Plan Note (Signed)
 Has tried Colace, senna, MiraLAX and Dulcolax in the past Had responded well to Linzess now Will refer to GI if persistent concern

## 2024-01-12 NOTE — Assessment & Plan Note (Addendum)
 Likely due to stress from recent loss of her fiance She is getting grief counseling currently through her workplace Has adequate emotional support currently Started Zoloft due to recurrent anhedonia Hydroxyzine 50 mg PRN for insomnia She is worried about her finances as well, takes care of her 2 children Can try relaxation techniques

## 2024-01-12 NOTE — Progress Notes (Addendum)
 Established Patient Office Visit  Subjective:  Patient ID: Sarah Eaton, female    DOB: Mar 01, 1986  Age: 38 y.o. MRN: [id number]  CC:  Chief Complaint  Patient presents with   Care Management    4 month f/u for weight management, would like to discuss depression concerns.    HPI Sarah Eaton is a 38 y.o. female with past medical history of migraine, obesity, Shingles and pyelonephritis who presents for f/u of her chronic medical conditions.  Grief reaction and adjustment disorder: She lost her fianc about 7 weeks ago.  She had to give him chest compressions at home, which has led to flashbacks of it.  They used to work in the same office, and she has felt worse since returning to work in the last week.  She is getting grief counseling through her workplace.  She is still feels depressed, insomnia and panic episodes at times.  She has crying spells.  Her father has moved in with her for emotional support.  She also has a emotional support dog currently.  Denies any SI or HI currently.  Obesity: She has lost about 12 lbs since the last visit.  She took phentermine  for 4 months. She is following very specific low-carb diet from nutritionist service, and gets prepackaged food in it. Of note, she admits that she has been dealing with stress due to the recent loss of her fianc. She has tried Wegovy , but was very expensive she had to pay out-of-pocket.  She has tried Contrave  and Qsymia  without much benefit.  Migraine: She takes topiramate  50 mg twice daily. She had  adequate relief with that dose of topiramate .  She also has Maxalt  as needed for breakthrough headache.  Past Medical History:  Diagnosis Date   Anemia    Hypertension    Obesity    Ovarian cyst    Pyelonephritis 05/04/2021   Sepsis due to Escherichia coli (E. coli) (HCC) 05/04/2021    Past Surgical History:  Procedure Laterality Date   CESAREAN SECTION     CESAREAN SECTION WITH BILATERAL TUBAL LIGATION Bilateral  08/05/2013   Procedure: REPEAT CESAREAN SECTION WITH BILATERAL TUBAL LIGATION;  Surgeon: Harland JAYSON Birkenhead, MD;  Location: [facility];  Service: Obstetrics;  Laterality: Bilateral;   ENDOMETRIAL ABLATION W/ NOVASURE  09/2014   LAPAROSCOPIC VAGINAL HYSTERECTOMY WITH SALPINGECTOMY N/A 04/01/2022   Procedure: LAPAROSCOPIC ASSISTED VAGINAL HYSTERECTOMY WITH SALPINGECTOMY;  Surgeon: Connell Davies, MD;  Location: [facility];  Service: Gynecology;  Laterality: N/A;   TUBAL LIGATION      Family History  Problem Relation Age of Onset   Cancer Other        breast   Hypertension Mother    Cancer Mother        skin   Thyroid  disease Mother    Cancer Maternal Grandmother    Hypertension Maternal Grandmother    Thyroid  disease Father    Thyroid  disease Paternal Grandmother     Social History   Socioeconomic History   Marital status: Legally Separated    Spouse name: Not on file   Number of children: Not on file   Years of education: Not on file   Highest education level: Not on file  Occupational History   Not on file  Tobacco Use   Smoking status: Never   Smokeless tobacco: Never  Vaping Use   Vaping status: Never Used  Substance and Sexual Activity   Alcohol use: Not Currently    Comment: occasionally  Drug use: Never   Sexual activity: Yes    Birth control/protection: Surgical  Other Topics Concern   Not on file  Social History Narrative   Live at home with children.     Social Drivers of Corporate investment banker Strain: Not on file  Food Insecurity: Not on file  Transportation Needs: Not on file  Physical Activity: Not on file  Stress: Not on file  Social Connections: Not on file  Intimate Partner Violence: Not on file    Outpatient Medications Prior to Visit  Medication Sig Dispense Refill   Estradiol  (IMVEXXY  MAINTENANCE PACK) 10 MCG INST Place 1 Insert vaginally 2 (two) times a week. 8 each 11   Estradiol  Starter Pack (IMVEXXY  STARTER PACK) 10 MCG INST Place 1 Insert  vaginally daily. Insert daily for 30 days, then decrease to twice weekly. 30 each 0   fluticasone  (FLONASE ) 50 MCG/ACT nasal spray Place 1 spray into both nostrils 2 (two) times daily. 16 g 2   hydrOXYzine  (VISTARIL ) 50 MG capsule Take 1 capsule (50 mg total) by mouth at bedtime as needed for anxiety (Insomnia). 90 capsule 1   linaclotide  (LINZESS ) 145 MCG CAPS capsule Take 1 capsule (145 mcg total) by mouth daily before breakfast. 30 capsule 5   phentermine  37.5 MG capsule Take 1 capsule (37.5 mg total) by mouth every morning. 30 capsule 3   rizatriptan  (MAXALT ) 10 MG tablet Take 1 tablet (10 mg total) by mouth as needed for migraine. May repeat in 2 hours if needed 10 tablet 2   topiramate  (TOPAMAX ) 50 MG tablet Take 1 tablet (50 mg total) by mouth 2 (two) times daily. 180 tablet 1   valACYclovir  (VALTREX ) 1000 MG tablet TAKE 1 TABLET (1,000 MG TOTAL) BY MOUTH 2 (TWO) TIMES DAILY. TAKE FOR TEN DAYS. 20 tablet 3   amoxicillin -clavulanate (AUGMENTIN ) 875-125 MG tablet Take 1 tablet by mouth 2 (two) times daily. 14 tablet 0   promethazine -dextromethorphan (PROMETHAZINE -DM) 6.25-15 MG/5ML syrup Take 5 mLs by mouth 4 (four) times daily as needed. 100 mL 0   No facility-administered medications prior to visit.    Allergies  Allergen Reactions   Banana Anaphylaxis   Richelle [Fish Allergy] Anaphylaxis    Tuna fish only per pt    ROS Review of Systems  Constitutional:  Negative for chills and fever.  HENT:  Negative for congestion, sinus pressure, sinus pain and sore throat.   Eyes:  Negative for pain and discharge.  Respiratory:  Negative for cough and shortness of breath.   Cardiovascular:  Negative for chest pain and palpitations.  Gastrointestinal:  Positive for constipation. Negative for diarrhea, nausea and vomiting.  Endocrine: Negative for polydipsia and polyuria.  Genitourinary:  Negative for dysuria and hematuria.  Musculoskeletal:  Negative for neck pain and neck stiffness.  Skin:   Negative for rash.  Neurological:  Positive for headaches. Negative for dizziness and weakness.  Psychiatric/Behavioral:  Positive for dysphoric mood and sleep disturbance. Negative for agitation and behavioral problems. The patient is nervous/anxious.       Objective:    Physical Exam Vitals reviewed.  Constitutional:      General: She is not in acute distress.    Appearance: She is not diaphoretic.  HENT:     Head: Normocephalic and atraumatic.     Nose: No congestion.     Mouth/Throat:     Mouth: Mucous membranes are moist.  Eyes:     General: No scleral icterus.    Extraocular  Movements: Extraocular movements intact.  Cardiovascular:     Rate and Rhythm: Normal rate and regular rhythm.     Pulses: Normal pulses.     Heart sounds: Normal heart sounds. No murmur heard. Pulmonary:     Breath sounds: Normal breath sounds. No wheezing or rales.  Musculoskeletal:     Cervical back: Neck supple. No tenderness.     Right lower leg: No edema.     Left lower leg: No edema.  Skin:    General: Skin is warm.     Findings: No rash.  Neurological:     General: No focal deficit present.     Mental Status: She is alert and oriented to person, place, and time.     Sensory: No sensory deficit.     Motor: No weakness.  Psychiatric:        Mood and Affect: Mood is depressed. Affect is tearful.        Behavior: Behavior is cooperative.     BP 119/80   Pulse (!) 105   Ht 5' 2 (1.575 m)   Wt 168 lb 6.4 oz (76.4 kg)   LMP 02/16/2022   SpO2 98%   BMI 30.80 kg/m  Wt Readings from Last 3 Encounters:  01/12/24 168 lb 6.4 oz (76.4 kg)  09/09/23 180 lb 3.2 oz (81.7 kg)  11/20/22 178 lb 12.8 oz (81.1 kg)    Lab Results  Component Value Date   TSH 1.090 10/03/2023   Lab Results  Component Value Date   WBC 7.3 10/03/2023   HGB 13.3 10/03/2023   HCT 41.3 10/03/2023   MCV 94 10/03/2023   PLT 326 10/03/2023   Lab Results  Component Value Date   NA 138 10/03/2023   K 4.7  10/03/2023   CO2 23 10/03/2023   GLUCOSE 76 10/03/2023   BUN 12 10/03/2023   CREATININE 0.87 10/03/2023   BILITOT 0.3 10/03/2023   ALKPHOS 63 10/03/2023   AST 13 10/03/2023   ALT 13 10/03/2023   PROT 6.6 10/03/2023   ALBUMIN 4.2 10/03/2023   CALCIUM 9.1 10/03/2023   ANIONGAP 6 06/19/2022   EGFR 89 10/03/2023   Lab Results  Component Value Date   CHOL 149 10/03/2023   Lab Results  Component Value Date   HDL 60 10/03/2023   Lab Results  Component Value Date   LDLCALC 79 10/03/2023   Lab Results  Component Value Date   TRIG 42 10/03/2023   Lab Results  Component Value Date   CHOLHDL 2.5 10/03/2023   Lab Results  Component Value Date   HGBA1C 5.0 10/03/2023      Assessment & Plan:   Problem List Items Addressed This Visit       Cardiovascular and Mediastinum   Migraine   On Topiramate  25 mg BID, but was uncontrolled Increased dose of topiramate  to 50 mg twice daily - now improved Maxalt  PRN for migraine flare up      Relevant Medications   sertraline  (ZOLOFT ) 25 MG tablet     Digestive   Chronic idiopathic constipation   Has tried Colace, senna, MiraLAX  and Dulcolax in the past Had responded well to Linzess  now Will refer to GI if persistent concern        Other   Obesity   BMI Readings from Last 3 Encounters:  01/12/24 30.80 kg/m  09/09/23 32.96 kg/m  11/20/22 32.70 kg/m   She has been following low carb diet and portion control Exercises regularly BMI 31.17  when Wegovy  started, had lost 25 lbs with it, could not continue due to cost concern Has tried Qsymia  and Contrave  without much benefit On phentermine  37.5 mg QD again      Adjustment disorder with mixed anxiety and depressed mood - Primary   Likely due to stress from recent loss of her fiance She is getting grief counseling currently through her workplace Has adequate emotional support currently Started Zoloft  due to recurrent anhedonia Hydroxyzine  50 mg PRN for insomnia She is  worried about her finances as well, takes care of her 2 children Can try relaxation techniques      Relevant Medications   sertraline  (ZOLOFT ) 25 MG tablet   Grief reaction   Gets grief counseling through her workplace If persistent, will need BH therapy referral for PTSD as well        Meds ordered this encounter  Medications   sertraline  (ZOLOFT ) 25 MG tablet    Sig: Take 1 tablet (25 mg total) by mouth daily.    Dispense:  30 tablet    Refill:  3    Follow-up: Return in about 6 weeks (around 02/23/2024) for MDD.    Suzzane MARLA Blanch, MD

## 2024-01-12 NOTE — Patient Instructions (Signed)
 Please start taking Zoloft as prescribed.  Please continue to take medications as prescribed.  Please continue to follow low carb diet and perform moderate exercise/walking at least 150 mins/week.

## 2024-02-04 ENCOUNTER — Other Ambulatory Visit: Payer: Self-pay | Admitting: Internal Medicine

## 2024-02-04 DIAGNOSIS — F4323 Adjustment disorder with mixed anxiety and depressed mood: Secondary | ICD-10-CM

## 2024-02-11 ENCOUNTER — Encounter: Payer: Self-pay | Admitting: Internal Medicine

## 2024-02-11 ENCOUNTER — Other Ambulatory Visit: Payer: Self-pay | Admitting: Internal Medicine

## 2024-02-23 ENCOUNTER — Other Ambulatory Visit: Payer: Self-pay | Admitting: Internal Medicine

## 2024-02-23 DIAGNOSIS — Z6832 Body mass index (BMI) 32.0-32.9, adult: Secondary | ICD-10-CM

## 2024-02-23 DIAGNOSIS — K5904 Chronic idiopathic constipation: Secondary | ICD-10-CM

## 2024-03-12 ENCOUNTER — Ambulatory Visit: Payer: 59 | Admitting: Internal Medicine

## 2024-03-12 ENCOUNTER — Encounter: Payer: Self-pay | Admitting: Internal Medicine

## 2024-03-12 VITALS — BP 108/73 | HR 82 | Ht 62.0 in | Wt 168.4 lb

## 2024-03-12 DIAGNOSIS — K5904 Chronic idiopathic constipation: Secondary | ICD-10-CM | POA: Diagnosis not present

## 2024-03-12 DIAGNOSIS — G43009 Migraine without aura, not intractable, without status migrainosus: Secondary | ICD-10-CM

## 2024-03-12 DIAGNOSIS — F5101 Primary insomnia: Secondary | ICD-10-CM

## 2024-03-12 DIAGNOSIS — F4323 Adjustment disorder with mixed anxiety and depressed mood: Secondary | ICD-10-CM

## 2024-03-12 MED ORDER — LINACLOTIDE 290 MCG PO CAPS: 290.0000 ug | ORAL_CAPSULE | Freq: Every day | ORAL | 3 refills | Status: DC

## 2024-03-12 MED ORDER — ZOLPIDEM TARTRATE 5 MG PO TABS: 5.0000 mg | ORAL_TABLET | Freq: Every evening | ORAL | 0 refills | Status: DC | PRN

## 2024-03-12 MED ORDER — FLUOXETINE HCL 20 MG PO TABS: 20.0000 mg | ORAL_TABLET | Freq: Every day | ORAL | 1 refills | Status: DC

## 2024-03-12 NOTE — Assessment & Plan Note (Addendum)
 Could be due to anxiety Avoid caffeinated products in the evening Sleep hygiene material provided Added Ambien  5 mg nightly, she has responded well to Ambien  in the past Vistaril  as needed for insomnia

## 2024-03-12 NOTE — Assessment & Plan Note (Signed)
 On Topiramate  50 mg twice daily - now improved Maxalt  PRN for migraine flare up

## 2024-03-12 NOTE — Assessment & Plan Note (Signed)
 Likely due to stress from recent loss of her fiance She is getting grief counseling currently through her workplace Has adequate emotional support currently DC Zoloft  due to persistent anhedonia Started Prozac 20 mg once daily Added Ambien  5 mg nightly for insomnia Has Hydroxyzine  50 mg PRN for insomnia as well She is worried about her finances as well, takes care of her 2 children Can try relaxation techniques

## 2024-03-12 NOTE — Progress Notes (Addendum)
 Established Patient Office Visit  Subjective:  Patient ID: Sarah Eaton, female    DOB: 06-Oct-1986  Age: 38 y.o. MRN: [id number]  CC:  Chief Complaint  Patient presents with   Medical Management of Chronic Issues    6 week f/u, pt reports insomnia, and depression medication not helping, needs linzess  medication change.     HPI Sarah Eaton is a 38 y.o. female with past medical history of migraine, obesity, Shingles and pyelonephritis who presents for f/u of her chronic medical conditions.  Grief reaction and adjustment disorder: She has been taking Zoloft  50 mg QD. She still feels depressed, insomnia and panic episodes at times.  She has crying spells.  She has insomnia despite taking hydroxyzine  50 mg nightly.  Her father has moved in with her for emotional support.  She also has a emotional support dog currently.  Denies any SI or HI currently. She lost her fianc about 2 months ago.  She had to give him chest compressions at home, which has led to flashbacks of it.  They used to work in the same office, and she has felt worse since returning to work in the last week.  She is getting grief counseling through her workplace.   Obesity: She has lost about 12 lbs since 10/24.  She is taking phentermine . She is following very specific low-carb diet from nutritionist service, and gets prepackaged food in it. Of note, she admits that she has been dealing with stress due to the recent loss of her fianc. She has tried Wegovy , but was very expensive she had to pay out-of-pocket.  She has tried Contrave  and Qsymia  without much benefit.  Migraine: She takes topiramate  50 mg twice daily. She had adequate relief with that dose of topiramate .  She also has Maxalt  as needed for breakthrough headache.  Past Medical History:  Diagnosis Date   Anemia    Hypertension    Obesity    Ovarian cyst    Pyelonephritis 05/04/2021   Sepsis due to Escherichia coli (E. coli) (HCC) 05/04/2021    Past  Surgical History:  Procedure Laterality Date   CESAREAN SECTION     CESAREAN SECTION WITH BILATERAL TUBAL LIGATION Bilateral 08/05/2013   Procedure: REPEAT CESAREAN SECTION WITH BILATERAL TUBAL LIGATION;  Surgeon: Harland JAYSON Birkenhead, MD;  Location: [facility];  Service: Obstetrics;  Laterality: Bilateral;   ENDOMETRIAL ABLATION W/ NOVASURE  09/2014   LAPAROSCOPIC VAGINAL HYSTERECTOMY WITH SALPINGECTOMY N/A 04/01/2022   Procedure: LAPAROSCOPIC ASSISTED VAGINAL HYSTERECTOMY WITH SALPINGECTOMY;  Surgeon: Connell Davies, MD;  Location: [facility];  Service: Gynecology;  Laterality: N/A;   TUBAL LIGATION      Family History  Problem Relation Age of Onset   Cancer Other        breast   Hypertension Mother    Cancer Mother        skin   Thyroid  disease Mother    Cancer Maternal Grandmother    Hypertension Maternal Grandmother    Thyroid  disease Father    Thyroid  disease Paternal Grandmother     Social History   Socioeconomic History   Marital status: Legally Separated    Spouse name: Not on file   Number of children: Not on file   Years of education: Not on file   Highest education level: Not on file  Occupational History   Not on file  Tobacco Use   Smoking status: Never   Smokeless tobacco: Never  Vaping Use   Vaping status:  Never Used  Substance and Sexual Activity   Alcohol use: Not Currently    Comment: occasionally   Drug use: Never   Sexual activity: Yes    Birth control/protection: Surgical  Other Topics Concern   Not on file  Social History Narrative   Live at home with children.     Social Drivers of Corporate investment banker Strain: Not on file  Food Insecurity: Not on file  Transportation Needs: Not on file  Physical Activity: Not on file  Stress: Not on file  Social Connections: Not on file  Intimate Partner Violence: Not on file    Outpatient Medications Prior to Visit  Medication Sig Dispense Refill   Estradiol  (IMVEXXY  MAINTENANCE PACK) 10 MCG INST Place 1  Insert vaginally 2 (two) times a week. 8 each 11   fluticasone  (FLONASE ) 50 MCG/ACT nasal spray Place 1 spray into both nostrils 2 (two) times daily. 16 g 2   hydrOXYzine  (VISTARIL ) 50 MG capsule Take 1 capsule (50 mg total) by mouth at bedtime as needed for anxiety (Insomnia). 90 capsule 1   phentermine  37.5 MG capsule TAKE 1 CAPSULE BY MOUTH EVERY MORNING 30 capsule 0   rizatriptan  (MAXALT ) 10 MG tablet Take 1 tablet (10 mg total) by mouth as needed for migraine. May repeat in 2 hours if needed 10 tablet 2   topiramate  (TOPAMAX ) 50 MG tablet Take 1 tablet (50 mg total) by mouth 2 (two) times daily. 180 tablet 1   valACYclovir  (VALTREX ) 1000 MG tablet TAKE 1 TABLET (1,000 MG TOTAL) BY MOUTH 2 (TWO) TIMES DAILY. TAKE FOR TEN DAYS. 20 tablet 3   LINZESS  145 MCG CAPS capsule TAKE 1 CAPSULE BY MOUTH DAILY BEFORE BREAKFAST. 30 capsule 5   sertraline  (ZOLOFT ) 25 MG tablet TAKE 1 TABLET (25 MG TOTAL) BY MOUTH DAILY. 90 tablet 2   Estradiol  Starter Pack (IMVEXXY  STARTER PACK) 10 MCG INST Place 1 Insert vaginally daily. Insert daily for 30 days, then decrease to twice weekly. 30 each 0   No facility-administered medications prior to visit.    Allergies  Allergen Reactions   Banana Anaphylaxis   Richelle [Fish Allergy] Anaphylaxis    Tuna fish only per pt    ROS Review of Systems  Constitutional:  Negative for chills and fever.  HENT:  Negative for congestion, sinus pressure, sinus pain and sore throat.   Eyes:  Negative for pain and discharge.  Respiratory:  Negative for cough and shortness of breath.   Cardiovascular:  Negative for chest pain and palpitations.  Gastrointestinal:  Positive for constipation. Negative for diarrhea, nausea and vomiting.  Endocrine: Negative for polydipsia and polyuria.  Genitourinary:  Negative for dysuria and hematuria.  Musculoskeletal:  Negative for neck pain and neck stiffness.  Skin:  Negative for rash.  Neurological:  Positive for headaches. Negative for  dizziness and weakness.  Psychiatric/Behavioral:  Positive for dysphoric mood and sleep disturbance. Negative for agitation and behavioral problems. The patient is nervous/anxious.       Objective:    Physical Exam Vitals reviewed.  Constitutional:      General: She is not in acute distress.    Appearance: She is not diaphoretic.  HENT:     Head: Normocephalic and atraumatic.     Nose: No congestion.     Mouth/Throat:     Mouth: Mucous membranes are moist.  Eyes:     General: No scleral icterus.    Extraocular Movements: Extraocular movements intact.  Cardiovascular:  Rate and Rhythm: Normal rate and regular rhythm.     Pulses: Normal pulses.     Heart sounds: Normal heart sounds. No murmur heard. Pulmonary:     Breath sounds: Normal breath sounds. No wheezing or rales.  Musculoskeletal:     Cervical back: Neck supple. No tenderness.     Right lower leg: No edema.     Left lower leg: No edema.  Skin:    General: Skin is warm.     Findings: No rash.  Neurological:     General: No focal deficit present.     Mental Status: She is alert and oriented to person, place, and time.     Sensory: No sensory deficit.     Motor: No weakness.  Psychiatric:        Mood and Affect: Mood is depressed. Affect is tearful.        Behavior: Behavior is cooperative.     BP 108/73   Pulse 82   Ht 5' 2 (1.575 m)   Wt 168 lb 6.4 oz (76.4 kg)   LMP 02/16/2022   SpO2 98%   BMI 30.80 kg/m  Wt Readings from Last 3 Encounters:  03/12/24 168 lb 6.4 oz (76.4 kg)  01/12/24 168 lb 6.4 oz (76.4 kg)  09/09/23 180 lb 3.2 oz (81.7 kg)    Lab Results  Component Value Date   TSH 1.090 10/03/2023   Lab Results  Component Value Date   WBC 7.3 10/03/2023   HGB 13.3 10/03/2023   HCT 41.3 10/03/2023   MCV 94 10/03/2023   PLT 326 10/03/2023   Lab Results  Component Value Date   NA 138 10/03/2023   K 4.7 10/03/2023   CO2 23 10/03/2023   GLUCOSE 76 10/03/2023   BUN 12 10/03/2023    CREATININE 0.87 10/03/2023   BILITOT 0.3 10/03/2023   ALKPHOS 63 10/03/2023   AST 13 10/03/2023   ALT 13 10/03/2023   PROT 6.6 10/03/2023   ALBUMIN 4.2 10/03/2023   CALCIUM 9.1 10/03/2023   ANIONGAP 6 06/19/2022   EGFR 89 10/03/2023   Lab Results  Component Value Date   CHOL 149 10/03/2023   Lab Results  Component Value Date   HDL 60 10/03/2023   Lab Results  Component Value Date   LDLCALC 79 10/03/2023   Lab Results  Component Value Date   TRIG 42 10/03/2023   Lab Results  Component Value Date   CHOLHDL 2.5 10/03/2023   Lab Results  Component Value Date   HGBA1C 5.0 10/03/2023      Assessment & Plan:   Problem List Items Addressed This Visit       Cardiovascular and Mediastinum   Migraine   On Topiramate  50 mg twice daily - now improved Maxalt  PRN for migraine flare up      Relevant Medications   FLUoxetine  (PROZAC ) 20 MG tablet     Digestive   Chronic idiopathic constipation   Has tried Colace, senna, MiraLAX  and Dulcolax in the past Had responded well to Linzess  now, but has recent worsening of constipation despite taking Linzess  regularly-increased dose of Linzess  to 290 mg QD Refer to GI due to persistent concern      Relevant Medications   linaclotide  (LINZESS ) 290 MCG CAPS capsule   Other Relevant Orders   Ambulatory referral to Gastroenterology     Other   Primary insomnia   Could be due to anxiety Avoid caffeinated products in the evening Sleep hygiene material provided  Added Ambien  5 mg nightly, she has responded well to Ambien  in the past Vistaril  as needed for insomnia      Relevant Medications   zolpidem  (AMBIEN ) 5 MG tablet   Adjustment disorder with mixed anxiety and depressed mood - Primary   Likely due to stress from recent loss of her fiance She is getting grief counseling currently through her workplace Has adequate emotional support currently DC Zoloft  due to persistent anhedonia Started Prozac  20 mg once daily Added  Ambien  5 mg nightly for insomnia Has Hydroxyzine  50 mg PRN for insomnia as well She is worried about her finances as well, takes care of her 2 children Can try relaxation techniques      Relevant Medications   FLUoxetine  (PROZAC ) 20 MG tablet      Meds ordered this encounter  Medications   FLUoxetine  (PROZAC ) 20 MG tablet    Sig: Take 1 tablet (20 mg total) by mouth daily.    Dispense:  90 tablet    Refill:  1   zolpidem  (AMBIEN ) 5 MG tablet    Sig: Take 1 tablet (5 mg total) by mouth at bedtime as needed for sleep.    Dispense:  30 tablet    Refill:  0   linaclotide  (LINZESS ) 290 MCG CAPS capsule    Sig: Take 1 capsule (290 mcg total) by mouth daily before breakfast.    Dispense:  30 capsule    Refill:  3    Follow-up: Return in about 2 months (around 05/12/2024) for MDD.    Suzzane MARLA Blanch, MD

## 2024-03-12 NOTE — Patient Instructions (Addendum)
 Please start taking Ambien  10 mg once daily.  Please start taking Prozac 20 mg once daily. Please stop taking Zoloft  now.  Please start taking Linzess  290 mg once daily.  Please continue to take at least 64 ounces of fluid intake in a day.

## 2024-03-12 NOTE — Assessment & Plan Note (Signed)
 Has tried Colace, senna, MiraLAX  and Dulcolax in the past Had responded well to Linzess  now, but has recent worsening of constipation despite taking Linzess  regularly-increased dose of Linzess  to 290 mg QD Refer to GI due to persistent concern

## 2024-03-24 ENCOUNTER — Other Ambulatory Visit: Payer: Self-pay | Admitting: Internal Medicine

## 2024-03-24 ENCOUNTER — Encounter: Payer: Self-pay | Admitting: Internal Medicine

## 2024-03-24 DIAGNOSIS — F411 Generalized anxiety disorder: Secondary | ICD-10-CM | POA: Insufficient documentation

## 2024-03-24 MED ORDER — BUSPIRONE HCL 7.5 MG PO TABS: 7.5000 mg | ORAL_TABLET | Freq: Two times a day (BID) | ORAL | 0 refills | Status: DC

## 2024-03-25 ENCOUNTER — Encounter (INDEPENDENT_AMBULATORY_CARE_PROVIDER_SITE_OTHER): Payer: Self-pay | Admitting: *Deleted

## 2024-03-31 ENCOUNTER — Other Ambulatory Visit: Payer: Self-pay

## 2024-03-31 ENCOUNTER — Other Ambulatory Visit: Payer: Self-pay | Admitting: Internal Medicine

## 2024-03-31 ENCOUNTER — Ambulatory Visit: Payer: Self-pay

## 2024-03-31 ENCOUNTER — Ambulatory Visit
Admission: RE | Admit: 2024-03-31 | Discharge: 2024-03-31 | Disposition: A | Discharge status: Home / Self Care | Payer: Self-pay | Source: Ambulatory Visit | Attending: Emergency Medicine | Admitting: Emergency Medicine

## 2024-03-31 VITALS — BP 131/96 | HR 100 | Temp 99.3°F | Resp 18 | Ht 62.0 in | Wt 165.0 lb

## 2024-03-31 DIAGNOSIS — H6502 Acute serous otitis media, left ear: Secondary | ICD-10-CM

## 2024-03-31 DIAGNOSIS — E66811 Obesity, class 1: Secondary | ICD-10-CM

## 2024-03-31 LAB — POCT RAPID STREP A (OFFICE): Rapid Strep A Screen: NEGATIVE

## 2024-03-31 MED ORDER — AMOXICILLIN-POT CLAVULANATE 875-125 MG PO TABS: 1.0000 | ORAL_TABLET | Freq: Two times a day (BID) | ORAL | 0 refills | Status: DC

## 2024-03-31 MED ORDER — BENZONATATE 100 MG PO CAPS: 100.0000 mg | ORAL_CAPSULE | Freq: Three times a day (TID) | ORAL | 0 refills | Status: DC

## 2024-03-31 MED ORDER — PROMETHAZINE-DM 6.25-15 MG/5ML PO SYRP: 5.0000 mL | ORAL_SOLUTION | Freq: Four times a day (QID) | ORAL | 0 refills | Status: DC | PRN

## 2024-03-31 NOTE — ED Triage Notes (Signed)
 Patient in office today complaint of sorethroat, ear pain (left) and cough with greenish mucus x3d  OTC: sudafed, mucinex   Denies: low grade fever , N/V

## 2024-03-31 NOTE — ED Provider Notes (Signed)
 Sarah Eaton    CSN: [id number] Arrival date & time: 03/31/24  1319      History   Chief Complaint Chief Complaint  Patient presents with   Cough    Cough, throat, pressure and body aches - Entered by patient    HPI Sarah Eaton is a 38 y.o. female.   Patient presents for evaluation of fever peaking at 101, nasal congestion, rhinorrhea, left-sided ear fullness and pain, shortness of breath at rest, wheezing and a sore throat present for 3 days.  No known sick contacts prior.  Has attempted use of Sudafed and Mucinex .  Tolerating food and liquids.  Past Medical History:  Diagnosis Date   Anemia    Hypertension    Obesity    Ovarian cyst    Pyelonephritis 05/04/2021   Sepsis due to Escherichia coli (E. coli) (HCC) 05/04/2021    Patient Active Problem List   Diagnosis Date Noted   GAD (generalized anxiety disorder) 03/24/2024   Grief reaction 01/12/2024   Encounter for general adult medical examination with abnormal findings 09/09/2023   Acute non-recurrent frontal sinusitis 09/09/2023   Insulin  resistance 11/20/2022   Adjustment disorder with mixed anxiety and depressed mood 11/20/2022   Primary insomnia 07/05/2022   Adenomyosis 05/16/2022   Left lower quadrant abdominal pain 01/02/2022   Chronic idiopathic constipation 01/02/2022   History of pyelonephritis 07/13/2021   GERD (gastroesophageal reflux disease) 05/04/2021   Migraine 05/04/2021   Obesity 10/20/2019    Past Surgical History:  Procedure Laterality Date   CESAREAN SECTION     CESAREAN SECTION WITH BILATERAL TUBAL LIGATION Bilateral 08/05/2013   Procedure: REPEAT CESAREAN SECTION WITH BILATERAL TUBAL LIGATION;  Surgeon: Ana Balling, MD;  Location: [facility];  Service: Obstetrics;  Laterality: Bilateral;   ENDOMETRIAL ABLATION W/ NOVASURE  09/2014   LAPAROSCOPIC VAGINAL HYSTERECTOMY WITH SALPINGECTOMY N/A 04/01/2022   Procedure: LAPAROSCOPIC ASSISTED VAGINAL HYSTERECTOMY WITH SALPINGECTOMY;   Surgeon: Teresa Fender, MD;  Location: [facility];  Service: Gynecology;  Laterality: N/A;   TUBAL LIGATION      OB History     Gravida  2   Para  2   Term  2   Preterm      AB      Living  2      SAB      IAB      Ectopic      Multiple      Live Births  2            Home Medications    Prior to Admission medications   Medication Sig Start Date End Date Taking? Authorizing Provider  amoxicillin -clavulanate (AUGMENTIN ) 875-125 MG tablet Take 1 tablet by mouth every 12 (twelve) hours. 03/31/24  Yes Hanley Woerner R, NP  benzonatate  (TESSALON ) 100 MG capsule Take 1 capsule (100 mg total) by mouth every 8 (eight) hours. 03/31/24  Yes Julen Rubert, Maybelle Spatz, NP  promethazine -dextromethorphan (PROMETHAZINE -DM) 6.25-15 MG/5ML syrup Take 5 mLs by mouth 4 (four) times daily as needed. 03/31/24  Yes Yelitza Reach R, NP  busPIRone  (BUSPAR ) 7.5 MG tablet Take 1 tablet (7.5 mg total) by mouth 2 (two) times daily. 03/24/24   Meldon Sport, MD  Estradiol  (IMVEXXY  MAINTENANCE PACK) 10 MCG INST Place 1 Insert vaginally 2 (two) times a week. 05/26/23   Teresa Fender, MD  FLUoxetine  (PROZAC ) 20 MG tablet Take 1 tablet (20 mg total) by mouth daily. 03/12/24   Meldon Sport, MD  fluticasone  (FLONASE )  50 MCG/ACT nasal spray Place 1 spray into both nostrils 2 (two) times daily. 09/05/23   Corbin Dess, PA-C  hydrOXYzine  (VISTARIL ) 50 MG capsule Take 1 capsule (50 mg total) by mouth at bedtime as needed for anxiety (Insomnia). 09/10/23   Meldon Sport, MD  linaclotide  (LINZESS ) 290 MCG CAPS capsule Take 1 capsule (290 mcg total) by mouth daily before breakfast. 03/12/24   Meldon Sport, MD  phentermine  37.5 MG capsule TAKE 1 CAPSULE BY MOUTH EVERY MORNING 02/23/24   Meldon Sport, MD  rizatriptan  (MAXALT ) 10 MG tablet Take 1 tablet (10 mg total) by mouth as needed for migraine. May repeat in 2 hours if needed 09/09/23   Meldon Sport, MD  topiramate  (TOPAMAX ) 50 MG tablet Take 1  tablet (50 mg total) by mouth 2 (two) times daily. 09/09/23   Meldon Sport, MD  valACYclovir  (VALTREX ) 1000 MG tablet TAKE 1 TABLET (1,000 MG TOTAL) BY MOUTH 2 (TWO) TIMES DAILY. TAKE FOR TEN DAYS. 09/18/23   Teresa Fender, MD  zolpidem  (AMBIEN ) 5 MG tablet Take 1 tablet (5 mg total) by mouth at bedtime as needed for sleep. 03/12/24   Meldon Sport, MD    Family History Family History  Problem Relation Age of Onset   Cancer Other        breast   Hypertension Mother    Cancer Mother        skin   Thyroid  disease Mother    Cancer Maternal Grandmother    Hypertension Maternal Grandmother    Thyroid  disease Father    Thyroid  disease Paternal Grandmother     Social History Social History   Tobacco Use   Smoking status: Never   Smokeless tobacco: Never  Vaping Use   Vaping status: Never Used  Substance Use Topics   Alcohol use: Not Currently    Comment: occasionally   Drug use: Never     Allergies   Banana and Tuna [fish allergy]   Review of Systems Review of Systems   Physical Exam Triage Vital Signs ED Triage Vitals  Encounter Vitals Group     BP 03/31/24 1341 (!) 131/96     Systolic BP Percentile --      Diastolic BP Percentile --      Pulse Rate 03/31/24 1341 100     Resp 03/31/24 1341 18     Temp 03/31/24 1341 99.3 F (37.4 C)     Temp Source 03/31/24 1341 Oral     SpO2 03/31/24 1341 97 %     Weight 03/31/24 1337 165 lb (74.8 kg)     Height 03/31/24 1337 5\' 2"  (1.575 m)     Head Circumference --      Peak Flow --      Pain Score 03/31/24 1336 9     Pain Loc --      Pain Education --      Exclude from Growth Chart --    No data found.  Updated Vital Signs BP (!) 131/96 (BP Location: Left Arm)   Pulse 100   Temp 99.3 F (37.4 C) (Oral)   Resp 18   Ht 5\' 2"  (1.575 m)   Wt 165 lb (74.8 kg)   LMP 02/16/2022   SpO2 97%   BMI 30.18 kg/m   Visual Acuity Right Eye Distance:   Left Eye Distance:   Bilateral Distance:    Right Eye Near:    Left Eye Near:    Bilateral  Near:     Physical Exam Constitutional:      Appearance: Normal appearance.  HENT:     Head: Normocephalic.     Right Ear: Tympanic membrane, ear canal and external ear normal.     Left Ear: Ear canal and external ear normal. Tympanic membrane is erythematous.     Nose: Congestion present. No rhinorrhea.     Mouth/Throat:     Pharynx: Posterior oropharyngeal erythema present. No oropharyngeal exudate.  Eyes:     Extraocular Movements: Extraocular movements intact.  Cardiovascular:     Rate and Rhythm: Normal rate and regular rhythm.     Pulses: Normal pulses.     Heart sounds: Normal heart sounds.  Pulmonary:     Effort: Pulmonary effort is normal.     Breath sounds: Normal breath sounds.  Musculoskeletal:     Cervical back: Normal range of motion and neck supple.  Skin:    General: Skin is warm and dry.  Neurological:     Mental Status: She is alert and oriented to person, place, and time. Mental status is at baseline.      UC Treatments / Results  Labs (all labs ordered are listed, but only abnormal results are displayed) Labs Reviewed  POCT RAPID STREP A (OFFICE) - Normal    EKG   Radiology No results found.  Procedures Procedures (including critical care time)  Medications Ordered in UC Medications - No data to display  Initial Impression / Assessment and Plan / UC Course  I have reviewed the triage vital signs and the nursing notes.  Pertinent labs & imaging results that were available during my care of the patient were reviewed by me and considered in my medical decision making (see chart for details).  Nonrecurrent acute serous otitis media of left ear  Erythema to the tympanic membrane is consistent with infection, congestion to the nasal turbinates otherwise stable exam, prescribed Augmentin .  For management of cough, prescribed Tessalon  and Promethazine  DM. Declined steroids ,advised against ear cleaning, may use  over-the-counter analgesics and warm compresses to the external ear for comfort, may follow-up if symptoms persist worsen or recur  Final Clinical Impressions(s) / UC Diagnoses   Final diagnoses:  Non-recurrent acute serous otitis media of left ear   Discharge Instructions      Today you are being treated for an infection of the eardrum  Take Augmentin  twice daily for 7 days, you should begin to see improvement after 48 hours of medication use and then it should progressively get better  May use Tessalon  pill every 8 hours as needed for cough, may use cough syrup every 6 hours but be mindful to make you drowsy  You may use Tylenol  or ibuprofen  for management of discomfort  May hold warm compresses to the ear for additional comfort  Please not attempted any ear cleaning or object or fluid placement into the ear canal to prevent further irritation   For cough: honey 1/2 to 1 teaspoon (you can dilute the honey in water or another fluid).  You can also use guaifenesin  and dextromethorphan for cough. You can use a humidifier for chest congestion and cough.  If you don't have a humidifier, you can sit in the bathroom with the hot shower running.      For sore throat: try warm salt water gargles, cepacol lozenges, throat spray, warm tea or water with lemon/honey, popsicles or ice, or OTC cold relief medicine for throat discomfort.   For congestion: take  a daily anti-histamine like Zyrtec, Claritin, and a oral decongestant, such as pseudoephedrine.  You can also use Flonase  1-2 sprays in each nostril daily.   It is important to stay hydrated: drink plenty of fluids (water, gatorade/powerade/pedialyte, juices, or teas) to keep your throat moisturized and help further relieve irritation/discomfort.    ED Prescriptions     Medication Sig Dispense Auth. Provider   amoxicillin -clavulanate (AUGMENTIN ) 875-125 MG tablet Take 1 tablet by mouth every 12 (twelve) hours. 14 tablet Callista Hoh R,  NP   benzonatate  (TESSALON ) 100 MG capsule Take 1 capsule (100 mg total) by mouth every 8 (eight) hours. 21 capsule Kelon Easom R, NP   promethazine -dextromethorphan (PROMETHAZINE -DM) 6.25-15 MG/5ML syrup Take 5 mLs by mouth 4 (four) times daily as needed. 118 mL Makendra Vigeant, Maybelle Spatz, NP      PDMP not reviewed this encounter.   Reena Canning, NP 03/31/24 1431

## 2024-03-31 NOTE — Discharge Instructions (Signed)
 Today you are being treated for an infection of the eardrum  Take Augmentin  twice daily for 7 days, you should begin to see improvement after 48 hours of medication use and then it should progressively get better  May use Tessalon  pill every 8 hours as needed for cough, may use cough syrup every 6 hours but be mindful to make you drowsy  You may use Tylenol  or ibuprofen  for management of discomfort  May hold warm compresses to the ear for additional comfort  Please not attempted any ear cleaning or object or fluid placement into the ear canal to prevent further irritation   For cough: honey 1/2 to 1 teaspoon (you can dilute the honey in water or another fluid).  You can also use guaifenesin  and dextromethorphan for cough. You can use a humidifier for chest congestion and cough.  If you don't have a humidifier, you can sit in the bathroom with the hot shower running.      For sore throat: try warm salt water gargles, cepacol lozenges, throat spray, warm tea or water with lemon/honey, popsicles or ice, or OTC cold relief medicine for throat discomfort.   For congestion: take a daily anti-histamine like Zyrtec, Claritin, and a oral decongestant, such as pseudoephedrine.  You can also use Flonase  1-2 sprays in each nostril daily.   It is important to stay hydrated: drink plenty of fluids (water, gatorade/powerade/pedialyte, juices, or teas) to keep your throat moisturized and help further relieve irritation/discomfort.

## 2024-04-01 ENCOUNTER — Other Ambulatory Visit: Payer: Self-pay | Admitting: Internal Medicine

## 2024-04-01 ENCOUNTER — Encounter: Payer: Self-pay | Admitting: Internal Medicine

## 2024-04-01 DIAGNOSIS — B3731 Acute candidiasis of vulva and vagina: Secondary | ICD-10-CM

## 2024-04-01 MED ORDER — FLUCONAZOLE 150 MG PO TABS: 150.0000 mg | ORAL_TABLET | Freq: Once | ORAL | 0 refills | Status: AC

## 2024-04-05 ENCOUNTER — Other Ambulatory Visit: Payer: Self-pay | Admitting: Internal Medicine

## 2024-04-05 DIAGNOSIS — B3731 Acute candidiasis of vulva and vagina: Secondary | ICD-10-CM

## 2024-04-05 MED ORDER — FLUCONAZOLE 150 MG PO TABS: 150.0000 mg | ORAL_TABLET | ORAL | 0 refills | Status: DC

## 2024-04-06 ENCOUNTER — Other Ambulatory Visit: Payer: Self-pay | Admitting: Internal Medicine

## 2024-04-06 DIAGNOSIS — F5101 Primary insomnia: Secondary | ICD-10-CM

## 2024-04-06 MED ORDER — ZOLPIDEM TARTRATE 10 MG PO TABS: 10.0000 mg | ORAL_TABLET | Freq: Every evening | ORAL | 3 refills | Status: DC | PRN

## 2024-04-19 ENCOUNTER — Other Ambulatory Visit: Payer: Self-pay | Admitting: Internal Medicine

## 2024-04-19 DIAGNOSIS — F411 Generalized anxiety disorder: Secondary | ICD-10-CM

## 2024-05-09 ENCOUNTER — Other Ambulatory Visit: Payer: Self-pay | Admitting: Internal Medicine

## 2024-05-09 DIAGNOSIS — Z6832 Body mass index (BMI) 32.0-32.9, adult: Secondary | ICD-10-CM

## 2024-05-18 ENCOUNTER — Encounter: Payer: Self-pay | Admitting: Internal Medicine

## 2024-05-18 ENCOUNTER — Ambulatory Visit: Admitting: Internal Medicine

## 2024-05-18 VITALS — BP 122/84 | HR 114 | Ht 62.0 in | Wt 153.6 lb

## 2024-05-18 DIAGNOSIS — E66811 Obesity, class 1: Secondary | ICD-10-CM

## 2024-05-18 DIAGNOSIS — F411 Generalized anxiety disorder: Secondary | ICD-10-CM | POA: Diagnosis not present

## 2024-05-18 DIAGNOSIS — F4323 Adjustment disorder with mixed anxiety and depressed mood: Secondary | ICD-10-CM | POA: Diagnosis not present

## 2024-05-18 DIAGNOSIS — Z6832 Body mass index (BMI) 32.0-32.9, adult: Secondary | ICD-10-CM

## 2024-05-18 DIAGNOSIS — G43009 Migraine without aura, not intractable, without status migrainosus: Secondary | ICD-10-CM

## 2024-05-18 DIAGNOSIS — F5101 Primary insomnia: Secondary | ICD-10-CM

## 2024-05-18 MED ORDER — BUSPIRONE HCL 10 MG PO TABS: 10.0000 mg | ORAL_TABLET | Freq: Two times a day (BID) | ORAL | 3 refills | Status: DC

## 2024-05-18 MED ORDER — FLUOXETINE HCL 20 MG PO TABS: 20.0000 mg | ORAL_TABLET | Freq: Every day | ORAL | 1 refills | Status: DC

## 2024-05-18 MED ORDER — PHENTERMINE HCL 37.5 MG PO TABS: 18.7500 mg | ORAL_TABLET | Freq: Every day | ORAL | 1 refills | Status: DC

## 2024-05-18 NOTE — Assessment & Plan Note (Signed)
 On Topiramate  50 mg twice daily - now improved Maxalt  PRN for migraine flare up

## 2024-05-18 NOTE — Progress Notes (Signed)
 Established Patient Office Visit  Subjective:  Patient ID: Sarah Eaton, female    DOB: [birth year]  Age: 38 y.o. MRN: [id number]  CC:  Chief Complaint  Patient presents with   Medical Management of Chronic Issues    2 month f/u.     HPI MARGURETTE Eaton is a 38 y.o. female with past medical history of migraine, obesity, Shingles and pyelonephritis who presents for f/u of her chronic medical conditions.  Grief reaction and adjustment disorder: She has been taking Prozac  20 mg QD.  She was feeling better till 2 weeks ago, has felt worse in this week. She has crying spells. She still feels anxious and has panic episodes at times. She does report improvement in her overall anxiety with BuSpar .   She has insomnia, but is better with Ambien  now.  Her father has moved in with her for emotional support.  She also has a emotional support dog currently.  Denies any SI or HI currently. She lost her fianc about 5 months ago.  She had to give him chest compressions at home, which has led to flashbacks of it.  They used to work in the same office, and she has felt worse since returning to work in the last week.  She is getting grief counseling through her workplace.  Obesity: She has lost about 15 lbs since 04/25.  She is taking phentermine . She is following very specific low-carb diet from nutritionist service, and gets prepackaged food in it. Of note, she admits that she has been dealing with stress due to the recent loss of her fianc. She has tried Wegovy , but was very expensive, she had to pay out-of-pocket.  She has tried Contrave  and Qsymia  without much benefit.  Migraine: She takes topiramate  50 mg twice daily. She had adequate relief with that dose of topiramate .  She also has Maxalt  as needed for breakthrough headache.  Past Medical History:  Diagnosis Date   Anemia    Hypertension    Obesity    Ovarian cyst    Pyelonephritis 05/04/2021   Sepsis due to Escherichia coli (E. coli) (HCC)  05/04/2021    Past Surgical History:  Procedure Laterality Date   CESAREAN SECTION     CESAREAN SECTION WITH BILATERAL TUBAL LIGATION Bilateral 08/05/2013   Procedure: REPEAT CESAREAN SECTION WITH BILATERAL TUBAL LIGATION;  Surgeon: Sarah Eaton;  Location: [facility];  Service: Obstetrics;  Laterality: Bilateral;   ENDOMETRIAL ABLATION W/ NOVASURE  09/2014   LAPAROSCOPIC VAGINAL HYSTERECTOMY WITH SALPINGECTOMY N/A 04/01/2022   Procedure: LAPAROSCOPIC ASSISTED VAGINAL HYSTERECTOMY WITH SALPINGECTOMY;  Surgeon: Sarah Eaton;  Location: [facility];  Service: Gynecology;  Laterality: N/A;   TUBAL LIGATION      Family History  Problem Relation Age of Onset   Cancer Other        breast   Hypertension Mother    Cancer Mother        skin   Thyroid  disease Mother    Cancer Maternal Grandmother    Hypertension Maternal Grandmother    Thyroid  disease Father    Thyroid  disease Paternal Grandmother     Social History   Socioeconomic History   Marital status: Legally Separated    Spouse name: Not on file   Number of children: Not on file   Years of education: Not on file   Highest education level: Not on file  Occupational History   Not on file  Tobacco Use   Smoking status: Never  Smokeless tobacco: Never  Vaping Use   Vaping status: Never Used  Substance and Sexual Activity   Alcohol use: Not Currently    Comment: occasionally   Drug use: Never   Sexual activity: Yes    Birth control/protection: Surgical  Other Topics Concern   Not on file  Social History Narrative   Live at home with children.     Social Drivers of Corporate investment banker Strain: Not on file  Food Insecurity: Not on file  Transportation Needs: Not on file  Physical Activity: Not on file  Stress: Not on file  Social Connections: Not on file  Intimate Partner Violence: Not on file    Outpatient Medications Prior to Visit  Medication Sig Dispense Refill   fluconazole  (DIFLUCAN ) 150 MG  tablet Take 1 tablet (150 mg total) by mouth every 3 (three) days. 3 tablet 0   fluticasone  (FLONASE ) 50 MCG/ACT nasal spray Place 1 spray into both nostrils 2 (two) times daily. 16 g 2   hydrOXYzine  (VISTARIL ) 50 MG capsule Take 1 capsule (50 mg total) by mouth at bedtime as needed for anxiety (Insomnia). 90 capsule 1   linaclotide  (LINZESS ) 290 MCG CAPS capsule Take 1 capsule (290 mcg total) by mouth daily before breakfast. 30 capsule 3   rizatriptan  (MAXALT ) 10 MG tablet Take 1 tablet (10 mg total) by mouth as needed for migraine. May repeat in 2 hours if needed 10 tablet 2   topiramate  (TOPAMAX ) 50 MG tablet Take 1 tablet (50 mg total) by mouth 2 (two) times daily. 180 tablet 1   valACYclovir  (VALTREX ) 1000 MG tablet TAKE 1 TABLET (1,000 MG TOTAL) BY MOUTH 2 (TWO) TIMES DAILY. TAKE FOR TEN DAYS. 20 tablet 3   zolpidem  (AMBIEN ) 10 MG tablet Take 1 tablet (10 mg total) by mouth at bedtime as needed for sleep. 30 tablet 3   amoxicillin -clavulanate (AUGMENTIN ) 875-125 MG tablet Take 1 tablet by mouth every 12 (twelve) hours. 14 tablet 0   benzonatate  (TESSALON ) 100 MG capsule Take 1 capsule (100 mg total) by mouth every 8 (eight) hours. 21 capsule 0   busPIRone  (BUSPAR ) 7.5 MG tablet TAKE 1 TABLET BY MOUTH 2 TIMES DAILY. 60 tablet 0   FLUoxetine  (PROZAC ) 20 MG tablet Take 1 tablet (20 mg total) by mouth daily. 90 tablet 1   phentermine  37.5 MG capsule TAKE 1 CAPSULE BY MOUTH EVERY DAY IN THE MORNING 30 capsule 0   promethazine -dextromethorphan (PROMETHAZINE -DM) 6.25-15 MG/5ML syrup Take 5 mLs by mouth 4 (four) times daily as needed. 118 mL 0   Estradiol  (IMVEXXY  MAINTENANCE PACK) 10 MCG INST Place 1 Insert vaginally 2 (two) times a week. 8 each 11   No facility-administered medications prior to visit.    Allergies  Allergen Reactions   Banana Anaphylaxis   Richelle [Fish Allergy] Anaphylaxis    Tuna fish only per pt    ROS Review of Systems  Constitutional:  Negative for chills and fever.   HENT:  Negative for congestion, sinus pressure, sinus pain and sore throat.   Eyes:  Negative for pain and discharge.  Respiratory:  Negative for cough and shortness of breath.   Cardiovascular:  Negative for chest pain and palpitations.  Gastrointestinal:  Positive for constipation. Negative for diarrhea, nausea and vomiting.  Endocrine: Negative for polydipsia and polyuria.  Genitourinary:  Negative for dysuria and hematuria.  Musculoskeletal:  Negative for neck pain and neck stiffness.  Skin:  Negative for rash.  Neurological:  Positive for headaches.  Negative for dizziness and weakness.  Psychiatric/Behavioral:  Positive for dysphoric mood and sleep disturbance. Negative for agitation and behavioral problems. The patient is nervous/anxious.       Objective:    Physical Exam Vitals reviewed.  Constitutional:      General: She is not in acute distress.    Appearance: She is not diaphoretic.  HENT:     Head: Normocephalic and atraumatic.     Nose: No congestion.     Mouth/Throat:     Mouth: Mucous membranes are moist.   Eyes:     General: No scleral icterus.    Extraocular Movements: Extraocular movements intact.    Cardiovascular:     Rate and Rhythm: Normal rate and regular rhythm.     Heart sounds: Normal heart sounds. No murmur heard. Pulmonary:     Breath sounds: Normal breath sounds. No wheezing or rales.   Musculoskeletal:     Cervical back: Neck supple. No tenderness.     Right lower leg: No edema.     Left lower leg: No edema.   Skin:    General: Skin is warm.     Findings: No rash.   Neurological:     General: No focal deficit present.     Mental Status: She is alert and oriented to person, place, and time.     Sensory: No sensory deficit.     Motor: No weakness.   Psychiatric:        Mood and Affect: Mood is anxious. Affect is tearful.        Behavior: Behavior is cooperative.        Thought Content: Thought content does not include homicidal or  suicidal ideation.     BP 122/84 (BP Location: Left Arm)   Pulse (!) 114   Ht 5' 2 (1.575 m)   Wt 153 lb 9.6 oz (69.7 kg)   LMP 02/16/2022   SpO2 98%   BMI 28.09 kg/m  Wt Readings from Last 3 Encounters:  05/18/24 153 lb 9.6 oz (69.7 kg)  03/31/24 165 lb (74.8 kg)  03/12/24 168 lb 6.4 oz (76.4 kg)    Lab Results  Component Value Date   TSH 1.090 10/03/2023   Lab Results  Component Value Date   WBC 7.3 10/03/2023   HGB 13.3 10/03/2023   HCT 41.3 10/03/2023   MCV 94 10/03/2023   PLT 326 10/03/2023   Lab Results  Component Value Date   NA 138 10/03/2023   K 4.7 10/03/2023   CO2 23 10/03/2023   GLUCOSE 76 10/03/2023   BUN 12 10/03/2023   CREATININE 0.87 10/03/2023   BILITOT 0.3 10/03/2023   ALKPHOS 63 10/03/2023   AST 13 10/03/2023   ALT 13 10/03/2023   PROT 6.6 10/03/2023   ALBUMIN 4.2 10/03/2023   CALCIUM 9.1 10/03/2023   ANIONGAP 6 06/19/2022   EGFR 89 10/03/2023   Lab Results  Component Value Date   CHOL 149 10/03/2023   Lab Results  Component Value Date   HDL 60 10/03/2023   Lab Results  Component Value Date   LDLCALC 79 10/03/2023   Lab Results  Component Value Date   TRIG 42 10/03/2023   Lab Results  Component Value Date   CHOLHDL 2.5 10/03/2023   Lab Results  Component Value Date   HGBA1C 5.0 10/03/2023      Assessment & Plan:   Problem List Items Addressed This Visit       Cardiovascular and Mediastinum  Migraine   On Topiramate  50 mg twice daily - now improved Maxalt  PRN for migraine flare up      Relevant Medications   FLUoxetine  (PROZAC ) 20 MG tablet     Other   Obesity   BMI Readings from Last 3 Encounters:  05/18/24 28.09 kg/m  03/31/24 30.18 kg/m  03/12/24 30.80 kg/m   She has been following low carb diet and portion control Exercises regularly BMI 31.17 when Wegovy  started, had lost 25 lbs with it, could not continue due to cost concern Has tried Qsymia  and Contrave  without much benefit On phentermine   37.5 mg QD again - has lost 15 lbs since the last visit Due to recent worsening of anxiety, decreased dose of phentermine  to 18.75 mg QD      Relevant Medications   phentermine  (ADIPEX-P ) 37.5 MG tablet   Primary insomnia   Adjustment disorder with mixed anxiety and depressed mood - Primary   Likely due to stress from recent loss of her fiance She is getting grief counseling currently through her workplace Has adequate emotional support currently On Prozac  20 mg once daily Increased dose of BuSpar  to 10 mg BID Added Ambien  5 mg nightly for insomnia Has Hydroxyzine  50 mg PRN for insomnia as well She is worried about her finances as well, takes care of her 2 children Can try relaxation techniques      Relevant Medications   FLUoxetine  (PROZAC ) 20 MG tablet   GAD (generalized anxiety disorder)   Anxiety symptoms worse in this week, increased dose of BuSpar  to 10 mg BID Simple relaxation techniques advised, can try yoga Continue grief counseling through workplace Work note provided for 3 days       Relevant Medications   busPIRone  (BUSPAR ) 10 MG tablet   FLUoxetine  (PROZAC ) 20 MG tablet       Meds ordered this encounter  Medications   phentermine  (ADIPEX-P ) 37.5 MG tablet    Sig: Take 0.5 tablets (18.75 mg total) by mouth daily before breakfast.    Dispense:  30 tablet    Refill:  1   busPIRone  (BUSPAR ) 10 MG tablet    Sig: Take 1 tablet (10 mg total) by mouth 2 (two) times daily.    Dispense:  60 tablet    Refill:  3   FLUoxetine  (PROZAC ) 20 MG tablet    Sig: Take 1 tablet (20 mg total) by mouth daily.    Dispense:  90 tablet    Refill:  1    Follow-up: Return in about 3 months (around 08/18/2024) for GAD and weight management.    Suzzane MARLA Blanch, Eaton

## 2024-05-18 NOTE — Assessment & Plan Note (Signed)
 BMI Readings from Last 3 Encounters:  05/18/24 28.09 kg/m  03/31/24 30.18 kg/m  03/12/24 30.80 kg/m   She has been following low carb diet and portion control Exercises regularly BMI 31.17 when Wegovy  started, had lost 25 lbs with it, could not continue due to cost concern Has tried Qsymia  and Contrave  without much benefit On phentermine  37.5 mg QD again - has lost 15 lbs since the last visit Due to recent worsening of anxiety, decreased dose of phentermine  to 18.75 mg QD

## 2024-05-18 NOTE — Patient Instructions (Signed)
 Please start taking Buspirone  10 mg twice daily. Continue Prozac  20 mg once daily.  Please start taking Phentermine  half tablet once daily for now.  Please continue to take medications as prescribed.  Please continue to follow low carb diet and perform moderate exercise/walking at least 150 mins/week.

## 2024-05-18 NOTE — Assessment & Plan Note (Signed)
 Anxiety symptoms worse in this week, increased dose of BuSpar  to 10 mg BID Simple relaxation techniques advised, can try yoga Continue grief counseling through workplace Work note provided for 3 days

## 2024-05-18 NOTE — Assessment & Plan Note (Signed)
 Likely due to stress from recent loss of her fiance She is getting grief counseling currently through her workplace Has adequate emotional support currently On Prozac  20 mg once daily Increased dose of BuSpar  to 10 mg BID Added Ambien  5 mg nightly for insomnia Has Hydroxyzine  50 mg PRN for insomnia as well She is worried about her finances as well, takes care of her 2 children Can try relaxation techniques

## 2024-05-25 ENCOUNTER — Other Ambulatory Visit: Payer: Self-pay | Admitting: Internal Medicine

## 2024-05-25 DIAGNOSIS — F411 Generalized anxiety disorder: Secondary | ICD-10-CM

## 2024-05-26 ENCOUNTER — Other Ambulatory Visit: Payer: Self-pay | Admitting: Internal Medicine

## 2024-05-26 DIAGNOSIS — B3731 Acute candidiasis of vulva and vagina: Secondary | ICD-10-CM

## 2024-05-28 ENCOUNTER — Encounter: Payer: Self-pay | Admitting: Internal Medicine

## 2024-05-29 ENCOUNTER — Encounter: Payer: Self-pay | Admitting: Nurse Practitioner

## 2024-05-29 ENCOUNTER — Telehealth: Admitting: Nurse Practitioner

## 2024-05-29 DIAGNOSIS — N760 Acute vaginitis: Secondary | ICD-10-CM

## 2024-05-29 DIAGNOSIS — B9689 Other specified bacterial agents as the cause of diseases classified elsewhere: Secondary | ICD-10-CM

## 2024-05-29 DIAGNOSIS — B3731 Acute candidiasis of vulva and vagina: Secondary | ICD-10-CM

## 2024-05-29 MED ORDER — FLUCONAZOLE 150 MG PO TABS
150.0000 mg | ORAL_TABLET | ORAL | 0 refills | Status: DC | PRN
Start: 2024-05-29 — End: 2024-07-27

## 2024-05-29 MED ORDER — METRONIDAZOLE 500 MG PO TABS: 500.0000 mg | ORAL_TABLET | Freq: Two times a day (BID) | ORAL | 0 refills | Status: AC

## 2024-05-29 NOTE — Patient Instructions (Signed)
 Sarah Eaton, thank you for joining Haze LELON Servant, NP for today's virtual visit.  While this provider is not your primary care provider (PCP), if your PCP is located in our provider database this encounter information will be shared with them immediately following your visit.   A [facility] MyChart account gives you access to today's visit and all your visits, tests, and labs performed at [facility]  click here if you don't have a [facility] MyChart account or go to [email or web address]  Consent: (Patient) Sarah Eaton provided verbal consent for this virtual visit at the beginning of the encounter.  Current Medications:  Current Outpatient Medications:    fluconazole  (DIFLUCAN ) 150 MG tablet, Take 1 tablet (150 mg total) by mouth every three (3) days as needed., Disp: 2 tablet, Rfl: 0   metroNIDAZOLE  (FLAGYL ) 500 MG tablet, Take 1 tablet (500 mg total) by mouth 2 (two) times daily for 7 days., Disp: 14 tablet, Rfl: 0   busPIRone  (BUSPAR ) 10 MG tablet, Take 1 tablet (10 mg total) by mouth 2 (two) times daily., Disp: 60 tablet, Rfl: 3   FLUoxetine  (PROZAC ) 20 MG tablet, Take 1 tablet (20 mg total) by mouth daily., Disp: 90 tablet, Rfl: 1   fluticasone  (FLONASE ) 50 MCG/ACT nasal spray, Place 1 spray into both nostrils 2 (two) times daily., Disp: 16 g, Rfl: 2   hydrOXYzine  (VISTARIL ) 50 MG capsule, Take 1 capsule (50 mg total) by mouth at bedtime as needed for anxiety (Insomnia)., Disp: 90 capsule, Rfl: 1   linaclotide  (LINZESS ) 290 MCG CAPS capsule, Take 1 capsule (290 mcg total) by mouth daily before breakfast., Disp: 30 capsule, Rfl: 3   phentermine  (ADIPEX-P ) 37.5 MG tablet, Take 0.5 tablets (18.75 mg total) by mouth daily before breakfast., Disp: 30 tablet, Rfl: 1   rizatriptan  (MAXALT ) 10 MG tablet, Take 1 tablet (10 mg total) by mouth as needed for migraine. May repeat in 2 hours if needed, Disp: 10 tablet, Rfl: 2   topiramate  (TOPAMAX ) 50 MG tablet, Take 1 tablet  (50 mg total) by mouth 2 (two) times daily., Disp: 180 tablet, Rfl: 1   valACYclovir  (VALTREX ) 1000 MG tablet, TAKE 1 TABLET (1,000 MG TOTAL) BY MOUTH 2 (TWO) TIMES DAILY. TAKE FOR TEN DAYS., Disp: 20 tablet, Rfl: 3   zolpidem  (AMBIEN ) 10 MG tablet, Take 1 tablet (10 mg total) by mouth at bedtime as needed for sleep., Disp: 30 tablet, Rfl: 3   Medications ordered in this encounter:  Meds ordered this encounter  Medications   metroNIDAZOLE  (FLAGYL ) 500 MG tablet    Sig: Take 1 tablet (500 mg total) by mouth 2 (two) times daily for 7 days.    Dispense:  14 tablet    Refill:  0    Supervising Provider:   LAMPTEY, PHILIP O [[id number]]   fluconazole  (DIFLUCAN ) 150 MG tablet    Sig: Take 1 tablet (150 mg total) by mouth every three (3) days as needed.    Dispense:  2 tablet    Refill:  0    Supervising Provider:   LAMPTEY, PHILIP O [[id number]]     *If you need refills on other medications prior to your next appointment, please contact your pharmacy*  Follow-Up: Call back or seek an in-person evaluation if the symptoms worsen or if the condition fails to improve as anticipated.  [facility] [phone]   If you have been instructed to have an in-person evaluation today at a local Urgent Care facility,  please use the link below. It will take you to a list of all of our available [facility], including address, phone number and hours of operation. Please do not delay care.  [facility]  If you or a family member do not have a primary care provider, use the link below to schedule a visit and establish care. When you choose a [facility] primary care physician or advanced practice provider, you gain a long-term partner in health. Find a Primary Care Provider  Learn more about [facility] in-office and virtual care options: [facility] - Get Care Now

## 2024-05-29 NOTE — Progress Notes (Signed)
 Virtual Visit Consent   Sarah Eaton, you are scheduled for a virtual visit with a [facility] provider today. Just as with appointments in the office, your consent must be obtained to participate. Your consent will be active for this visit and any virtual visit you may have with one of our providers in the next 365 days. If you have a MyChart account, a copy of this consent can be sent to you electronically.  As this is a virtual visit, video technology does not allow for your provider to perform a traditional examination. This may limit your provider's ability to fully assess your condition. If your provider identifies any concerns that need to be evaluated in person or the need to arrange testing (such as labs, EKG, etc.), we will make arrangements to do so. Although advances in technology are sophisticated, we cannot ensure that it will always work on either your end or our end. If the connection with a video visit is poor, the visit may have to be switched to a telephone visit. With either a video or telephone visit, we are not always able to ensure that we have a secure connection.  By engaging in this virtual visit, you consent to the provision of healthcare and authorize for your insurance to be billed (if applicable) for the services provided during this visit. Depending on your insurance coverage, you may receive a charge related to this service.  I need to obtain your verbal consent now. Are you willing to proceed with your visit today? Sarah Eaton has provided verbal consent on 05/29/2024 for a virtual visit (video or telephone). Sarah LELON Servant, NP  Date: 05/29/2024 9:14 AM   Virtual Visit via Video Note   I, Sarah Eaton, connected with  AVRI PAIVA  ([id number], 03/15/1986) on 05/29/24 at  9:00 AM EDT by a video-enabled telemedicine application and verified that I am speaking with the correct person using two identifiers.  Location: Patient: Virtual Visit Location Patient:  Home Provider: Virtual Visit Location Provider: Home Office   I discussed the limitations of evaluation and management by telemedicine and the availability of in person appointments. The patient expressed understanding and agreed to proceed.    History of Present Illness: Sarah Eaton is a 38 y.o. who identifies as a female who was assigned female at birth, and is being seen today for vaginitis.  Over the past several days Ms. Steptoe has been experiencing sensation of burning in the vagina, and soreness and vaginal itching.  She denies any malodorous discharge but states current symptoms feel similar to previous symptoms of BV.  Problems:  Patient Active Problem List   Diagnosis Date Noted   GAD (generalized anxiety disorder) 03/24/2024   Grief reaction 01/12/2024   Encounter for general adult medical examination with abnormal findings 09/09/2023   Acute non-recurrent frontal sinusitis 09/09/2023   Insulin  resistance 11/20/2022   Adjustment disorder with mixed anxiety and depressed mood 11/20/2022   Primary insomnia 07/05/2022   Adenomyosis 05/16/2022   Left lower quadrant abdominal pain 01/02/2022   Chronic idiopathic constipation 01/02/2022   History of pyelonephritis 07/13/2021   GERD (gastroesophageal reflux disease) 05/04/2021   Migraine 05/04/2021   Obesity 10/20/2019    Allergies:  Allergies  Allergen Reactions   Banana Anaphylaxis   Richelle [Fish Allergy] Anaphylaxis    Tuna fish only per pt   Medications:  Current Outpatient Medications:    fluconazole  (DIFLUCAN ) 150 MG tablet, Take 1 tablet (150 mg total)  by mouth every three (3) days as needed., Disp: 2 tablet, Rfl: 0   metroNIDAZOLE  (FLAGYL ) 500 MG tablet, Take 1 tablet (500 mg total) by mouth 2 (two) times daily for 7 days., Disp: 14 tablet, Rfl: 0   busPIRone  (BUSPAR ) 10 MG tablet, Take 1 tablet (10 mg total) by mouth 2 (two) times daily., Disp: 60 tablet, Rfl: 3   FLUoxetine  (PROZAC ) 20 MG tablet, Take 1 tablet  (20 mg total) by mouth daily., Disp: 90 tablet, Rfl: 1   fluticasone  (FLONASE ) 50 MCG/ACT nasal spray, Place 1 spray into both nostrils 2 (two) times daily., Disp: 16 g, Rfl: 2   hydrOXYzine  (VISTARIL ) 50 MG capsule, Take 1 capsule (50 mg total) by mouth at bedtime as needed for anxiety (Insomnia)., Disp: 90 capsule, Rfl: 1   linaclotide  (LINZESS ) 290 MCG CAPS capsule, Take 1 capsule (290 mcg total) by mouth daily before breakfast., Disp: 30 capsule, Rfl: 3   phentermine  (ADIPEX-P ) 37.5 MG tablet, Take 0.5 tablets (18.75 mg total) by mouth daily before breakfast., Disp: 30 tablet, Rfl: 1   rizatriptan  (MAXALT ) 10 MG tablet, Take 1 tablet (10 mg total) by mouth as needed for migraine. May repeat in 2 hours if needed, Disp: 10 tablet, Rfl: 2   topiramate  (TOPAMAX ) 50 MG tablet, Take 1 tablet (50 mg total) by mouth 2 (two) times daily., Disp: 180 tablet, Rfl: 1   valACYclovir  (VALTREX ) 1000 MG tablet, TAKE 1 TABLET (1,000 MG TOTAL) BY MOUTH 2 (TWO) TIMES DAILY. TAKE FOR TEN DAYS., Disp: 20 tablet, Rfl: 3   zolpidem  (AMBIEN ) 10 MG tablet, Take 1 tablet (10 mg total) by mouth at bedtime as needed for sleep., Disp: 30 tablet, Rfl: 3  Observations/Objective: Patient is well-developed, well-nourished in no acute distress.  Resting comfortably at home.  Head is normocephalic, atraumatic.  No labored breathing.  Speech is clear and coherent with logical content.  Patient is alert and oriented at baseline.    Assessment and Plan: 1. BV (bacterial vaginosis) (Primary) - metroNIDAZOLE  (FLAGYL ) 500 MG tablet; Take 1 tablet (500 mg total) by mouth 2 (two) times daily for 7 days.  Dispense: 14 tablet; Refill: 0  2. Yeast vaginitis - fluconazole  (DIFLUCAN ) 150 MG tablet; Take 1 tablet (150 mg total) by mouth every three (3) days as needed.  Dispense: 2 tablet; Refill: 0    Follow Up Instructions: I discussed the assessment and treatment plan with the patient. The patient was provided an opportunity to  ask questions and all were answered. The patient agreed with the plan and demonstrated an understanding of the instructions.  A copy of instructions were sent to the patient via MyChart unless otherwise noted below.    The patient was advised to call back or seek an in-person evaluation if the symptoms worsen or if the condition fails to improve as anticipated.    Billi Bright W Ingri Diemer, NP

## 2024-06-06 ENCOUNTER — Other Ambulatory Visit: Payer: Self-pay | Admitting: Internal Medicine

## 2024-06-06 DIAGNOSIS — G43009 Migraine without aura, not intractable, without status migrainosus: Secondary | ICD-10-CM

## 2024-06-16 ENCOUNTER — Other Ambulatory Visit: Payer: Self-pay | Admitting: Internal Medicine

## 2024-06-16 DIAGNOSIS — F411 Generalized anxiety disorder: Secondary | ICD-10-CM

## 2024-06-21 ENCOUNTER — Ambulatory Visit
Admission: RE | Admit: 2024-06-21 | Discharge: 2024-06-21 | Disposition: A | Discharge status: Home / Self Care | Attending: Emergency Medicine | Admitting: Emergency Medicine

## 2024-06-21 VITALS — BP 128/84 | HR 94 | Temp 98.0°F | Resp 18

## 2024-06-21 DIAGNOSIS — R509 Fever, unspecified: Secondary | ICD-10-CM

## 2024-06-21 DIAGNOSIS — J029 Acute pharyngitis, unspecified: Secondary | ICD-10-CM | POA: Diagnosis not present

## 2024-06-21 LAB — POCT RAPID STREP A (OFFICE): Rapid Strep A Screen: NEGATIVE

## 2024-06-21 LAB — POC SOFIA SARS ANTIGEN FIA: SARS Coronavirus 2 Ag: NEGATIVE

## 2024-06-21 MED ORDER — AMOXICILLIN-POT CLAVULANATE 875-125 MG PO TABS: 1.0000 | ORAL_TABLET | Freq: Two times a day (BID) | ORAL | 0 refills | Status: DC

## 2024-06-21 NOTE — Discharge Instructions (Addendum)
 The strep and COVID tests are negative.    Take the Augmentin  as directed.    Follow up with your primary care provider tomorrow.  Go to the emergency department if you have worsening symptoms.

## 2024-06-21 NOTE — ED Triage Notes (Signed)
 Patient to Urgent Care with complaints of sore throat/ fatigue/ fever/ nausea/ headaches.   Symptoms x2 weeks. Exposed to strep and covid.  Taking nyquil/ cough drops.

## 2024-06-21 NOTE — ED Provider Notes (Signed)
 [facility]    CSN: [id number] Arrival date & time: 06/21/24  1650      History   Chief Complaint Chief Complaint  Patient presents with   Fever    Fever body aches sore throat fatigue lethargic Upset stomach headache - Entered by patient    HPI Sarah Eaton is a 38 y.o. female.  Patient presents with 2-week history of fever, fatigue, headache, sore throat, nausea, vomiting, diarrhea.  Tmax 101.  Tylenol  taken early this morning.  No cough, shortness of breath, abdominal pain.  The history is provided by the patient and medical records.    Past Medical History:  Diagnosis Date   Anemia    Hypertension    Obesity    Ovarian cyst    Pyelonephritis 05/04/2021   Sepsis due to Escherichia coli (E. coli) (HCC) 05/04/2021    Patient Active Problem List   Diagnosis Date Noted   GAD (generalized anxiety disorder) 03/24/2024   Grief reaction 01/12/2024   Encounter for general adult medical examination with abnormal findings 09/09/2023   Acute non-recurrent frontal sinusitis 09/09/2023   Insulin  resistance 11/20/2022   Adjustment disorder with mixed anxiety and depressed mood 11/20/2022   Primary insomnia 07/05/2022   Adenomyosis 05/16/2022   Left lower quadrant abdominal pain 01/02/2022   Chronic idiopathic constipation 01/02/2022   History of pyelonephritis 07/13/2021   GERD (gastroesophageal reflux disease) 05/04/2021   Migraine 05/04/2021   Obesity 10/20/2019    Past Surgical History:  Procedure Laterality Date   CESAREAN SECTION     CESAREAN SECTION WITH BILATERAL TUBAL LIGATION Bilateral 08/05/2013   Procedure: REPEAT CESAREAN SECTION WITH BILATERAL TUBAL LIGATION;  Surgeon: Harland JAYSON Birkenhead, MD;  Location: [facility];  Service: Obstetrics;  Laterality: Bilateral;   ENDOMETRIAL ABLATION W/ NOVASURE  09/2014   LAPAROSCOPIC VAGINAL HYSTERECTOMY WITH SALPINGECTOMY N/A 04/01/2022   Procedure: LAPAROSCOPIC ASSISTED VAGINAL HYSTERECTOMY WITH SALPINGECTOMY;  Surgeon:  Connell Davies, MD;  Location: [facility] OR[facility];  Service: Gynecology;  Laterality: N/A;   TUBAL LIGATION      OB History     Gravida  2   Para  2   Term  2   Preterm      AB      Living  2      SAB      IAB      Ectopic      Multiple      Live Births  2            Home Medications    Prior to Admission medications   Medication Sig Start Date End Date Taking? Authorizing Provider  amoxicillin -clavulanate (AUGMENTIN ) 875-125 MG tablet Take 1 tablet by mouth every 12 (twelve) hours. 06/21/24  Yes Corlis Burnard DEL, NP  busPIRone  (BUSPAR ) 10 MG tablet TAKE 1 TABLET BY MOUTH TWICE A DAY 06/16/24   Tobie Suzzane POUR, MD  fluconazole  (DIFLUCAN ) 150 MG tablet Take 1 tablet (150 mg total) by mouth every three (3) days as needed. Patient not taking: Reported on 06/21/2024 05/29/24   Fleming, Zelda W, NP  FLUoxetine  (PROZAC ) 20 MG tablet Take 1 tablet (20 mg total) by mouth daily. 05/18/24   Tobie Suzzane POUR, MD  fluticasone  (FLONASE ) 50 MCG/ACT nasal spray Place 1 spray into both nostrils 2 (two) times daily. 09/05/23   Stuart Vernell Norris, PA-C  hydrOXYzine  (VISTARIL ) 50 MG capsule Take 1 capsule (50 mg total) by mouth at bedtime as needed for anxiety (Insomnia). 09/10/23   Tobie,  Suzzane POUR, MD  linaclotide  (LINZESS ) 290 MCG CAPS capsule Take 1 capsule (290 mcg total) by mouth daily before breakfast. 03/12/24   Tobie Suzzane POUR, MD  phentermine  (ADIPEX-P ) 37.5 MG tablet Take 0.5 tablets (18.75 mg total) by mouth daily before breakfast. 05/18/24   Tobie Suzzane POUR, MD  rizatriptan  (MAXALT ) 10 MG tablet TAKE 1 TABLET BY MOUTH AS NEEDED FOR MIGRAINE. MAY REPEAT IN 2 HOURS IF NEEDED 06/07/24   Tobie Suzzane POUR, MD  topiramate  (TOPAMAX ) 50 MG tablet Take 1 tablet (50 mg total) by mouth 2 (two) times daily. 09/09/23   Tobie Suzzane POUR, MD  valACYclovir  (VALTREX ) 1000 MG tablet TAKE 1 TABLET (1,000 MG TOTAL) BY MOUTH 2 (TWO) TIMES DAILY. TAKE FOR TEN DAYS. 09/18/23   Connell Davies, MD  zolpidem  (AMBIEN ) 10 MG  tablet Take 1 tablet (10 mg total) by mouth at bedtime as needed for sleep. 04/06/24   Tobie Suzzane POUR, MD    Family History Family History  Problem Relation Age of Onset   Cancer Other        breast   Hypertension Mother    Cancer Mother        skin   Thyroid  disease Mother    Cancer Maternal Grandmother    Hypertension Maternal Grandmother    Thyroid  disease Father    Thyroid  disease Paternal Grandmother     Social History Social History   Tobacco Use   Smoking status: Never   Smokeless tobacco: Never  Vaping Use   Vaping status: Never Used  Substance Use Topics   Alcohol use: Not Currently    Comment: occasionally   Drug use: Never     Allergies   Banana and Tuna [fish allergy]   Review of Systems Review of Systems  Constitutional:  Positive for fatigue and fever.  HENT:  Positive for sore throat. Negative for ear pain.   Respiratory:  Negative for cough and shortness of breath.   Gastrointestinal:  Positive for diarrhea, nausea and vomiting. Negative for abdominal pain.     Physical Exam Triage Vital Signs ED Triage Vitals  Encounter Vitals Group     BP 06/21/24 1716 128/84     Girls Systolic BP Percentile --      Girls Diastolic BP Percentile --      Boys Systolic BP Percentile --      Boys Diastolic BP Percentile --      Pulse Rate 06/21/24 1716 94     Resp 06/21/24 1716 18     Temp 06/21/24 1716 98 F (36.7 C)     Temp src --      SpO2 06/21/24 1716 97 %     Weight --      Height --      Head Circumference --      Peak Flow --      Pain Score 06/21/24 1723 7     Pain Loc --      Pain Education --      Exclude from Growth Chart --    No data found.  Updated Vital Signs BP 128/84   Pulse 94   Temp 98 F (36.7 C)   Resp 18   LMP 02/16/2022   SpO2 97%   Visual Acuity Right Eye Distance:   Left Eye Distance:   Bilateral Distance:    Right Eye Near:   Left Eye Near:    Bilateral Near:     Physical Exam Constitutional:  General: She is not in acute distress. HENT:     Right Ear: Tympanic membrane normal.     Left Ear: Tympanic membrane normal.     Nose: Nose normal.     Mouth/Throat:     Mouth: Mucous membranes are moist.     Pharynx: Posterior oropharyngeal erythema present.  Cardiovascular:     Rate and Rhythm: Normal rate and regular rhythm.     Heart sounds: Normal heart sounds.  Pulmonary:     Effort: Pulmonary effort is normal. No respiratory distress.     Breath sounds: Normal breath sounds.  Abdominal:     General: Bowel sounds are normal.     Palpations: Abdomen is soft.     Tenderness: There is no abdominal tenderness. There is no guarding or rebound.  Neurological:     Mental Status: She is alert.      UC Treatments / Results  Labs (all labs ordered are listed, but only abnormal results are displayed) Labs Reviewed  POCT RAPID STREP A (OFFICE)  POC SOFIA SARS ANTIGEN FIA    EKG   Radiology No results found.  Procedures Procedures (including critical care time)  Medications Ordered in UC Medications - No data to display  Initial Impression / Assessment and Plan / UC Course  I have reviewed the triage vital signs and the nursing notes.  Pertinent labs & imaging results that were available during my care of the patient were reviewed by me and considered in my medical decision making (see chart for details).   Fever, acute pharyngitis.  Afebrile and vital signs are stable.  Rapid strep negative.  Rapid COVID negative.  Patient has been symptomatic for 2 weeks.  Treating today with Augmentin .  Instructed patient to follow-up with her PCP tomorrow.  ED precautions given.  Education provided on fever.  She agrees to plan of care.  Final Clinical Impressions(s) / UC Diagnoses   Final diagnoses:  Fever, unspecified  Acute pharyngitis, unspecified etiology     Discharge Instructions      The strep and COVID tests are negative.    Take the Augmentin  as directed.     Follow up with your primary care provider tomorrow.  Go to the emergency department if you have worsening symptoms.        ED Prescriptions     Medication Sig Dispense Auth. Provider   amoxicillin -clavulanate (AUGMENTIN ) 875-125 MG tablet Take 1 tablet by mouth every 12 (twelve) hours. 14 tablet Corlis Burnard DEL, NP      PDMP not reviewed this encounter.   Corlis Burnard DEL, NP 06/21/24 1755

## 2024-06-30 ENCOUNTER — Encounter: Payer: Self-pay | Admitting: Internal Medicine

## 2024-06-30 ENCOUNTER — Other Ambulatory Visit: Payer: Self-pay | Admitting: Internal Medicine

## 2024-06-30 DIAGNOSIS — L659 Nonscarring hair loss, unspecified: Secondary | ICD-10-CM

## 2024-07-05 ENCOUNTER — Emergency Department (HOSPITAL_COMMUNITY)

## 2024-07-05 ENCOUNTER — Emergency Department (HOSPITAL_COMMUNITY)
Admission: EM | Admit: 2024-07-05 | Discharge: 2024-07-05 | Disposition: A | Discharge status: Home / Self Care | Source: Ambulatory Visit | Attending: Emergency Medicine | Admitting: Emergency Medicine

## 2024-07-05 ENCOUNTER — Ambulatory Visit: Admitting: Internal Medicine

## 2024-07-05 ENCOUNTER — Ambulatory Visit: Payer: Self-pay

## 2024-07-05 ENCOUNTER — Other Ambulatory Visit: Payer: Self-pay

## 2024-07-05 ENCOUNTER — Encounter: Payer: Self-pay | Admitting: Internal Medicine

## 2024-07-05 ENCOUNTER — Encounter (HOSPITAL_COMMUNITY): Payer: Self-pay | Admitting: Emergency Medicine

## 2024-07-05 DIAGNOSIS — K219 Gastro-esophageal reflux disease without esophagitis: Secondary | ICD-10-CM | POA: Insufficient documentation

## 2024-07-05 DIAGNOSIS — I10 Essential (primary) hypertension: Secondary | ICD-10-CM | POA: Insufficient documentation

## 2024-07-05 DIAGNOSIS — K920 Hematemesis: Secondary | ICD-10-CM | POA: Diagnosis present

## 2024-07-05 LAB — COMPREHENSIVE METABOLIC PANEL WITH GFR
ALT: 16 U/L (ref 0–44)
AST: 17 U/L (ref 15–41)
Albumin: 3.5 g/dL (ref 3.5–5.0)
Alkaline Phosphatase: 43 U/L (ref 38–126)
Anion gap: 7 (ref 5–15)
BUN: 18 mg/dL (ref 6–20)
CO2: 25 mmol/L (ref 22–32)
Calcium: 8.6 mg/dL — ABNORMAL LOW (ref 8.9–10.3)
Chloride: 105 mmol/L (ref 98–111)
Creatinine, Ser: 0.77 mg/dL (ref 0.44–1.00)
GFR, Estimated: >60 mL/min (ref >60)
Glucose, Bld: 75 mg/dL (ref 70–99)
Potassium: 3.7 mmol/L (ref 3.5–5.1)
Sodium: 137 mmol/L (ref 135–145)
Total Bilirubin: <0.2 mg/dL (ref 0.0–1.2)
Total Protein: 6.8 g/dL (ref 6.5–8.1)

## 2024-07-05 LAB — CBC WITH DIFFERENTIAL/PLATELET
Abs Immature Granulocytes: 0.03 K/uL (ref 0.00–0.07)
Basophils Absolute: 0.1 K/uL (ref 0.0–0.1)
Basophils Relative: 1 %
Eosinophils Absolute: 0.2 K/uL (ref 0.0–0.5)
Eosinophils Relative: 2 %
HCT: 38.2 % (ref 36.0–46.0)
Hemoglobin: 12.3 g/dL (ref 12.0–15.0)
Immature Granulocytes: 0 %
Lymphocytes Relative: 30 %
Lymphs Abs: 2.5 K/uL (ref 0.7–4.0)
MCH: 30.1 pg (ref 26.0–34.0)
MCHC: 32.2 g/dL (ref 30.0–36.0)
MCV: 93.6 fL (ref 80.0–100.0)
Monocytes Absolute: 0.4 K/uL (ref 0.1–1.0)
Monocytes Relative: 5 %
Neutro Abs: 5 K/uL (ref 1.7–7.7)
Neutrophils Relative %: 62 %
Platelets: 342 K/uL (ref 150–400)
RBC: 4.08 MIL/uL (ref 3.87–5.11)
RDW: 14.6 % (ref 11.5–15.5)
WBC: 8.2 K/uL (ref 4.0–10.5)
nRBC: 0 % (ref 0.0–0.2)

## 2024-07-05 LAB — TYPE AND SCREEN
ABO/RH(D): A POS
Antibody Screen: NEGATIVE

## 2024-07-05 LAB — PROTIME-INR
INR: 1 (ref 0.8–1.2)
Prothrombin Time: 13.6 s (ref 11.4–15.2)

## 2024-07-05 MED ORDER — PANTOPRAZOLE SODIUM 40 MG PO TBEC: 40.0000 mg | DELAYED_RELEASE_TABLET | Freq: Every day | ORAL | 0 refills | Status: DC

## 2024-07-05 MED ORDER — MORPHINE SULFATE (PF) 2 MG/ML IV SOLN
2.0000 mg | Freq: Once | INTRAVENOUS | Status: AC
  Administered 2024-07-05: 2 mg via INTRAVENOUS
  Filled 2024-07-05: qty 1

## 2024-07-05 MED ORDER — SUCRALFATE 1 G PO TABS
1.0000 g | ORAL_TABLET | Freq: Three times a day (TID) | ORAL | 0 refills | Status: DC
Start: 2024-07-05 — End: 2024-07-27

## 2024-07-05 MED ORDER — ONDANSETRON HCL 4 MG/2ML IJ SOLN
4.0000 mg | Freq: Once | INTRAMUSCULAR | Status: AC
  Administered 2024-07-05: 4 mg via INTRAVENOUS
  Filled 2024-07-05: qty 2

## 2024-07-05 MED ORDER — PANTOPRAZOLE SODIUM 40 MG IV SOLR
40.0000 mg | Freq: Once | INTRAVENOUS | Status: AC
  Administered 2024-07-05: 40 mg via INTRAVENOUS
  Filled 2024-07-05: qty 10

## 2024-07-05 NOTE — Discharge Instructions (Signed)
 As discussed your lab tests and exam today are reassuring, but you will need to avoid any medications that can worsen your symptoms, assuming your symptoms are associated with acid reflux disease, possibly peptic ulcer disease.  This would involve avoiding aspirin, NSAIDs which include ibuprofen (Advil , Motrin ) and naproxen, also avoid Goody powders and BC's which are very hard on the stomach.  You will need to take the medications prescribed.  Call Dr. Tonie office to arrange an office visit with him.  In the interim return here if you have any new or worsening symptoms including increased frequency of seeing blood either with vomiting or in your stools, or if you develop weakness or lightheadedness, return here immediately for reevaluation.  Your lab tests and exam today are reassuring.

## 2024-07-05 NOTE — Telephone Encounter (Signed)
 Copied from CRM #[id number]. Topic: Clinical - Red Word Triage >> Jul 05, 2024  7:59 AM Logan F wrote: Red Word that prompted transfer to Nurse Triage: Starting vomiting Saturday night for about 3-4 hours. Noticed there was blood in it. She says she can taste the blood when she coughs. She says she had a fever and chills. Chest is very sore. Says she has some pain towards her kidneys that comes and goes but pain is not too bad.

## 2024-07-05 NOTE — Telephone Encounter (Signed)
 FYI Only or Action Required?: FYI only for provider.  Patient was last seen in primary care on 05/29/2024 by Theotis Haze ORN, NP.  Called Nurse Triage reporting Cough.  Symptoms began several days ago.  Interventions attempted: Rest, hydration, or home remedies.  Symptoms are: unchanged.  Triage Disposition: Go to ED Now (Notify PCP)  Patient/caregiver understands and will follow disposition?: YesCopied from CRM #[id number]. Topic: Clinical - Red Word Triage >> Jul 05, 2024  7:59 AM Logan F wrote: Red Word that prompted transfer to Nurse Triage: Starting vomiting Saturday night for about 3-4 hours. Noticed there was blood in it. She says she can taste the blood when she coughs. She says she had a fever and chills. Chest is very sore. Says she has some pain towards her kidneys that comes and goes but pain is not too bad. Reason for Disposition  Vomited blood  (Exceptions: Few streaks that occurred only once, or swallowed blood from a nosebleed or cut in the mouth.)  Answer Assessment - Initial Assessment Questions Pt vomited up blood Saturday night numerous times. Pt sated lots of clots and raw blood. Pt is now coughing up blood. My chest hurts. I guess from all the coughing.  RN advised ED. Pt is taking service dog home then going to ED.       1. APPEARANCE of BLOOD: What does the blood look like? (e.g., pink, red blood, coffee-grounds)     Raw blood, clots 2. AMOUNT: How much blood was lost? (e.g., few streaks or strands, tablespoon, cup)     Not sure 3. VOMITING BLOOD: How many times did it happen? or How many times in the past 24 hours?     5-6 4. VOMITING WITHOUT BLOOD: How many times in the past 24 hours?      1-2 x 5. ONSET: When did vomiting of blood begin?     Saturday  6. CAUSE: What do you think is causing the vomiting of blood?     Not sure 7. BLOOD THINNERS: Do you take any blood thinners? (e.g., Coumadin/warfarin, Pradaxa/dabigatran, aspirin)      denies 8. DEHYDRATION: Are there any signs of dehydration? When was the last time you urinated? Do you feel dizzy?     This morning 9. ABDOMEN PAIN: Are you having any abdomen pain? If Yes, ask: What does it feel like? (e.g., crampy, dull, intermittent, constant)      denies 10. DIARRHEA: Is there any diarrhea? If Yes, ask: How many times today?        denies 11. OTHER SYMPTOMS: Do you have any other symptoms? (e.g., fever, blood in stool)       Chills, can taste blood when coughing. Lower back pain  Protocols used: Vomiting Blood-A-AH

## 2024-07-05 NOTE — ED Provider Notes (Signed)
 [facility] AT [facility] Provider Note   CSN: [id number] Arrival date & time: 07/05/24  9084     Patient presents with: Hematemesis   Sarah Eaton is a 38 y.o. female with a history including GERD, migraine headaches, anxiety, hypertension presenting for evaluation of nausea and hematemesis x 48 hours.  She has had about 6 episodes of emesis, describing seen small blood clots in her emesis since symptoms began.  She also endorses subjective fever along with left lower rib cage and upper left abdominal pain triggered by episodes of vomiting.  She denies shortness of breath, pain is worsened with emesis and palpation.  Denies shortness of breath, back pain, significant cough.  She does have a history of GERD, no known history of peptic ulcer disease.  She also had a headache several days this past week and has used [place] powders more than normal this past week.  No NSAID use, she took Tylenol  around 7 AM this morning, no current headache.  She denies bowel changes, no dark or bloody stools.   The history is provided by the patient.       Prior to Admission medications   Medication Sig Start Date End Date Taking? Authorizing Provider  pantoprazole  (PROTONIX ) 40 MG tablet Take 1 tablet (40 mg total) by mouth daily. 07/05/24  Yes Shaunta Oncale, PA-C  sucralfate  (CARAFATE ) 1 g tablet Take 1 tablet (1 g total) by mouth with breakfast, with lunch, and with evening meal. 07/05/24  Yes Lillyona Polasek, PA-C  amoxicillin -clavulanate (AUGMENTIN ) 875-125 MG tablet Take 1 tablet by mouth every 12 (twelve) hours. 06/21/24   Corlis Burnard DEL, NP  busPIRone  (BUSPAR ) 10 MG tablet TAKE 1 TABLET BY MOUTH TWICE A DAY 06/16/24   Tobie Suzzane POUR, MD  fluconazole  (DIFLUCAN ) 150 MG tablet Take 1 tablet (150 mg total) by mouth every three (3) days as needed. Patient not taking: Reported on 06/21/2024 05/29/24   Fleming, Zelda W, NP  FLUoxetine  (PROZAC ) 20 MG tablet Take 1 tablet (20 mg total) by  mouth daily. 05/18/24   Tobie Suzzane POUR, MD  fluticasone  (FLONASE ) 50 MCG/ACT nasal spray Place 1 spray into both nostrils 2 (two) times daily. 09/05/23   Stuart Vernell Norris, PA-C  hydrOXYzine  (VISTARIL ) 50 MG capsule Take 1 capsule (50 mg total) by mouth at bedtime as needed for anxiety (Insomnia). 09/10/23   Tobie Suzzane POUR, MD  linaclotide  (LINZESS ) 290 MCG CAPS capsule Take 1 capsule (290 mcg total) by mouth daily before breakfast. 03/12/24   Tobie Suzzane POUR, MD  phentermine  (ADIPEX-P ) 37.5 MG tablet Take 0.5 tablets (18.75 mg total) by mouth daily before breakfast. 05/18/24   Tobie Suzzane POUR, MD  rizatriptan  (MAXALT ) 10 MG tablet TAKE 1 TABLET BY MOUTH AS NEEDED FOR MIGRAINE. MAY REPEAT IN 2 HOURS IF NEEDED 06/07/24   Tobie Suzzane POUR, MD  topiramate  (TOPAMAX ) 50 MG tablet Take 1 tablet (50 mg total) by mouth 2 (two) times daily. 09/09/23   Tobie Suzzane POUR, MD  valACYclovir  (VALTREX ) 1000 MG tablet TAKE 1 TABLET (1,000 MG TOTAL) BY MOUTH 2 (TWO) TIMES DAILY. TAKE FOR TEN DAYS. 09/18/23   Connell Davies, MD  zolpidem  (AMBIEN ) 10 MG tablet Take 1 tablet (10 mg total) by mouth at bedtime as needed for sleep. 04/06/24   Tobie Suzzane POUR, MD    Allergies: Uvaldo and Richelle gums allergy]    Review of Systems  Constitutional:  Positive for fever.  HENT:  Negative for congestion and  sore throat.   Eyes: Negative.   Respiratory:  Negative for chest tightness and shortness of breath.   Cardiovascular:  Positive for chest pain.  Gastrointestinal:  Positive for abdominal pain, nausea and vomiting. Negative for blood in stool, constipation and diarrhea.  Genitourinary: Negative.   Musculoskeletal:  Negative for arthralgias, joint swelling and neck pain.  Skin: Negative.  Negative for rash and wound.  Neurological:  Negative for dizziness, weakness, light-headedness, numbness and headaches.  Psychiatric/Behavioral: Negative.      Updated Vital Signs BP 110/74 (BP Location: Right Arm)   Pulse 68   Temp  98.9 F (37.2 C) (Oral)   Resp 18   Ht 5' 2 (1.575 m)   Wt 72.6 kg   LMP 02/16/2022   SpO2 99%   BMI 29.26 kg/m   Physical Exam Vitals and nursing note reviewed.  Constitutional:      Appearance: She is well-developed.  HENT:     Head: Normocephalic and atraumatic.  Eyes:     Conjunctiva/sclera: Conjunctivae normal.  Cardiovascular:     Rate and Rhythm: Normal rate and regular rhythm.     Heart sounds: Normal heart sounds.  Pulmonary:     Effort: Pulmonary effort is normal.     Breath sounds: Normal breath sounds. No wheezing or rhonchi.  Chest:     Chest wall: Tenderness present.     Comments: Tenderness palpation left lower anterior ribs.  No palpable deformity, no crepitus. Abdominal:     General: Bowel sounds are normal.     Palpations: Abdomen is soft.     Tenderness: There is no abdominal tenderness. There is no guarding or rebound.     Comments:  No distention, guarding, no mass.  Normoactive bowel sounds  Musculoskeletal:        General: Normal range of motion.     Cervical back: Normal range of motion.  Skin:    General: Skin is warm and dry.  Neurological:     Mental Status: She is alert.     (all labs ordered are listed, but only abnormal results are displayed) Labs Reviewed  COMPREHENSIVE METABOLIC PANEL WITH GFR - Abnormal; Notable for the following components:      Result Value   Calcium 8.6 (*)    All other components within normal limits  CBC WITH DIFFERENTIAL/PLATELET  PROTIME-INR  TYPE AND SCREEN    EKG: EKG Interpretation Date/Time:  Monday July 05 2024 09:51:50 EDT Ventricular Rate:  80 PR Interval:  133 QRS Duration:  76 QT Interval:  382 QTC Calculation: 441 R Axis:   82  Text Interpretation: Sinus rhythm Baseline wander in lead(s) II III aVF Confirmed by Garrick Charleston [phone]) on 07/05/2024 9:55:38 AM  Radiology: DG Chest Portable 1 View Result Date: 07/05/2024 CLINICAL DATA:  sob EXAM: PORTABLE CHEST - 1 VIEW COMPARISON:   May 04, 2021 FINDINGS: No focal airspace consolidation, pleural effusion, or pneumothorax. No cardiomegaly.No acute fracture or destructive lesion. IMPRESSION: No acute cardiopulmonary abnormality. Electronically Signed   By: Rogelia Myers M.D.   On: 07/05/2024 10:39     Procedures   Medications Ordered in the ED  pantoprazole  (PROTONIX ) injection 40 mg (40 mg Intravenous Given 07/05/24 1015)  ondansetron  (ZOFRAN ) injection 4 mg (4 mg Intravenous Given 07/05/24 1016)  morphine  (PF) 2 MG/ML injection 2 mg (2 mg Intravenous Given 07/05/24 1016)  Medical Decision Making Patient presenting with a 2-day history of nausea and vomiting with visualized blood clots in her emesis.  She has had subjective fever, not currently.  Pain in her left lower rib cage, left upper abdomen although her abdomen is nontender on today's exam.  She does have a history of GERD raising concern for possible gastritis versus peptic ulcer disease.  She does endorse she has had increase use of [place] powders this past week.  No alcohol use, no history of liver disease, unlikely this represents variceal bleeding.  She has a benign abdominal exam.  Labs per below are all normal which is also reassuring.  She is not anemic.  No history suggesting lower GI bleed.  She was symptom-free at time of discharge after receiving Zofran  and Protonix .  She also received 2 mg of morphine  earlier in her ED stay.  Vital signs remained stable, she will be discharged home with strict return precautions, return here for any worsening symptoms, weakness, lightheadedness.  Referral given to GI for close follow-up care, she was also placed on Protonix  and Carafate  and advised no aspirin, Goody powder, NSAID use.  Amount and/or Complexity of Data Reviewed Labs: ordered.    Details: Labs reviewed including c-Met, CBC all normal range, specifically hemoglobin of 12.3 normal coags, normal LFTs.  BUN is 18. Radiology:  ordered.    Details: Chest x-ray negative for acute pneumonia or other cardiopulmonary disease. ECG/medicine tests: ordered.    Details: Normal sinus rhythm rate 80  Risk Prescription drug management.        Final diagnoses:  Hematemesis with nausea  Gastroesophageal reflux disease, unspecified whether esophagitis present    ED Discharge Orders          Ordered    pantoprazole  (PROTONIX ) 40 MG tablet  Daily        07/05/24 1143    sucralfate  (CARAFATE ) 1 g tablet  3 times daily with meals        07/05/24 1143               Collie, Kittel, PA-C 07/05/24 2050    Garrick Charleston, MD 07/06/24 1118

## 2024-07-05 NOTE — ED Triage Notes (Signed)
 Pt states she has been vomiting blood x2 days. Also c/o of left lower abd pain that started yesterday. Pt has had a fever x2days. Pt took tylenol  at 0700 this morning.

## 2024-07-14 ENCOUNTER — Encounter: Payer: Self-pay | Admitting: Internal Medicine

## 2024-07-21 ENCOUNTER — Ambulatory Visit

## 2024-07-21 DIAGNOSIS — L650 Telogen effluvium: Secondary | ICD-10-CM

## 2024-07-21 DIAGNOSIS — L649 Androgenic alopecia, unspecified: Secondary | ICD-10-CM | POA: Diagnosis not present

## 2024-07-21 MED ORDER — MINOXIDIL 2.5 MG PO TABS: 1.2500 mg | ORAL_TABLET | Freq: Every day | ORAL | 1 refills | Status: AC

## 2024-07-21 NOTE — Progress Notes (Signed)
   New Patient Visit   Subjective  Sarah Eaton is a 38 y.o. female who presents for the following: hair loss. Dur: 8-9 months, worsening. No hx of treatment. Takes multivitamins. Lost fiancee in January. No illness, surgeries, or hospitalizations.   No specific spots of hair loss on scalp, diffuse thinning per patient.  Under current oral Tx for depression and PTSD.  Has had partial hysterectomy.   The following portions of the chart were reviewed this encounter and updated as appropriate: medications, allergies, medical history  Review of Systems:  No other skin or systemic complaints except as noted in HPI or Assessment and Plan.  Objective  Well appearing patient in no apparent distress; mood and affect are within normal limits.  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 Condition is symptomatic and bothersome to patient. Patient is flaring and not currently at treatment goal.  - Explained to the patient that this represents temporary hair loss/shedding of resting/telogen hairs after some type of shock to the system such as illness, childbirth, new medication, surgery, in the case of this patient it is likely related to loss of fiance  - Discussed may have component of androgenetic alopecia, discussed the nature of this condition and that it will progress over time - Avoid over-vigorous combing, brushing and any type of scalp massage - Recent CBC/Diff, CMP wnl  - Check TSH  - Start Oral Minoxidil :       1.25mg  daily - can increase as tolerated        Risks: do not use in patients with poorly controlled hypertension, pulmonary hypertension. - Denies  - start topical Minoxidil  5% twice daily. Educated about proper  use, including ensuring the medication is applied directly to the scalp. Discusssed that minoxidil  can initially cause some hair shedding. Reviewed that expectation and goal of treatment is to prevent further loss, rather than hair regrowth.  Doses of oral minoxidil  for hair loss are considered 'low dose'. This is because the doses used for hair loss are much lower than the doses which are used for conditions such as high blood pressure (hypertension). The doses used for hypertension are 10-40mg  per day.  Side effects are uncommon at the low doses (up to 2.5 mg/day) used to treat hair loss. Potential side effects, more commonly seen at higher doses, include: Increase in hair growth (hypertrichosis) elsewhere on face and body Temporary hair shedding upon starting medication which may last up to 4 weeks Ankle swelling, fluid retention, rapid weight gain more than 5 pounds Low blood pressure and feeling lightheaded or dizzy when standing up quickly Fast or irregular heartbeat Headaches   TELOGEN EFFLUVIUM   Related Procedures Thyroid  Panel With TSH  Return in about 6 months (around 01/18/2025) for Alopecia Follow Up.  I, Jill Parcell, CMA, am acting as scribe for Lauraine JAYSON Kanaris, MD.   Documentation: I have reviewed the above documentation for accuracy and completeness, and I agree with the above.  Lauraine JAYSON Kanaris, MD

## 2024-07-21 NOTE — Patient Instructions (Addendum)
 Start minoxidil  2.5 mg 1/2 tablet daily  Doses of oral minoxidil  for hair loss are considered 'low dose'. This is because the doses used for hair loss are much lower than the doses which are used for conditions such as high blood pressure (hypertension). The doses used for hypertension are 10-40mg  per day.  Side effects are uncommon at the low doses (up to 2.5 mg/day) used to treat hair loss. Potential side effects, more commonly seen at higher doses, include: Increase in hair growth (hypertrichosis) elsewhere on face and body Temporary hair shedding upon starting medication which may last up to 4 weeks Ankle swelling, fluid retention, rapid weight gain more than 5 pounds Low blood pressure and feeling lightheaded or dizzy when standing up quickly Fast or irregular heartbeat Headaches  Recommend minoxidil  5% (Rogaine  for men) solution or foam to be applied to the scalp and left in. This should ideally be used twice daily for best results but it helps with hair regrowth when used at least three times per week. Rogaine  initially can cause increased hair shedding for the first few weeks but this will stop with continued use. In studies, people who used minoxidil  (Rogaine ) for at least 6 months had thicker hair than people who did not. Minoxidil  topical (Rogaine ) only works as long as it continues to be used. If if it is no longer used then the hair it has been helping to regrow can fall out. Minoxidil  topical (Rogaine ) can cause increased facial hair growth which can usually be managed easily with a battery-operated hair trimmer. If facial hair growth is bothersome, switching to the 2% women's version can decrease the risk of unwanted facial hair growth.    Telogen Effluvium Counseling Telogen effluvium is a benign, self-limited condition causing increased hair shedding usually for several months. It does not progress to baldness, and the hair eventually grows back on its own. It can be triggered by  recent illness, recent surgery, thyroid  disease, low iron stores, vitamin D  deficiency, fad diets or rapid weight loss, hormonal changes such as pregnancy or birth control pills, and some medication. Usually the hair loss starts 2-3 months after the illness or health change. Rarely, it can continue for longer than a year. Treatments options may include oral or topical Minoxidil ; Red Light scalp treatments; Biotin 2.5 mg daily and other options.     Due to recent changes in healthcare laws, you may see results of your pathology and/or laboratory studies on MyChart before the doctors have had a chance to review them. We understand that in some cases there may be results that are confusing or concerning to you. Please understand that not all results are received at the same time and often the doctors may need to interpret multiple results in order to provide you with the best plan of care or course of treatment. Therefore, we ask that you please give us  2 business days to thoroughly review all your results before contacting the office for clarification. Should we see a critical lab result, you will be contacted sooner.   If You Need Anything After Your Visit  If you have any questions or concerns for your doctor, please call our main line at [phone] and press option 4 to reach your doctor's medical assistant. If no one answers, please leave a voicemail as directed and we will return your call as soon as possible. Messages left after 4 pm will be answered the following business day.   You may also send us  a  message via MyChart. We typically respond to MyChart messages within 1-2 business days.  For prescription refills, please ask your pharmacy to contact our office. Our fax number is [phone].  If you have an urgent issue when the clinic is closed that cannot wait until the next business day, you can page your doctor at the number below.    Please note that while we do our best to be  available for urgent issues outside of office hours, we are not available 24/7.   If you have an urgent issue and are unable to reach us , you may choose to seek medical care at your doctor's office, retail clinic, urgent care center, or emergency room.  If you have a medical emergency, please immediately call 911 or go to the emergency department.  Pager Numbers  - Dr. Hester: [phone]  - Dr. Jackquline: [phone]  - Dr. Claudene: [phone]   - Dr. Raymund: [phone]  In the event of inclement weather, please call our main line at [phone] for an update on the status of any delays or closures.  Dermatology Medication Tips: Please keep the boxes that topical medications come in in order to help keep track of the instructions about where and how to use these. Pharmacies typically print the medication instructions only on the boxes and not directly on the medication tubes.   If your medication is too expensive, please contact our office at [phone] option 4 or send us  a message through MyChart.   We are unable to tell what your co-pay for medications will be in advance as this is different depending on your insurance coverage. However, we may be able to find a substitute medication at lower cost or fill out paperwork to get insurance to cover a needed medication.   If a prior authorization is required to get your medication covered by your insurance company, please allow us  1-2 business days to complete this process.  Drug prices often vary depending on where the prescription is filled and some pharmacies may offer cheaper prices.  The website www.goodrx.com contains coupons for medications through different pharmacies. The prices here do not account for what the cost may be with help from insurance (it may be cheaper with your insurance), but the website can give you the price if you did not use any insurance.  - You can print the associated coupon and take it with your  prescription to the pharmacy.  - You may also stop by our office during regular business hours and pick up a GoodRx coupon card.  - If you need your prescription sent electronically to a different pharmacy, notify our office through [facility] or by phone at [phone] option 4.     Si Usted Necesita Algo Despus de Su Visita  Tambin puede enviarnos un mensaje a travs de Clinical cytogeneticist. Por lo general respondemos a los mensajes de MyChart en el transcurso de 1 a 2 das hbiles.  Para renovar recetas, por favor pida a su farmacia que se ponga en contacto con nuestra oficina. Randi lakes de fax es [place] [phone].  Si tiene un asunto urgente cuando la clnica est cerrada y que no puede esperar hasta el siguiente da hbil, puede llamar/localizar a su doctor(a) al nmero que aparece a continuacin.   Por favor, tenga en cuenta que aunque hacemos todo lo posible para estar disponibles para asuntos urgentes fuera del horario de [place], no estamos disponibles las 24 horas del da, los 7 [address]  [address] de la [place].  Si tiene un problema urgente y no puede comunicarse con nosotros, puede optar por buscar atencin mdica  en el consultorio de su doctor(a), en una clnica privada, en un centro de atencin urgente o en una sala de emergencias.  Si tiene Engineer, drilling, por favor llame inmediatamente al 911 o vaya a la sala de emergencias.  Nmeros de bper  - Dr. Hester: [phone]  - Dra. Jackquline: [phone]  - Dr. Claudene: [phone]  - Dra. Kitts: [phone]  En caso de inclemencias del [place], por favor llame a nuestra lnea principal al [phone] para una actualizacin sobre el estado de cualquier retraso o cierre.  Consejos para la medicacin en dermatologa: Por favor, guarde las cajas en las que vienen los medicamentos de uso tpico para ayudarle a seguir las instrucciones sobre dnde y cmo usarlos. Las farmacias generalmente imprimen las instrucciones del  medicamento slo en las cajas y no directamente en los tubos del [place].   Si su medicamento es muy caro, por favor, pngase en contacto con landry rieger llamando al [phone] y presione la opcin 4 o envenos un mensaje a travs de Clinical cytogeneticist.   No podemos decirle cul ser su copago por los medicamentos por adelantado ya que esto es diferente dependiendo de la cobertura de su seguro. Sin embargo, es posible que podamos encontrar un medicamento sustituto a Audiological scientist un formulario para que el seguro cubra el medicamento que se considera necesario.   Si se requiere una autorizacin previa para que su compaa de seguros malta su medicamento, por favor permtanos de 1 a 2 das hbiles para completar este proceso.  Los precios de los medicamentos varan con frecuencia dependiendo del Environmental consultant de dnde se surte la receta y alguna farmacias pueden ofrecer precios ms baratos.  El sitio web www.goodrx.com tiene cupones para medicamentos de Health and safety inspector. Los precios aqu no tienen en cuenta lo que podra costar con la ayuda del seguro (puede ser ms barato con su seguro), pero el sitio web puede darle el precio si no utiliz Tourist information centre manager.  - Puede imprimir el cupn correspondiente y llevarlo con su receta a la farmacia.  - Tambin puede pasar por nuestra oficina durante el horario de atencin regular y Education officer, museum una tarjeta de cupones de GoodRx.  - Si necesita que su receta se enve electrnicamente a una farmacia diferente, informe a nuestra oficina a travs de MyChart de [place] o por telfono llamando al [phone] y presione la opcin 4.

## 2024-07-27 ENCOUNTER — Ambulatory Visit (INDEPENDENT_AMBULATORY_CARE_PROVIDER_SITE_OTHER): Admitting: Gastroenterology

## 2024-07-27 ENCOUNTER — Encounter (INDEPENDENT_AMBULATORY_CARE_PROVIDER_SITE_OTHER): Payer: Self-pay | Admitting: Gastroenterology

## 2024-07-27 VITALS — BP 121/84 | HR 83 | Temp 98.2°F | Ht 62.0 in | Wt 168.7 lb

## 2024-07-27 DIAGNOSIS — R1310 Dysphagia, unspecified: Secondary | ICD-10-CM | POA: Diagnosis not present

## 2024-07-27 DIAGNOSIS — K920 Hematemesis: Secondary | ICD-10-CM | POA: Diagnosis not present

## 2024-07-27 DIAGNOSIS — K5900 Constipation, unspecified: Secondary | ICD-10-CM

## 2024-07-27 DIAGNOSIS — K5904 Chronic idiopathic constipation: Secondary | ICD-10-CM

## 2024-07-27 MED ORDER — LUBIPROSTONE 24 MCG PO CAPS: 24.0000 ug | ORAL_CAPSULE | Freq: Two times a day (BID) | ORAL | 3 refills | Status: DC

## 2024-07-27 MED ORDER — PANTOPRAZOLE SODIUM 40 MG PO TBEC: 40.0000 mg | DELAYED_RELEASE_TABLET | Freq: Every day | ORAL | 1 refills | Status: DC

## 2024-07-27 MED ORDER — SUCRALFATE 1 G PO TABS: 1.0000 g | ORAL_TABLET | Freq: Three times a day (TID) | ORAL | 0 refills | Status: AC

## 2024-07-27 NOTE — H&P (View-Only) (Signed)
 Referring Provider: Tobie Suzzane POUR, MD Primary Care Physician:  Tobie Suzzane POUR, MD Primary GI Physician: new (Dr. Cinderella)   Chief Complaint  Patient presents with   Hematemesis    Patient here today due to having issues with vomiting blood. She says this was three weeks ago. She has not seen any recently. She is taking Pantoprazole  40 mg once per day and Sucralfate  1 Tid with meals. She also has issues with constipation and she takes linzess  290 mcg daily.   HPI:   Sarah Eaton is a 38 y.o. female with past medical history of anemia, HTN, ovarian cyst   Patient presenting today as a new patient for: Hematemesis, dysphagia  Constipation   Last labs 8/18 with hgb 12.3 INR 1 CMP unremarkable TSH in November 2024 1.09  States about 1 month ago she began vomiting blood which occurred throughout the course of a weekend. She reports some chest pain but no abdominal pain or nausea. She saw her PCP and was given some medication ( protonix  and carafate ) which seemed to help. She has history of chronic heartburn but better since starting protonix . She does note that food feels it sits in her throat or mid chest and she has to go throw the food up. This has improved some since being on carafate  and protonix . She denies any further episodes of vomiting, no coffee ground emesis. No diarrhea, she has history of chronic constipation, endorses some previous possibly black stools at time of hematemesis, but no BRBPR. Denies any new meds prior to onset, no regular use of NSAIDs though did use a few goody powders prior to these episodes. Does not drink or smoke.  She is on linzess  for constipation, she notes she can go 2-3 weeks without a BM, usually twice per week on linzess . Has had constipation for about 10 years since her last c section. She drinks a few bottles of water per day, drinks occasional sodas.   Last Colonoscopy: never  Last Endoscopy: never   [facility] Weights   07/27/24 0831   Weight: 168 lb 11.2 oz (76.5 kg)     Past Medical History:  Diagnosis Date   Anemia    Hypertension    Obesity    Ovarian cyst    Pyelonephritis 05/04/2021   Sepsis due to Escherichia coli (E. coli) (HCC) 05/04/2021    Past Surgical History:  Procedure Laterality Date   CESAREAN SECTION     CESAREAN SECTION WITH BILATERAL TUBAL LIGATION Bilateral 08/05/2013   Procedure: REPEAT CESAREAN SECTION WITH BILATERAL TUBAL LIGATION;  Surgeon: Harland JAYSON Birkenhead, MD;  Location: [facility];  Service: Obstetrics;  Laterality: Bilateral;   ENDOMETRIAL ABLATION W/ NOVASURE  09/2014   LAPAROSCOPIC VAGINAL HYSTERECTOMY WITH SALPINGECTOMY N/A 04/01/2022   Procedure: LAPAROSCOPIC ASSISTED VAGINAL HYSTERECTOMY WITH SALPINGECTOMY;  Surgeon: Connell Davies, MD;  Location: [facility] ORS;  Service: Gynecology;  Laterality: N/A;   TUBAL LIGATION      Current Outpatient Medications  Medication Sig Dispense Refill   busPIRone  (BUSPAR ) 10 MG tablet TAKE 1 TABLET BY MOUTH TWICE A DAY 180 tablet 2   FLUoxetine  (PROZAC ) 20 MG tablet Take 1 tablet (20 mg total) by mouth daily. 90 tablet 1   fluticasone  (FLONASE ) 50 MCG/ACT nasal spray Place 1 spray into both nostrils 2 (two) times daily. (Patient taking differently: Place 1 spray into both nostrils as needed.) 16 g 2   hydrOXYzine  (VISTARIL ) 50 MG capsule Take 1 capsule (50 mg total) by mouth at  bedtime as needed for anxiety (Insomnia). 90 capsule 1   linaclotide  (LINZESS ) 290 MCG CAPS capsule Take 1 capsule (290 mcg total) by mouth daily before breakfast. 30 capsule 3   minoxidil  (LONITEN ) 2.5 MG tablet Take 0.5 tablets (1.25 mg total) by mouth daily. Take half tablet (1.25 mg) daily 45 tablet 1   pantoprazole  (PROTONIX ) 40 MG tablet Take 1 tablet (40 mg total) by mouth daily. 30 tablet 0   phentermine  (ADIPEX-P ) 37.5 MG tablet Take 0.5 tablets (18.75 mg total) by mouth daily before breakfast. 30 tablet 1   rizatriptan  (MAXALT ) 10 MG tablet TAKE 1 TABLET BY MOUTH AS NEEDED FOR  MIGRAINE. MAY REPEAT IN 2 HOURS IF NEEDED 10 tablet 2   sucralfate  (CARAFATE ) 1 g tablet Take 1 tablet (1 g total) by mouth with breakfast, with lunch, and with evening meal. 90 tablet 0   topiramate  (TOPAMAX ) 50 MG tablet Take 1 tablet (50 mg total) by mouth 2 (two) times daily. 180 tablet 1   valACYclovir  (VALTREX ) 1000 MG tablet TAKE 1 TABLET (1,000 MG TOTAL) BY MOUTH 2 (TWO) TIMES DAILY. TAKE FOR TEN DAYS. (Patient taking differently: Take 1,000 mg by mouth 2 (two) times daily. Take for ten days. As needed per patient) 20 tablet 3   zolpidem  (AMBIEN ) 10 MG tablet Take 1 tablet (10 mg total) by mouth at bedtime as needed for sleep. 30 tablet 3   No current facility-administered medications for this visit.    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   Richelle gums allergy] Anaphylaxis 07/26/2016    Social History   Socioeconomic History   Marital status: Legally Separated    Spouse name: Not on file   Number of children: Not on file   Years of education: Not on file   Highest education level: Not on file  Occupational History   Not on file  Tobacco Use   Smoking status: Never   Smokeless tobacco: Never  Vaping Use   Vaping status: Never Used  Substance and Sexual Activity   Alcohol use: Not Currently    Comment: occasionally   Drug use: Never   Sexual activity: Yes    Birth control/protection: Surgical  Other Topics Concern   Not on file  Social History Narrative   Live at home with children.     Social Drivers of Corporate investment banker Strain: Not on file  Food Insecurity: Not on file  Transportation Needs: Not on file  Physical Activity: Not on file  Stress: Not on file  Social Connections: Not on file   Review of systems General: negative for malaise, night sweats, fever, chills, weight loss Neck: Negative for lumps, goiter, pain and significant neck swelling Resp: Negative for cough,  wheezing, dyspnea at rest CV: Negative for chest pain, leg swelling, palpitations, orthopnea GI: denies hematochezia, nausea, vomiting, diarrhea, dysphagia, odyonophagia, early satiety or unintentional weight loss. +hematemesis (resolved now) +dark stools (resolved now) +dysphagia +constipation  MSK: Negative for joint pain or swelling, back pain, and muscle pain. Derm: Negative for itching or rash Psych: Denies depression, anxiety, memory loss, confusion. No homicidal or suicidal ideation.  Heme: Negative for prolonged bleeding, bruising easily, and swollen nodes. Endocrine: Negative for cold or heat intolerance, polyuria, polydipsia and goiter. Neuro: negative for tremor, gait imbalance, syncope and seizures. The remainder of the review of systems is noncontributory.  Physical Exam: BP 121/84 (BP Location: Left Arm, Patient Position: Sitting,  Cuff Size: Large)   Pulse 83   Temp 98.2 F (36.8 C) (Temporal)   Ht 5' 2 (1.575 m)   Wt 168 lb 11.2 oz (76.5 kg)   LMP 02/16/2022   BMI 30.86 kg/m  General:   Alert and oriented. No distress noted. Pleasant and cooperative.  Head:  Normocephalic and atraumatic. Eyes:  Conjuctiva clear without scleral icterus. Mouth:  Oral mucosa pink and moist. Good dentition. No lesions. Heart: Normal rate and rhythm, s1 and s2 heart sounds present.  Lungs: Clear lung sounds in all lobes. Respirations equal and unlabored. Abdomen:  +BS, soft, non-tender and non-distended. No rebound or guarding. No HSM or masses noted. Derm: No palmar erythema or jaundice Msk:  Symmetrical without gross deformities. Normal posture. Extremities:  Without edema. Neurologic:  Alert and  oriented x4 Psych:  Alert and cooperative. Normal mood and affect.  Invalid input(s): 6 MONTHS   ASSESSMENT: Sarah Eaton is a 38 y.o. female presenting today as a new patient for hematemesis, dysphagia and chronic constipation  Hematemesis/dysphagia: -acute onset about 1 month ago  without nausea or abdominal pain -used a few goody powder over the course of a week or two prior to onset -endorses some possible dark stools at time of hematemesis but none now -resolved with PPI and carafate  -endorses some dysphagia and GERD prior to starting PPI and carafate , still with some dysphagia  Differentials include Mallory weiss tear, esophagitis, PUD, recommend EGD for further evaluation, will continue with PPI and carafate , should avoid all NSAIDs. Indications, risks and benefits of procedure discussed in detail with patient. Patient verbalized understanding and is in agreement to proceed with EGD  Chronic constipation: -Ongoing x10 years -Currently on linzess  290, having a BM about twice per week with need to strain or sit for long periods -No BRBPR or weight loss, denies abdominal pain  Will stop linzess  and start amitiza  24mcg BID. Should continue with good water intake, diet high in fruits, veggies and whole grains. Patient to make me aware if symptoms do not improve. Discussed she can expect some possible diarrhea and abdominal cramping at initiation of amitiza  which is normal and should taper off.    PLAN:  -stop linzess  -start amitiza  24mcg BID -schedule EGD +/- dilation, ASA I -continue protonix  40mg  daily -continue carafate  1g QID -continue to avoid NSAIDs   -Increase water intake, aim for atleast 64 oz per day -Increase fruits, veggies and whole grains, kiwi and prunes are especially good for constipation  All questions were answered, patient verbalized understanding and is in agreement with plan as outlined above.   Follow Up: 3 months   Evamae Rowen L. Lurine Imel, MSN, APRN, AGNP-C Adult-Gerontology Nurse Practitioner [facility]

## 2024-07-27 NOTE — Progress Notes (Signed)
 Referring Provider: Tobie Suzzane POUR, MD Primary Care Physician:  Tobie Suzzane POUR, MD Primary GI Physician: new (Dr. Cinderella)   Chief Complaint  Patient presents with   Hematemesis    Patient here today due to having issues with vomiting blood. She says this was three weeks ago. She has not seen any recently. She is taking Pantoprazole  40 mg once per day and Sucralfate  1 Tid with meals. She also has issues with constipation and she takes linzess  290 mcg daily.   HPI:   Sarah Eaton is a 38 y.o. female with past medical history of anemia, HTN, ovarian cyst   Patient presenting today as a new patient for: Hematemesis, dysphagia  Constipation   Last labs 8/18 with hgb 12.3 INR 1 CMP unremarkable TSH in November 2024 1.09  States about 1 month ago she began vomiting blood which occurred throughout the course of a weekend. She reports some chest pain but no abdominal pain or nausea. She saw her PCP and was given some medication ( protonix  and carafate ) which seemed to help. She has history of chronic heartburn but better since starting protonix . She does note that food feels it sits in her throat or mid chest and she has to go throw the food up. This has improved some since being on carafate  and protonix . She denies any further episodes of vomiting, no coffee ground emesis. No diarrhea, she has history of chronic constipation, endorses some previous possibly black stools at time of hematemesis, but no BRBPR. Denies any new meds prior to onset, no regular use of NSAIDs though did use a few goody powders prior to these episodes. Does not drink or smoke.  She is on linzess  for constipation, she notes she can go 2-3 weeks without a BM, usually twice per week on linzess . Has had constipation for about 10 years since her last c section. She drinks a few bottles of water per day, drinks occasional sodas.   Last Colonoscopy: never  Last Endoscopy: never   [facility] Weights   07/27/24 0831   Weight: 168 lb 11.2 oz (76.5 kg)     Past Medical History:  Diagnosis Date   Anemia    Hypertension    Obesity    Ovarian cyst    Pyelonephritis 05/04/2021   Sepsis due to Escherichia coli (E. coli) (HCC) 05/04/2021    Past Surgical History:  Procedure Laterality Date   CESAREAN SECTION     CESAREAN SECTION WITH BILATERAL TUBAL LIGATION Bilateral 08/05/2013   Procedure: REPEAT CESAREAN SECTION WITH BILATERAL TUBAL LIGATION;  Surgeon: Harland JAYSON Birkenhead, MD;  Location: [facility];  Service: Obstetrics;  Laterality: Bilateral;   ENDOMETRIAL ABLATION W/ NOVASURE  09/2014   LAPAROSCOPIC VAGINAL HYSTERECTOMY WITH SALPINGECTOMY N/A 04/01/2022   Procedure: LAPAROSCOPIC ASSISTED VAGINAL HYSTERECTOMY WITH SALPINGECTOMY;  Surgeon: Connell Davies, MD;  Location: [facility];  Service: Gynecology;  Laterality: N/A;   TUBAL LIGATION      Current Outpatient Medications  Medication Sig Dispense Refill   busPIRone  (BUSPAR ) 10 MG tablet TAKE 1 TABLET BY MOUTH TWICE A DAY 180 tablet 2   FLUoxetine  (PROZAC ) 20 MG tablet Take 1 tablet (20 mg total) by mouth daily. 90 tablet 1   fluticasone  (FLONASE ) 50 MCG/ACT nasal spray Place 1 spray into both nostrils 2 (two) times daily. (Patient taking differently: Place 1 spray into both nostrils as needed.) 16 g 2   hydrOXYzine  (VISTARIL ) 50 MG capsule Take 1 capsule (50 mg total) by mouth at  bedtime as needed for anxiety (Insomnia). 90 capsule 1   linaclotide  (LINZESS ) 290 MCG CAPS capsule Take 1 capsule (290 mcg total) by mouth daily before breakfast. 30 capsule 3   minoxidil  (LONITEN ) 2.5 MG tablet Take 0.5 tablets (1.25 mg total) by mouth daily. Take half tablet (1.25 mg) daily 45 tablet 1   pantoprazole  (PROTONIX ) 40 MG tablet Take 1 tablet (40 mg total) by mouth daily. 30 tablet 0   phentermine  (ADIPEX-P ) 37.5 MG tablet Take 0.5 tablets (18.75 mg total) by mouth daily before breakfast. 30 tablet 1   rizatriptan  (MAXALT ) 10 MG tablet TAKE 1 TABLET BY MOUTH AS NEEDED FOR  MIGRAINE. MAY REPEAT IN 2 HOURS IF NEEDED 10 tablet 2   sucralfate  (CARAFATE ) 1 g tablet Take 1 tablet (1 g total) by mouth with breakfast, with lunch, and with evening meal. 90 tablet 0   topiramate  (TOPAMAX ) 50 MG tablet Take 1 tablet (50 mg total) by mouth 2 (two) times daily. 180 tablet 1   valACYclovir  (VALTREX ) 1000 MG tablet TAKE 1 TABLET (1,000 MG TOTAL) BY MOUTH 2 (TWO) TIMES DAILY. TAKE FOR TEN DAYS. (Patient taking differently: Take 1,000 mg by mouth 2 (two) times daily. Take for ten days. As needed per patient) 20 tablet 3   zolpidem  (AMBIEN ) 10 MG tablet Take 1 tablet (10 mg total) by mouth at bedtime as needed for sleep. 30 tablet 3   No current facility-administered medications for this visit.    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   Richelle gums allergy] Anaphylaxis 07/26/2016    Social History   Socioeconomic History   Marital status: Legally Separated    Spouse name: Not on file   Number of children: Not on file   Years of education: Not on file   Highest education level: Not on file  Occupational History   Not on file  Tobacco Use   Smoking status: Never   Smokeless tobacco: Never  Vaping Use   Vaping status: Never Used  Substance and Sexual Activity   Alcohol use: Not Currently    Comment: occasionally   Drug use: Never   Sexual activity: Yes    Birth control/protection: Surgical  Other Topics Concern   Not on file  Social History Narrative   Live at home with children.     Social Drivers of Corporate investment banker Strain: Not on file  Food Insecurity: Not on file  Transportation Needs: Not on file  Physical Activity: Not on file  Stress: Not on file  Social Connections: Not on file   Review of systems General: negative for malaise, night sweats, fever, chills, weight loss Neck: Negative for lumps, goiter, pain and significant neck swelling Resp: Negative for cough,  wheezing, dyspnea at rest CV: Negative for chest pain, leg swelling, palpitations, orthopnea GI: denies hematochezia, nausea, vomiting, diarrhea, dysphagia, odyonophagia, early satiety or unintentional weight loss. +hematemesis (resolved now) +dark stools (resolved now) +dysphagia +constipation  MSK: Negative for joint pain or swelling, back pain, and muscle pain. Derm: Negative for itching or rash Psych: Denies depression, anxiety, memory loss, confusion. No homicidal or suicidal ideation.  Heme: Negative for prolonged bleeding, bruising easily, and swollen nodes. Endocrine: Negative for cold or heat intolerance, polyuria, polydipsia and goiter. Neuro: negative for tremor, gait imbalance, syncope and seizures. The remainder of the review of systems is noncontributory.  Physical Exam: BP 121/84 (BP Location: Left Arm, Patient Position: Sitting,  Cuff Size: Large)   Pulse 83   Temp 98.2 F (36.8 C) (Temporal)   Ht 5' 2 (1.575 m)   Wt 168 lb 11.2 oz (76.5 kg)   LMP 02/16/2022   BMI 30.86 kg/m  General:   Alert and oriented. No distress noted. Pleasant and cooperative.  Head:  Normocephalic and atraumatic. Eyes:  Conjuctiva clear without scleral icterus. Mouth:  Oral mucosa pink and moist. Good dentition. No lesions. Heart: Normal rate and rhythm, s1 and s2 heart sounds present.  Lungs: Clear lung sounds in all lobes. Respirations equal and unlabored. Abdomen:  +BS, soft, non-tender and non-distended. No rebound or guarding. No HSM or masses noted. Derm: No palmar erythema or jaundice Msk:  Symmetrical without gross deformities. Normal posture. Extremities:  Without edema. Neurologic:  Alert and  oriented x4 Psych:  Alert and cooperative. Normal mood and affect.  Invalid input(s): 6 MONTHS   ASSESSMENT: GEARLINE SPILMAN is a 38 y.o. female presenting today as a new patient for hematemesis, dysphagia and chronic constipation  Hematemesis/dysphagia: -acute onset about 1 month ago  without nausea or abdominal pain -used a few goody powder over the course of a week or two prior to onset -endorses some possible dark stools at time of hematemesis but none now -resolved with PPI and carafate  -endorses some dysphagia and GERD prior to starting PPI and carafate , still with some dysphagia  Differentials include Mallory weiss tear, esophagitis, PUD, recommend EGD for further evaluation, will continue with PPI and carafate , should avoid all NSAIDs. Indications, risks and benefits of procedure discussed in detail with patient. Patient verbalized understanding and is in agreement to proceed with EGD  Chronic constipation: -Ongoing x10 years -Currently on linzess  290, having a BM about twice per week with need to strain or sit for long periods -No BRBPR or weight loss, denies abdominal pain  Will stop linzess  and start amitiza  24mcg BID. Should continue with good water intake, diet high in fruits, veggies and whole grains. Patient to make me aware if symptoms do not improve. Discussed she can expect some possible diarrhea and abdominal cramping at initiation of amitiza  which is normal and should taper off.    PLAN:  -stop linzess  -start amitiza  24mcg BID -schedule EGD +/- dilation, ASA I -continue protonix  40mg  daily -continue carafate  1g QID -continue to avoid NSAIDs   -Increase water intake, aim for atleast 64 oz per day -Increase fruits, veggies and whole grains, kiwi and prunes are especially good for constipation  All questions were answered, patient verbalized understanding and is in agreement with plan as outlined above.   Follow Up: 3 months   Evamae Rowen L. Lurine Imel, MSN, APRN, AGNP-C Adult-Gerontology Nurse Practitioner [facility]

## 2024-07-27 NOTE — Patient Instructions (Signed)
-  stop linzess  -start amitiza  24mcg twice a day, for constipation -Increase water intake, aim for atleast 64 oz per day Increase fruits, veggies and whole grains, kiwi and prunes are especially good for constipation -schedule EGD -continue protonix  40mg  daily -continue carafate  1g before meals and at bedtime  -Please avoid NSAIDs (advil , aleve, naproxen, goody powder, ibuprofen ) as these can be very hard on your GI tract, causing inflammation, ulcers and damage to the lining of your GI tract.   Follow up 3 months  It was a pleasure to see you today. I want to create trusting relationships with patients and provide genuine, compassionate, and quality care. I truly value your feedback! please be on the lookout for a survey regarding your visit with me today. I appreciate your input about our visit and your time in completing this!    Brieanna Nau L. Samanvi Cuccia, MSN, APRN, AGNP-C Adult-Gerontology Nurse Practitioner [facility]

## 2024-07-28 DIAGNOSIS — N951 Menopausal and female climacteric states: Secondary | ICD-10-CM

## 2024-07-28 LAB — THYROID PANEL WITH TSH
Free Thyroxine Index: 1.6 (ref 1.2–4.9)
T3 Uptake Ratio: 26 % (ref 24–39)
T4, Total: 6.2 ug/dL (ref 4.5–12.0)
TSH: 1.27 u[IU]/mL (ref 0.450–4.500)

## 2024-07-30 ENCOUNTER — Encounter (INDEPENDENT_AMBULATORY_CARE_PROVIDER_SITE_OTHER): Payer: Self-pay

## 2024-08-02 ENCOUNTER — Ambulatory Visit: Payer: Self-pay

## 2024-08-02 NOTE — Telephone Encounter (Signed)
 Referral faxed to Dr. Darice Gaskins with [facility].   Fax: [phone]

## 2024-08-17 ENCOUNTER — Encounter (HOSPITAL_COMMUNITY): Payer: Self-pay

## 2024-08-17 ENCOUNTER — Other Ambulatory Visit: Payer: Self-pay

## 2024-08-17 ENCOUNTER — Encounter (HOSPITAL_COMMUNITY)
Admission: RE | Admit: 2024-08-17 | Discharge: 2024-08-17 | Disposition: A | Discharge status: Home / Self Care | Source: Ambulatory Visit | Attending: Gastroenterology | Admitting: Gastroenterology

## 2024-08-18 ENCOUNTER — Ambulatory Visit (INDEPENDENT_AMBULATORY_CARE_PROVIDER_SITE_OTHER): Admitting: Internal Medicine

## 2024-08-18 ENCOUNTER — Encounter: Payer: Self-pay | Admitting: Internal Medicine

## 2024-08-18 VITALS — BP 125/85 | HR 88 | Ht 62.0 in | Wt 175.0 lb

## 2024-08-18 DIAGNOSIS — F4323 Adjustment disorder with mixed anxiety and depressed mood: Secondary | ICD-10-CM

## 2024-08-18 DIAGNOSIS — K5904 Chronic idiopathic constipation: Secondary | ICD-10-CM | POA: Diagnosis not present

## 2024-08-18 DIAGNOSIS — K219 Gastro-esophageal reflux disease without esophagitis: Secondary | ICD-10-CM

## 2024-08-18 DIAGNOSIS — F411 Generalized anxiety disorder: Secondary | ICD-10-CM | POA: Diagnosis not present

## 2024-08-18 DIAGNOSIS — E782 Mixed hyperlipidemia: Secondary | ICD-10-CM

## 2024-08-18 DIAGNOSIS — E88819 Insulin resistance, unspecified: Secondary | ICD-10-CM

## 2024-08-18 DIAGNOSIS — E559 Vitamin D deficiency, unspecified: Secondary | ICD-10-CM

## 2024-08-18 DIAGNOSIS — Z9189 Other specified personal risk factors, not elsewhere classified: Secondary | ICD-10-CM | POA: Insufficient documentation

## 2024-08-18 DIAGNOSIS — Z23 Encounter for immunization: Secondary | ICD-10-CM | POA: Diagnosis not present

## 2024-08-18 DIAGNOSIS — G43009 Migraine without aura, not intractable, without status migrainosus: Secondary | ICD-10-CM

## 2024-08-18 DIAGNOSIS — F5101 Primary insomnia: Secondary | ICD-10-CM

## 2024-08-18 DIAGNOSIS — E538 Deficiency of other specified B group vitamins: Secondary | ICD-10-CM

## 2024-08-18 MED ORDER — BUPROPION HCL ER (SR) 150 MG PO TB12: 150.0000 mg | ORAL_TABLET | Freq: Two times a day (BID) | ORAL | 3 refills | Status: DC

## 2024-08-18 MED ORDER — TOPIRAMATE 50 MG PO TABS: 50.0000 mg | ORAL_TABLET | Freq: Two times a day (BID) | ORAL | 1 refills | Status: AC

## 2024-08-18 MED ORDER — ZOLPIDEM TARTRATE 10 MG PO TABS
10.0000 mg | ORAL_TABLET | Freq: Every evening | ORAL | 3 refills | Status: AC | PRN
Start: 2024-08-18 — End: ?

## 2024-08-18 NOTE — Assessment & Plan Note (Addendum)
 Has snoring, apneic episodes, daytime fatigue and hypersomnolence STOP-BANG: 3 At risk for OSA, especially considering recent weight gain Ordered home sleep study

## 2024-08-18 NOTE — Assessment & Plan Note (Signed)
 Has recent weight gain and history of prediabetes Had tolerated Wegovy  well, but was not covered by insurance Advised to continue to follow low-carb diet

## 2024-08-18 NOTE — Assessment & Plan Note (Signed)
 Could be due to anxiety Avoid caffeinated products in the evening Sleep hygiene material provided Takes Ambien  5 mg nightly, PDMP reviewed-refilled Vistaril  as needed for insomnia

## 2024-08-18 NOTE — Assessment & Plan Note (Signed)
 Has tried Colace, senna, MiraLAX  and Dulcolax in the past Had responded well to Linzess  now, but has recent worsening of constipation despite taking Linzess  regularly - on Amitiza  now Followed by GI now

## 2024-08-18 NOTE — Assessment & Plan Note (Signed)
 On pantoprazole  40 mg QD Followed by GI

## 2024-08-18 NOTE — Assessment & Plan Note (Signed)
 Uncontrolled with Prozac  20 mg QD Switched to Wellbutrin  150 mg BID Continue BuSpar  10 mg BID Simple relaxation techniques advised, can try yoga Continue grief counseling through workplace

## 2024-08-18 NOTE — Progress Notes (Signed)
 Established Patient Office Visit  Subjective:  Patient ID: Sarah Eaton, female    DOB: Oct 29, 1986  Age: 38 y.o. MRN: [id number]  CC:  Chief Complaint  Patient presents with   Anxiety    3 month f/u , would like to discuss medication for bupropion , states she has been taking medication from a friend.    Obesity    3 month f/u     HPI Sarah Eaton is a 38 y.o. female with past medical history of migraine, obesity, Shingles and pyelonephritis who presents for f/u of her chronic medical conditions.  Grief reaction and adjustment disorder: She has stopped taking Prozac  20 mg QD and has been taking Wellbutrin  instead from a friend.  She has felt better with Wellbutrin  compared to Prozac . She still feels anxious and has panic episodes at times. She does report improvement in her overall anxiety with BuSpar .   She has insomnia, but is better with Ambien  now.  Her father has moved in with her for emotional support.  She also has a emotional support dog currently.  Denies any SI or HI currently. She lost her fianc about 8 months ago.  She had to give him chest compressions at home, which has led to flashbacks of it.  They used to work in the same office. She had grief counseling through her workplace.  Obesity: She has gained about 22 lbs since 07/25.  She is taking phentermine  half tablet daily. She is following very specific low-carb diet from nutritionist service, and gets prepackaged food in it. Of note, she admits that she has been dealing with stress due to the recent loss of her fianc. She has tried Wegovy , but was very expensive, she had to pay out-of-pocket.  She has tried Contrave  and Qsymia  without much benefit.  Migraine: She is supposed to take topiramate  50 mg twice daily, but has not taken it recently. She had adequate relief with that dose of topiramate .  She also has Maxalt  as needed for breakthrough headache.  At risk for OSA: She reports snoring and her kids have noticed her  gasping for air at times.  She has daytime fatigue and hypersomnolence.  Past Medical History:  Diagnosis Date   Anemia    Hypertension    Obesity    Ovarian cyst    Pyelonephritis 05/04/2021   Sepsis due to Escherichia coli (E. coli) (HCC) 05/04/2021    Past Surgical History:  Procedure Laterality Date   CESAREAN SECTION     CESAREAN SECTION WITH BILATERAL TUBAL LIGATION Bilateral 08/05/2013   Procedure: REPEAT CESAREAN SECTION WITH BILATERAL TUBAL LIGATION;  Surgeon: Harland JAYSON Birkenhead, MD;  Location: [facility];  Service: Obstetrics;  Laterality: Bilateral;   ENDOMETRIAL ABLATION W/ NOVASURE  09/2014   LAPAROSCOPIC VAGINAL HYSTERECTOMY WITH SALPINGECTOMY N/A 04/01/2022   Procedure: LAPAROSCOPIC ASSISTED VAGINAL HYSTERECTOMY WITH SALPINGECTOMY;  Surgeon: Connell Davies, MD;  Location: [facility];  Service: Gynecology;  Laterality: N/A;   TUBAL LIGATION      Family History  Problem Relation Age of Onset   Cancer Other        breast   Hypertension Mother    Cancer Mother        skin   Thyroid  disease Mother    Cancer Maternal Grandmother    Hypertension Maternal Grandmother    Thyroid  disease Father    Thyroid  disease Paternal Grandmother     Social History   Socioeconomic History   Marital status: Legally Separated  Spouse name: Not on file   Number of children: Not on file   Years of education: Not on file   Highest education level: Not on file  Occupational History   Not on file  Tobacco Use   Smoking status: Never   Smokeless tobacco: Never  Vaping Use   Vaping status: Never Used  Substance and Sexual Activity   Alcohol use: Not Currently    Comment: occasionally   Drug use: Never   Sexual activity: Yes    Birth control/protection: Surgical  Other Topics Concern   Not on file  Social History Narrative   Live at home with children.     Social Drivers of Corporate investment banker Strain: Not on file  Food Insecurity: Not on file  Transportation Needs: Not on  file  Physical Activity: Not on file  Stress: Not on file  Social Connections: Not on file  Intimate Partner Violence: Not on file    Outpatient Medications Prior to Visit  Medication Sig Dispense Refill   busPIRone  (BUSPAR ) 10 MG tablet TAKE 1 TABLET BY MOUTH TWICE A DAY 180 tablet 2   fluticasone  (FLONASE ) 50 MCG/ACT nasal spray Place 1 spray into both nostrils 2 (two) times daily. (Patient taking differently: Place 1 spray into both nostrils as needed.) 16 g 2   hydrOXYzine  (VISTARIL ) 50 MG capsule Take 1 capsule (50 mg total) by mouth at bedtime as needed for anxiety (Insomnia). 90 capsule 1   lubiprostone  (AMITIZA ) 24 MCG capsule Take 1 capsule (24 mcg total) by mouth 2 (two) times daily with a meal. 60 capsule 3   minoxidil  (LONITEN ) 2.5 MG tablet Take 0.5 tablets (1.25 mg total) by mouth daily. Take half tablet (1.25 mg) daily 45 tablet 1   pantoprazole  (PROTONIX ) 40 MG tablet Take 1 tablet (40 mg total) by mouth daily. 90 tablet 1   rizatriptan  (MAXALT ) 10 MG tablet TAKE 1 TABLET BY MOUTH AS NEEDED FOR MIGRAINE. MAY REPEAT IN 2 HOURS IF NEEDED 10 tablet 2   sucralfate  (CARAFATE ) 1 g tablet Take 1 tablet (1 g total) by mouth 4 (four) times daily -  with meals and at bedtime for 21 days. 84 tablet 0   valACYclovir  (VALTREX ) 1000 MG tablet TAKE 1 TABLET (1,000 MG TOTAL) BY MOUTH 2 (TWO) TIMES DAILY. TAKE FOR TEN DAYS. (Patient taking differently: Take 1,000 mg by mouth 2 (two) times daily. Take for ten days. As needed per patient) 20 tablet 3   FLUoxetine  (PROZAC ) 20 MG tablet Take 1 tablet (20 mg total) by mouth daily. 90 tablet 1   phentermine  (ADIPEX-P ) 37.5 MG tablet Take 0.5 tablets (18.75 mg total) by mouth daily before breakfast. 30 tablet 1   topiramate  (TOPAMAX ) 50 MG tablet Take 1 tablet (50 mg total) by mouth 2 (two) times daily. 180 tablet 1   zolpidem  (AMBIEN ) 10 MG tablet Take 1 tablet (10 mg total) by mouth at bedtime as needed for sleep. 30 tablet 3   No  facility-administered medications prior to visit.    Allergies  Allergen Reactions   Banana Anaphylaxis   Richelle [Fish Allergy] Anaphylaxis    Tuna fish only per pt    ROS Review of Systems  Constitutional:  Negative for chills and fever.  HENT:  Negative for congestion, sinus pressure, sinus pain and sore throat.   Eyes:  Negative for pain and discharge.  Respiratory:  Negative for cough and shortness of breath.   Cardiovascular:  Negative for chest pain  and palpitations.  Gastrointestinal:  Positive for constipation. Negative for diarrhea, nausea and vomiting.  Endocrine: Negative for polydipsia and polyuria.  Genitourinary:  Negative for dysuria and hematuria.  Musculoskeletal:  Negative for neck pain and neck stiffness.  Skin:  Negative for rash.  Neurological:  Positive for headaches. Negative for dizziness and weakness.  Psychiatric/Behavioral:  Positive for dysphoric mood and sleep disturbance. Negative for agitation and behavioral problems. The patient is nervous/anxious.       Objective:    Physical Exam Vitals reviewed.  Constitutional:      General: She is not in acute distress.    Appearance: She is not diaphoretic.  HENT:     Head: Normocephalic and atraumatic.     Nose: No congestion.     Mouth/Throat:     Mouth: Mucous membranes are moist.  Eyes:     General: No scleral icterus.    Extraocular Movements: Extraocular movements intact.  Cardiovascular:     Rate and Rhythm: Normal rate and regular rhythm.     Heart sounds: Normal heart sounds. No murmur heard. Pulmonary:     Breath sounds: Normal breath sounds. No wheezing or rales.  Musculoskeletal:     Cervical back: Neck supple. No tenderness.     Right lower leg: No edema.     Left lower leg: No edema.  Skin:    General: Skin is warm.     Findings: No rash.  Neurological:     General: No focal deficit present.     Mental Status: She is alert and oriented to person, place, and time.     Sensory:  No sensory deficit.     Motor: No weakness.  Psychiatric:        Mood and Affect: Mood is anxious.        Behavior: Behavior is cooperative.        Thought Content: Thought content does not include homicidal or suicidal ideation.     BP 125/85   Pulse 88   Ht 5' 2 (1.575 m)   Wt 175 lb (79.4 kg)   LMP 02/16/2022   SpO2 98%   BMI 32.01 kg/m  Wt Readings from Last 3 Encounters:  08/18/24 175 lb (79.4 kg)  08/17/24 170 lb (77.1 kg)  07/27/24 168 lb 11.2 oz (76.5 kg)    Lab Results  Component Value Date   TSH 1.270 07/27/2024   Lab Results  Component Value Date   WBC 8.2 07/05/2024   HGB 12.3 07/05/2024   HCT 38.2 07/05/2024   MCV 93.6 07/05/2024   PLT 342 07/05/2024   Lab Results  Component Value Date   NA 137 07/05/2024   K 3.7 07/05/2024   CO2 25 07/05/2024   GLUCOSE 75 07/05/2024   BUN 18 07/05/2024   CREATININE 0.77 07/05/2024   BILITOT <0.2 07/05/2024   ALKPHOS 43 07/05/2024   AST 17 07/05/2024   ALT 16 07/05/2024   PROT 6.8 07/05/2024   ALBUMIN 3.5 07/05/2024   CALCIUM 8.6 (L) 07/05/2024   ANIONGAP 7 07/05/2024   EGFR 89 10/03/2023   Lab Results  Component Value Date   CHOL 149 10/03/2023   Lab Results  Component Value Date   HDL 60 10/03/2023   Lab Results  Component Value Date   LDLCALC 79 10/03/2023   Lab Results  Component Value Date   TRIG 42 10/03/2023   Lab Results  Component Value Date   CHOLHDL 2.5 10/03/2023   Lab Results  Component  Value Date   HGBA1C 5.0 10/03/2023      Assessment & Plan:   Problem List Items Addressed This Visit       Cardiovascular and Mediastinum   Migraine   Needs to restart topiramate  50 mg twice daily - migraine usually well-controlled with topiramate  Maxalt  PRN for migraine flare up      Relevant Medications   buPROPion  (WELLBUTRIN  SR) 150 MG 12 hr tablet   topiramate  (TOPAMAX ) 50 MG tablet   Other Relevant Orders   CMP14+EGFR   CBC with Differential/Platelet     Digestive   GERD  (gastroesophageal reflux disease)   On pantoprazole  40 mg QD Followed by GI      Chronic idiopathic constipation   Has tried Colace, senna, MiraLAX  and Dulcolax in the past Had responded well to Linzess  now, but has recent worsening of constipation despite taking Linzess  regularly - on Amitiza  now Followed by GI now        Endocrine   Insulin  resistance   Has recent weight gain and history of prediabetes Had tolerated Wegovy  well, but was not covered by insurance Advised to continue to follow low-carb diet      Relevant Orders   Hemoglobin A1c     Other   Primary insomnia   Could be due to anxiety Avoid caffeinated products in the evening Sleep hygiene material provided Takes Ambien  5 mg nightly, PDMP reviewed-refilled Vistaril  as needed for insomnia      Relevant Medications   zolpidem  (AMBIEN ) 10 MG tablet   Adjustment disorder with mixed anxiety and depressed mood   Likely due to stress from loss of her fiance She is getting grief counseling currently through her workplace Has adequate emotional support currently DC Prozac  as it was not effective Switched to Wellbutrin  150 mg BID On BuSpar  to 10 mg BID On Ambien  5 mg nightly for insomnia Has Hydroxyzine  50 mg PRN for insomnia as well She is worried about her finances as well, takes care of her 2 children Can try relaxation techniques      Relevant Medications   buPROPion  (WELLBUTRIN  SR) 150 MG 12 hr tablet   Other Relevant Orders   TSH + free T4   GAD (generalized anxiety disorder) - Primary   Uncontrolled with Prozac  20 mg QD Switched to Wellbutrin  150 mg BID Continue BuSpar  10 mg BID Simple relaxation techniques advised, can try yoga Continue grief counseling through workplace      Relevant Medications   buPROPion  (WELLBUTRIN  SR) 150 MG 12 hr tablet   Other Relevant Orders   TSH + free T4   At risk for sleep apnea   Has snoring, apneic episodes, daytime fatigue and hypersomnolence STOP-BANG: 3 At  risk for OSA, especially considering recent weight gain Ordered home sleep study      Relevant Orders   Home sleep test   Other Visit Diagnoses       Vitamin D  deficiency       Relevant Orders   VITAMIN D  25 Hydroxy (Vit-D Deficiency, Fractures)     Mixed hyperlipidemia       Relevant Orders   Lipid panel     B12 deficiency       Relevant Orders   B12     Encounter for immunization       Relevant Orders   Flu vaccine trivalent PF, 6mos and older(Flulaval,Afluria,Fluarix,Fluzone) (Completed)           Meds ordered this encounter  Medications   buPROPion  (  WELLBUTRIN  SR) 150 MG 12 hr tablet    Sig: Take 1 tablet (150 mg total) by mouth 2 (two) times daily.    Dispense:  60 tablet    Refill:  3   topiramate  (TOPAMAX ) 50 MG tablet    Sig: Take 1 tablet (50 mg total) by mouth 2 (two) times daily.    Dispense:  180 tablet    Refill:  1   zolpidem  (AMBIEN ) 10 MG tablet    Sig: Take 1 tablet (10 mg total) by mouth at bedtime as needed for sleep.    Dispense:  30 tablet    Refill:  3    Follow-up: Return in about 5 months (around 01/16/2025).    Suzzane MARLA Blanch, MD

## 2024-08-18 NOTE — Patient Instructions (Signed)
 Please start taking Wellbutrin  150 mg twice daily.  Please continue to take medications as prescribed.  Please continue to follow low carb diet and perform moderate exercise/walking at least 150 mins/week.  Please get fasting blood tests done before the next visit.

## 2024-08-18 NOTE — Assessment & Plan Note (Signed)
 Needs to restart topiramate  50 mg twice daily - migraine usually well-controlled with topiramate  Maxalt  PRN for migraine flare up

## 2024-08-18 NOTE — Assessment & Plan Note (Signed)
 Likely due to stress from loss of her fiance She is getting grief counseling currently through her workplace Has adequate emotional support currently DC Prozac  as it was not effective Switched to Wellbutrin  150 mg BID On BuSpar  to 10 mg BID On Ambien  5 mg nightly for insomnia Has Hydroxyzine  50 mg PRN for insomnia as well She is worried about her finances as well, takes care of her 2 children Can try relaxation techniques

## 2024-08-19 ENCOUNTER — Encounter (HOSPITAL_COMMUNITY)
Admission: RE | Disposition: A | Discharge status: Home / Self Care | Payer: Self-pay | Source: Home / Self Care | Attending: Gastroenterology

## 2024-08-19 ENCOUNTER — Encounter (HOSPITAL_COMMUNITY): Payer: Self-pay | Admitting: Gastroenterology

## 2024-08-19 ENCOUNTER — Ambulatory Visit (HOSPITAL_COMMUNITY): Admitting: Anesthesiology

## 2024-08-19 ENCOUNTER — Other Ambulatory Visit: Payer: Self-pay

## 2024-08-19 ENCOUNTER — Ambulatory Visit (HOSPITAL_COMMUNITY)
Admission: RE | Admit: 2024-08-19 | Discharge: 2024-08-19 | Disposition: A | Discharge status: Home / Self Care | Attending: Gastroenterology | Admitting: Gastroenterology

## 2024-08-19 ENCOUNTER — Other Ambulatory Visit: Payer: Self-pay | Admitting: Internal Medicine

## 2024-08-19 DIAGNOSIS — R1310 Dysphagia, unspecified: Secondary | ICD-10-CM | POA: Diagnosis present

## 2024-08-19 DIAGNOSIS — K222 Esophageal obstruction: Secondary | ICD-10-CM

## 2024-08-19 DIAGNOSIS — I10 Essential (primary) hypertension: Secondary | ICD-10-CM

## 2024-08-19 DIAGNOSIS — K920 Hematemesis: Secondary | ICD-10-CM | POA: Diagnosis not present

## 2024-08-19 DIAGNOSIS — K449 Diaphragmatic hernia without obstruction or gangrene: Secondary | ICD-10-CM | POA: Insufficient documentation

## 2024-08-19 DIAGNOSIS — K2970 Gastritis, unspecified, without bleeding: Secondary | ICD-10-CM | POA: Insufficient documentation

## 2024-08-19 DIAGNOSIS — K219 Gastro-esophageal reflux disease without esophagitis: Secondary | ICD-10-CM | POA: Diagnosis not present

## 2024-08-19 DIAGNOSIS — F419 Anxiety disorder, unspecified: Secondary | ICD-10-CM | POA: Diagnosis not present

## 2024-08-19 DIAGNOSIS — K200 Eosinophilic esophagitis: Secondary | ICD-10-CM | POA: Insufficient documentation

## 2024-08-19 DIAGNOSIS — F5101 Primary insomnia: Secondary | ICD-10-CM

## 2024-08-19 HISTORY — PX: ESOPHAGOGASTRODUODENOSCOPY: SHX5428

## 2024-08-19 SURGERY — EGD (ESOPHAGOGASTRODUODENOSCOPY)
Anesthesia: General

## 2024-08-19 MED ORDER — PROPOFOL 10 MG/ML IV BOLUS
INTRAVENOUS | Status: DC | PRN
  Administered 2024-08-19: 125 ug/kg/min via INTRAVENOUS
  Administered 2024-08-19: 100 mg via INTRAVENOUS
  Administered 2024-08-19: 40 mg via INTRAVENOUS

## 2024-08-19 MED ORDER — PANTOPRAZOLE SODIUM 40 MG PO TBEC: 40.0000 mg | DELAYED_RELEASE_TABLET | Freq: Two times a day (BID) | ORAL | 1 refills | Status: DC

## 2024-08-19 MED ORDER — LACTATED RINGERS IV SOLN: INTRAVENOUS | Status: DC | PRN

## 2024-08-19 MED ORDER — PANTOPRAZOLE SODIUM 40 MG PO TBEC: 40.0000 mg | DELAYED_RELEASE_TABLET | Freq: Two times a day (BID) | ORAL | 1 refills | Status: AC

## 2024-08-19 MED ORDER — LIDOCAINE 2% (20 MG/ML) 5 ML SYRINGE
INTRAMUSCULAR | Status: DC | PRN
Start: 2024-08-19 — End: 2024-08-19
  Administered 2024-08-19: 100 mg via INTRAVENOUS

## 2024-08-19 NOTE — Op Note (Signed)
 [facility] Patient Name: Sarah Eaton Procedure Date: 08/19/2024 10:50 AM MRN: [id number] Date of Birth: [birth year] Attending MD: Deatrice Dine , MD, [id number] CSN: [id number] Age: 38 Admit Type: Outpatient Procedure:                Upper GI endoscopy Indications:              Dysphagia, Hematemesis Providers:                Deatrice Dine, MD, Crystal Page, Bascom Blush Referring MD:              Medicines:                Monitored Anesthesia Care Complications:            No immediate complications. Estimated Blood Loss:     Estimated blood loss was minimal. Procedure:                Pre-Anesthesia Assessment:                           - Prior to the procedure, a History and Physical                            was performed, and patient medications and                            allergies were reviewed. The patient's tolerance of                            previous anesthesia was also reviewed. The risks                            and benefits of the procedure and the sedation                            options and risks were discussed with the patient.                            All questions were answered, and informed consent                            was obtained. Prior Anticoagulants: The patient has                            taken no anticoagulant or antiplatelet agents. ASA                            Grade Assessment: II - A patient with mild systemic                            disease. After reviewing the risks and benefits,                            the patient was deemed in satisfactory condition to  undergo the procedure.                           After obtaining informed consent, the endoscope was                            passed under direct vision. Throughout the                            procedure, the patient's blood pressure, pulse, and                            oxygen  saturations were monitored continuously. The                             [id number] ([id number]) scope was introduced through                            the mouth, and advanced to the second part of                            duodenum. The upper GI endoscopy was accomplished                            without difficulty. The patient tolerated the                            procedure well. Scope In: 11:12:04 AM Scope Out: 11:21:02 AM Total Procedure Duration: 0 hours 8 minutes 58 seconds  Findings:      Mucosal changes including ringed esophagus, feline appearance,       longitudinal furrows and small-caliber esophagus were found in the       middle third of the esophagus and in the lower third of the esophagus.       Esophageal findings were graded using the Eosinophilic Esophagitis       Endoscopic Reference Score (EoE-EREFS) as: Edema Grade 1 Present       (decreased clarity or absence of vascular markings), Rings Grade 2       Moderate (distinct rings that do not occlude passage of diagnostic 8-10       mm endoscope), Exudates Grade 1 Mild (scattered white lesions involving       less than 10 percent of the esophageal surface area), Furrows Grade 1       Mild (vertical lines without visible depth) and Stricture present.       Biopsies were obtained from the proximal and distal esophagus with cold       forceps for histology of suspected eosinophilic esophagitis. A TTS       dilator was passed through the scope. Dilation with a 15-16.5-18 mm       balloon dilator was performed to 18 mm. The dilation site was examined       following endoscope reinsertion and showed mild mucosal disruption.      A 2 cm hiatal hernia was present.      Mild inflammation characterized by erythema was found in the gastric       antrum. Biopsies were taken with a cold forceps  for histology.      The duodenal bulb and second portion of the duodenum were normal.       Biopsies were taken with a cold forceps for histology. Impression:               - Esophageal mucosal  changes suspicious for                            eosinophilic esophagitis. Dilated.                           - 2 cm hiatal hernia.                           - Gastritis. Biopsied.                           - Normal duodenal bulb and second portion of the                            duodenum. Biopsied.                           - Biopsies were taken with a cold forceps for                            evaluation of eosinophilic esophagitis. Moderate Sedation:      Per Anesthesia Care Recommendation:           - Patient has a contact number available for                            emergencies. The signs and symptoms of potential                            delayed complications were discussed with the                            patient. Return to normal activities tomorrow.                            Written discharge instructions were provided to the                            patient.                           - Resume previous diet.                           - Continue present medications.                           - Await pathology results.                           - Repeat upper endoscopy in 8 weeks for  surveillance based on pathology results.                           - Return to GI office as previously scheduled.                           -PPI BID Procedure Code(s):        --- Professional ---                           [phone], Esophagogastroduodenoscopy, flexible,                            transoral; with transendoscopic balloon dilation of                            esophagus (less than 30 mm diameter)                           43239, 59, Esophagogastroduodenoscopy, flexible,                            transoral; with biopsy, single or multiple Diagnosis Code(s):        --- Professional ---                           K22.89, Other specified disease of esophagus                           K44.9, Diaphragmatic hernia without obstruction or                             gangrene                           K29.70, Gastritis, unspecified, without bleeding                           R13.10, Dysphagia, unspecified                           K92.0, Hematemesis CPT copyright 2022 American Medical Association. All rights reserved. The codes documented in this report are preliminary and upon coder review may  be revised to meet current compliance requirements. Deatrice Dine, MD Deatrice Dine, MD 08/19/2024 11:29:11 AM This report has been signed electronically. Number of Addenda: 0

## 2024-08-19 NOTE — Transfer of Care (Addendum)
 Immediate Anesthesia Transfer of Care Note  Patient: TWALA COLLINGS  Procedure(s) Performed: EGD (ESOPHAGOGASTRODUODENOSCOPY)  Patient Location: Short Stay  Anesthesia Type:General  Level of Consciousness: drowsy and patient cooperative  Airway & Oxygen  Therapy: Patient Spontanous Breathing  Post-op Assessment: Report given to RN and Post -op Vital signs reviewed and stable  Post vital signs: Reviewed and stable  Last Vitals:  Vitals Value Taken Time  BP 97/55 08/19/24   1126  Temp 36.7 08/19/24   1126  Pulse 70 08/19/24   1126  Resp 18 08/19/24   1126  SpO2 100% 08/19/24   1126    Last Pain:  Vitals:   08/19/24 1107  TempSrc:   PainSc: 0-No pain      Patients Stated Pain Goal: 5 (08/19/24 1002)  Complications: No notable events documented.

## 2024-08-19 NOTE — Anesthesia Preprocedure Evaluation (Signed)
 Anesthesia Evaluation  Patient identified by MRN, date of birth, ID band Patient awake    Reviewed: Allergy & Precautions, H&P , NPO status , Patient's Chart, lab work & pertinent test results, reviewed documented beta blocker date and time   Airway Mallampati: II  TM Distance: >3 FB Neck ROM: full    Dental no notable dental hx. (+) Dental Advisory Given, Teeth Intact   Pulmonary neg pulmonary ROS   Pulmonary exam normal breath sounds clear to auscultation       Cardiovascular Exercise Tolerance: Good hypertension, negative cardio ROS Normal cardiovascular exam Rhythm:regular Rate:Normal     Neuro/Psych  Headaches PSYCHIATRIC DISORDERS Anxiety        GI/Hepatic negative GI ROS, Neg liver ROS,GERD  ,,  Endo/Other  negative endocrine ROS    Renal/GU negative Renal ROS  negative genitourinary   Musculoskeletal   Abdominal   Peds  Hematology negative hematology ROS (+)   Anesthesia Other Findings History e coli sepsis  Reproductive/Obstetrics negative OB ROS                              Anesthesia Physical Anesthesia Plan  ASA: 2  Anesthesia Plan: General   Post-op Pain Management: Minimal or no pain anticipated   Induction: Intravenous  PONV Risk Score and Plan: Propofol  infusion  Airway Management Planned: Natural Airway and Nasal Cannula  Additional Equipment: None  Intra-op Plan:   Post-operative Plan:   Informed Consent: I have reviewed the patients History and Physical, chart, labs and discussed the procedure including the risks, benefits and alternatives for the proposed anesthesia with the patient or authorized representative who has indicated his/her understanding and acceptance.     Dental Advisory Given  Plan Discussed with: CRNA  Anesthesia Plan Comments:          Anesthesia Quick Evaluation

## 2024-08-19 NOTE — Anesthesia Procedure Notes (Signed)
 Date/Time: 08/19/2024 11:08 AM  Performed by: Para Jerelene CROME, CRNAOxygen Delivery Method: Simple face mask Comments: POM Face Mask.

## 2024-08-19 NOTE — Discharge Instructions (Signed)
  Discharge instructions Please read the instructions outlined below and refer to this sheet in the next few weeks. These discharge instructions provide you with general information on caring for yourself after you leave the hospital. Your doctor may also give you specific instructions. While your treatment has been planned according to the most current medical practices available, unavoidable complications occasionally occur. If you have any problems or questions after discharge, please call your doctor. ACTIVITY You may resume your regular activity but move at a slower pace for the next 24 hours.  Take frequent rest periods for the next 24 hours.  Walking will help expel (get rid of) the air and reduce the bloated feeling in your abdomen.  No driving for 24 hours (because of the anesthesia (medicine) used during the test).  You may shower.  Do not sign any important legal documents or operate any machinery for 24 hours (because of the anesthesia used during the test).  NUTRITION Drink plenty of fluids.  You may resume your normal diet.  Begin with a light meal and progress to your normal diet.  Avoid alcoholic beverages for 24 hours or as instructed by your caregiver.  MEDICATIONS You may resume your normal medications unless your caregiver tells you otherwise.  WHAT YOU CAN EXPECT TODAY You may experience abdominal discomfort such as a feeling of fullness or "gas" pains.  FOLLOW-UP Your doctor will discuss the results of your test with you.  SEEK IMMEDIATE MEDICAL ATTENTION IF ANY OF THE FOLLOWING OCCUR: Excessive nausea (feeling sick to your stomach) and/or vomiting.  Severe abdominal pain and distention (swelling).  Trouble swallowing.  Temperature over 101 F (37.8 C).  Rectal bleeding or vomiting of blood.    Protonix  twice daily    I hope you have a great rest of your week!   Sarah Eaton Sarah Eaton Sarah Eaton Sarah Eaton Sarah Eaton , M.D.. Gastroenterology and Hepatology [facility]

## 2024-08-19 NOTE — Interval H&P Note (Signed)
 History and Physical Interval Note:  08/19/2024 10:29 AM  Sarah Eaton  has presented today for surgery, with the diagnosis of HEMATEMESIS, DYSPHAGIA.  The various methods of treatment have been discussed with the patient and family. After consideration of risks, benefits and other options for treatment, the patient has consented to  Procedure(s) with comments: EGD (ESOPHAGOGASTRODUODENOSCOPY) (N/A) - 11:30am, asa 1 as a surgical intervention.  The patient's history has been reviewed, patient examined, no change in status, stable for surgery.  I have reviewed the patient's chart and labs.  Questions were answered to the patient's satisfaction.     Deatrice FALCON Donyell Ding

## 2024-08-19 NOTE — Anesthesia Postprocedure Evaluation (Signed)
 Anesthesia Post Note  Patient: Sarah Eaton  Procedure(s) Performed: EGD (ESOPHAGOGASTRODUODENOSCOPY)  Patient location during evaluation: Phase II Anesthesia Type: General Level of consciousness: awake and alert Pain management: pain level controlled Vital Signs Assessment: post-procedure vital signs reviewed and stable Respiratory status: spontaneous breathing, nonlabored ventilation and respiratory function stable Cardiovascular status: stable Anesthetic complications: no   There were no known notable events for this encounter.   Last Vitals:  Vitals:   08/19/24 1126 08/19/24 1130  BP: (!) 97/55 105/63  Pulse: 71   Resp: 18   Temp: 36.7 C   SpO2: 100%     Last Pain:  Vitals:   08/19/24 1126  TempSrc: Oral  PainSc: 0-No pain                 Marcellino Fidalgo L [name]

## 2024-08-20 ENCOUNTER — Ambulatory Visit (INDEPENDENT_AMBULATORY_CARE_PROVIDER_SITE_OTHER): Payer: Self-pay | Admitting: Gastroenterology

## 2024-08-20 LAB — SURGICAL PATHOLOGY

## 2024-08-23 NOTE — Progress Notes (Signed)
 12 wk EGD noted in recall Patient result letter mailed Patient's PCP is on EPIC

## 2024-08-26 ENCOUNTER — Encounter (HOSPITAL_COMMUNITY): Payer: Self-pay | Admitting: Gastroenterology

## 2024-09-10 ENCOUNTER — Other Ambulatory Visit: Payer: Self-pay | Admitting: Internal Medicine

## 2024-09-10 DIAGNOSIS — F411 Generalized anxiety disorder: Secondary | ICD-10-CM

## 2024-09-10 DIAGNOSIS — F4323 Adjustment disorder with mixed anxiety and depressed mood: Secondary | ICD-10-CM

## 2024-09-13 ENCOUNTER — Other Ambulatory Visit (INDEPENDENT_AMBULATORY_CARE_PROVIDER_SITE_OTHER): Payer: Self-pay | Admitting: Gastroenterology

## 2024-09-14 ENCOUNTER — Encounter: Payer: Self-pay | Admitting: Internal Medicine

## 2024-09-14 ENCOUNTER — Other Ambulatory Visit: Payer: Self-pay | Admitting: Internal Medicine

## 2024-09-14 DIAGNOSIS — G43009 Migraine without aura, not intractable, without status migrainosus: Secondary | ICD-10-CM

## 2024-09-15 ENCOUNTER — Encounter: Payer: Self-pay | Admitting: Internal Medicine

## 2024-09-28 ENCOUNTER — Encounter: Payer: Self-pay | Admitting: Internal Medicine

## 2024-09-29 ENCOUNTER — Encounter (INDEPENDENT_AMBULATORY_CARE_PROVIDER_SITE_OTHER): Payer: Self-pay | Admitting: *Deleted

## 2024-09-30 ENCOUNTER — Encounter: Payer: Self-pay | Admitting: Internal Medicine

## 2024-09-30 ENCOUNTER — Other Ambulatory Visit: Payer: Self-pay | Admitting: Internal Medicine

## 2024-09-30 DIAGNOSIS — F411 Generalized anxiety disorder: Secondary | ICD-10-CM

## 2024-09-30 DIAGNOSIS — E66811 Obesity, class 1: Secondary | ICD-10-CM

## 2024-09-30 MED ORDER — BUSPIRONE HCL 10 MG PO TABS: 10.0000 mg | ORAL_TABLET | Freq: Three times a day (TID) | ORAL | 1 refills | Status: AC

## 2024-10-17 ENCOUNTER — Other Ambulatory Visit: Payer: Self-pay | Admitting: Internal Medicine

## 2024-10-17 DIAGNOSIS — E66811 Obesity, class 1: Secondary | ICD-10-CM

## 2024-10-18 ENCOUNTER — Encounter (INDEPENDENT_AMBULATORY_CARE_PROVIDER_SITE_OTHER): Payer: Self-pay

## 2024-10-18 MED ORDER — PHENTERMINE HCL 37.5 MG PO TABS: 18.7500 mg | ORAL_TABLET | Freq: Every day | ORAL | 1 refills | Status: AC

## 2024-10-18 NOTE — Addendum Note (Signed)
 Addended byBETHA TOBIE DOWNS on: 10/18/2024 12:06 PM   Modules accepted: Orders

## 2024-10-20 ENCOUNTER — Encounter: Payer: Self-pay | Admitting: Internal Medicine

## 2024-10-20 ENCOUNTER — Telehealth: Admitting: Internal Medicine

## 2024-10-20 DIAGNOSIS — F5101 Primary insomnia: Secondary | ICD-10-CM

## 2024-10-20 DIAGNOSIS — F4320 Adjustment disorder, unspecified: Secondary | ICD-10-CM

## 2024-10-20 DIAGNOSIS — F411 Generalized anxiety disorder: Secondary | ICD-10-CM | POA: Diagnosis not present

## 2024-10-20 DIAGNOSIS — F410 Panic disorder [episodic paroxysmal anxiety] without agoraphobia: Secondary | ICD-10-CM | POA: Diagnosis not present

## 2024-10-20 MED ORDER — LORAZEPAM 0.5 MG PO TABS: 0.5000 mg | ORAL_TABLET | Freq: Two times a day (BID) | ORAL | 0 refills | Status: AC | PRN

## 2024-10-20 NOTE — Assessment & Plan Note (Addendum)
 Could be due to anxiety Avoid caffeinated products in the evening Sleep hygiene material provided Takes Ambien  10 mg nightly, PDMP reviewed Vistaril  as needed for insomnia/anxiety

## 2024-10-20 NOTE — Assessment & Plan Note (Addendum)
 Uncontrolled recently On Wellbutrin  150 mg BID Continue BuSpar  10 mg TID Has tried Prozac  in the past Simple relaxation techniques advised, can try yoga Continue grief counseling through workplace

## 2024-10-20 NOTE — Assessment & Plan Note (Addendum)
 Gets grief counseling through her workplace If persistent, will need BH therapy referral for PTSD as well

## 2024-10-20 NOTE — Progress Notes (Signed)
 Virtual Visit via Video Note   Because of Eldene Plocher Bickley's co-morbid illnesses, she is at least at moderate risk for complications without adequate follow up.  This format is felt to be most appropriate for this patient at this time.  All issues noted in this document were discussed and addressed.  A limited physical exam was performed with this format.      Evaluation Performed:  Follow-up visit  Date:  10/20/2024   ID:  JOLISA INTRIAGO, DOB [birth year], MRN [id number]  Patient Location: Home Provider Location: Office/Clinic  Participants: Patient Location of Patient: Home Location of Provider: Telehealth Consent was obtain for visit to be over via telehealth. I verified that I am speaking with the correct person using two identifiers.  PCP:  Tobie Suzzane POUR, MD   Chief Complaint: Anxiety/panic disorder  History of Present Illness:    Sarah Eaton is a 38 y.o. female with PMH of migraine, GAD, obesity, Shingles and pyelonephritis who has a video visit for c/o recent worsening of anxiety.  She reports worsening of anxiety in the last week.  She had episodes of severe anxiety around Thanksgiving, which she attributes to holidays.  She is also feeling more anxious due to being close to 1 year of her boyfriend's death.  She had episode of chest tightness and palpitations in the last week.  Her mother gave her clonazepam, which improved her anxiety.  She has been taking Wellbutrin  150 mg twice daily and BuSpar  10 mg 3 times daily.  She also has hydroxyzine  50 mg for as needed use for anxiety, but has not helped recently.  She has felt low and apathy recently as well.  She is overall hopeful for recovery from her grief reaction.  The patient does not have symptoms concerning for COVID-19 infection (fever, chills, cough, or new shortness of breath).   Past Medical, Surgical, Social History, Allergies, and Medications have been Reviewed.  Past Medical History:  Diagnosis Date    Anemia    Hypertension    Obesity    Ovarian cyst    Pyelonephritis 05/04/2021   Sepsis due to Escherichia coli (E. coli) (HCC) 05/04/2021   Past Surgical History:  Procedure Laterality Date   CESAREAN SECTION     CESAREAN SECTION WITH BILATERAL TUBAL LIGATION Bilateral 08/05/2013   Procedure: REPEAT CESAREAN SECTION WITH BILATERAL TUBAL LIGATION;  Surgeon: Harland BROCKS Birkenhead, MD;  Location: [facility];  Service: Obstetrics;  Laterality: Bilateral;   ENDOMETRIAL ABLATION W/ NOVASURE  09/2014   ESOPHAGOGASTRODUODENOSCOPY N/A 08/19/2024   Procedure: EGD (ESOPHAGOGASTRODUODENOSCOPY);  Surgeon: Cinderella Deatrice FALCON, MD;  Location: [facility];  Service: Endoscopy;  Laterality: N/A;  11:30am, asa 1   LAPAROSCOPIC VAGINAL HYSTERECTOMY WITH SALPINGECTOMY N/A 04/01/2022   Procedure: LAPAROSCOPIC ASSISTED VAGINAL HYSTERECTOMY WITH SALPINGECTOMY;  Surgeon: Connell Davies, MD;  Location: [facility] ORS;  Service: Gynecology;  Laterality: N/A;   TUBAL LIGATION       Current Meds  Medication Sig   buPROPion  (WELLBUTRIN  SR) 150 MG 12 hr tablet TAKE 1 TABLET BY MOUTH TWICE A DAY   busPIRone  (BUSPAR ) 10 MG tablet Take 1 tablet (10 mg total) by mouth 3 (three) times daily.   fluticasone  (FLONASE ) 50 MCG/ACT nasal spray Place 1 spray into both nostrils 2 (two) times daily. (Patient taking differently: Place 1 spray into both nostrils as needed.)   hydrOXYzine  (VISTARIL ) 50 MG capsule TAKE 1 CAPSULE BY MOUTH AT BEDTIME AS NEEDED FOR ANXIETY/INSOMNIA   lubiprostone  (AMITIZA )  24 MCG capsule TAKE 1 CAPSULE (24 MCG TOTAL) BY MOUTH 2 (TWO) TIMES DAILY WITH A MEAL.   minoxidil  (LONITEN ) 2.5 MG tablet Take 0.5 tablets (1.25 mg total) by mouth daily. Take half tablet (1.25 mg) daily   pantoprazole  (PROTONIX ) 40 MG tablet Take 1 tablet (40 mg total) by mouth 2 (two) times daily.   phentermine  (ADIPEX-P ) 37.5 MG tablet Take 0.5 tablets (18.75 mg total) by mouth daily before breakfast.   rizatriptan  (MAXALT ) 10 MG tablet TAKE 1 TABLET  BY MOUTH AS NEEDED FOR MIGRAINE. MAY REPEAT IN 2 HOURS IF NEEDED   sucralfate  (CARAFATE ) 1 g tablet Take 1 tablet (1 g total) by mouth 4 (four) times daily -  with meals and at bedtime for 21 days.   topiramate  (TOPAMAX ) 50 MG tablet Take 1 tablet (50 mg total) by mouth 2 (two) times daily.   valACYclovir  (VALTREX ) 1000 MG tablet TAKE 1 TABLET (1,000 MG TOTAL) BY MOUTH 2 (TWO) TIMES DAILY. TAKE FOR TEN DAYS. (Patient taking differently: Take 1,000 mg by mouth 2 (two) times daily. Take for ten days. As needed per patient)   zolpidem  (AMBIEN ) 10 MG tablet Take 1 tablet (10 mg total) by mouth at bedtime as needed for sleep.     Allergies:   Banana and Tuna [fish allergy]   ROS:   Please see the history of present illness. All other systems reviewed and are negative.   Labs/Other Tests and Data Reviewed:    Recent Labs: 07/05/2024: ALT 16; BUN 18; Creatinine, Ser 0.77; Hemoglobin 12.3; Platelets 342; Potassium 3.7; Sodium 137 07/27/2024: TSH 1.270   Recent Lipid Panel Lab Results  Component Value Date/Time   CHOL 149 10/03/2023 09:29 AM   TRIG 42 10/03/2023 09:29 AM   HDL 60 10/03/2023 09:29 AM   CHOLHDL 2.5 10/03/2023 09:29 AM   LDLCALC 79 10/03/2023 09:29 AM    Wt Readings from Last 3 Encounters:  08/18/24 175 lb (79.4 kg)  08/17/24 170 lb (77.1 kg)  07/27/24 168 lb 11.2 oz (76.5 kg)     Objective:    Vital Signs:  LMP 02/16/2022    VITAL SIGNS:  reviewed GEN:  no acute distress EYES:  sclerae anicteric, EOMI - Extraocular Movements Intact RESPIRATORY:  normal respiratory effort, symmetric expansion NEURO:  alert and oriented x 3, no obvious focal deficit PSYCH:  Anxious mood.  ASSESSMENT & PLAN:    Assessment & Plan Panic disorder Recent worsening of anxiety, likely multifactorial Has had panic episodes recently as well She has tried clonazepam from her mother, but would avoid long-acting benzodiazepine for now Ativan as needed for severe anxiety/panic  episode Hydroxyzine  50 mg as needed for mild to moderate anxiety Advised to perform simple relaxation techniques GAD (generalized anxiety disorder) Uncontrolled recently On Wellbutrin  150 mg BID Continue BuSpar  10 mg TID Has tried Prozac  in the past Simple relaxation techniques advised, can try yoga Continue grief counseling through workplace Grief reaction Gets grief counseling through her workplace If persistent, will need BH therapy referral for PTSD as well Primary insomnia Could be due to anxiety Avoid caffeinated products in the evening Sleep hygiene material provided Takes Ambien  10 mg nightly, PDMP reviewed Vistaril  as needed for insomnia/anxiety    I discussed the assessment and treatment plan with the patient. The patient was provided an opportunity to ask questions, and all were answered. The patient agreed with the plan and demonstrated an understanding of the instructions.   The patient was advised to call back or  seek an in-person evaluation if the symptoms worsen or if the condition fails to improve as anticipated.  The above assessment and management plan was discussed with the patient. The patient verbalized understanding of and has agreed to the management plan.   Medication Adjustments/Labs and Tests Ordered: Current medicines are reviewed at length with the patient today.  Concerns regarding medicines are outlined above.   Tests Ordered: No orders of the defined types were placed in this encounter.   Medication Changes: No orders of the defined types were placed in this encounter.    Note: This dictation was prepared with Dragon dictation along with smaller phrase technology. Similar sounding words can be transcribed inadequately or may not be corrected upon review. Any transcriptional errors that result from this process are unintentional.      Disposition:  Follow up  Signed, Suzzane MARLA Blanch, MD  10/20/2024 4:34 PM     [facility]

## 2024-10-20 NOTE — Assessment & Plan Note (Addendum)
 Recent worsening of anxiety, likely multifactorial Has had panic episodes recently as well She has tried clonazepam from her mother, but would avoid long-acting benzodiazepine for now Ativan  as needed for severe anxiety/panic episode Hydroxyzine  50 mg as needed for mild to moderate anxiety Advised to perform simple relaxation techniques

## 2024-10-20 NOTE — Patient Instructions (Signed)
 Please take Ativan as needed for panic episode.  Please take Hydroxyzine  as needed for mild-moderate anxiety episode.  Please continue to take medications as prescribed.  Please continue to follow low carb diet and perform moderate exercise/walking at least 150 mins/week.

## 2024-10-26 ENCOUNTER — Ambulatory Visit (INDEPENDENT_AMBULATORY_CARE_PROVIDER_SITE_OTHER): Admitting: Gastroenterology

## 2024-10-26 ENCOUNTER — Encounter (INDEPENDENT_AMBULATORY_CARE_PROVIDER_SITE_OTHER): Payer: Self-pay | Admitting: Gastroenterology

## 2024-10-26 VITALS — BP 129/90 | HR 91 | Temp 98.2°F | Ht 62.0 in | Wt 170.0 lb

## 2024-10-26 DIAGNOSIS — K200 Eosinophilic esophagitis: Secondary | ICD-10-CM | POA: Insufficient documentation

## 2024-10-26 DIAGNOSIS — K219 Gastro-esophageal reflux disease without esophagitis: Secondary | ICD-10-CM

## 2024-10-26 DIAGNOSIS — K5904 Chronic idiopathic constipation: Secondary | ICD-10-CM

## 2024-10-26 DIAGNOSIS — R1310 Dysphagia, unspecified: Secondary | ICD-10-CM | POA: Insufficient documentation

## 2024-10-26 NOTE — Patient Instructions (Signed)
 We will continue with protonix  40mg  twice daily as well as amitiza  24mcg twice daily We will get you scheduled for repeat upper endoscopy Please continue to try and avoid cow milk protein as you are doing  Follow up 3 months  It was a pleasure to see you today. I want to create trusting relationships with patients and provide genuine, compassionate, and quality care. I truly value your feedback! please be on the lookout for a survey regarding your visit with me today. I appreciate your input about our visit and your time in completing this!    Raivyn Kabler L. Floree Zuniga, MSN, APRN, AGNP-C Adult-Gerontology Nurse Practitioner [facility]

## 2024-10-26 NOTE — Progress Notes (Signed)
 Referring Provider: Tobie Suzzane POUR, MD Primary Care Physician:  Tobie Suzzane POUR, MD Primary GI Physician: Dr. Cinderella   Chief Complaint  Patient presents with   Follow-up    Patient here today for a follow up on her hematemesis. She says this is doing well as long as she does not eat the wrong food.  She is still taking Protonix  40 mg bid, and Carafate  prn. Constipation doing well on Amitiza  24 mcg bid and gets plenty of liquids through out the day.   HPI:   Sarah Eaton is a 38 y.o. female with past medical history of anemia, HTN, ovarian cyst   Patient presenting today for:  Follow up of EOE, GERD,  dysphagia, constipation  Last seen September, at that time reported hematemesis x1 month, some chest pain, nausea. Started protonix  and carafate  which helped some. Feeling foods sitting in throat. Chronic constipation, possible black stools at time of hematemesis. On linzess  but going 2-3 weeks with a BM, though usually twice per week.   Recommended stop linzess , start amitiza  24mcg BID, schedule EGD, continue protonix  and carafate , avoid NSAIDs.  EGD 10/2  - Esophageal mucosal changes suspicious for                            eosinophilic esophagitis. Dilated.                           - 2 cm hiatal hernia.                           - Gastritis. Biopsied.                           - Normal duodenal bulb and second portion of the                            duodenum. Biopsied.                           - Biopsies were taken with a cold forceps for                            evaluation of eosinophilic esophagitis.  A. SMALL BOWEL BIOPSY:  -  Duodenal mucosa with no significant pathology.   B. STOMACH BIOPSY:  -  Antral mucosa with chemical/reactive change.  -  Oxyntic mucosa with no significant pathology.  -  No Helicobacter pylori organisms identified on HE stained slide.   C. DISTAL ESOPHAGEAL BIOPSY:  -  Squamous mucosa with basal cell hyperplasia, papillary elongation,   spongiosis and increased intraepithelial eosinophils (focally too  numerous to count, see part D.   D. PROXIMAL ESOPHAGEAL BIOPSY:  -  Squamous mucosa with basal cell hyperplasia, papillary elongation,  spongiosis and increased intraepithelial eosinophils (focally too  numerous to count, consistent with eosinophilic esophagitis in the  appropriate clinical/endoscopic setting.    Recommended repeat EGD 12 weeks, PPI BID, eliminate cow milk protein in diet  Present:  Doing much better on protonix  40mg  BID. She states she is using tums on rare occasion. Swallowing is improved as well. She is trying to avoid milk protein in her diet.   Constipation is doing better  on amitiza  24mcg BID. Trying to drink a lot of water. Having a BM atleast once per day with easy to pass stools. No rectal bleeding or melena. She has some occasional intermittent bloating/cramping, this is not often though. She is unsure if this is secondary to something she is eating or needing to have a BM, sometimes improves with defecation but not always.   Last Colonoscopy: never   Past Medical History:  Diagnosis Date   Anemia    Hypertension    Obesity    Ovarian cyst    Pyelonephritis 05/04/2021   Sepsis due to Escherichia coli (E. coli) (HCC) 05/04/2021    Past Surgical History:  Procedure Laterality Date   CESAREAN SECTION     CESAREAN SECTION WITH BILATERAL TUBAL LIGATION Bilateral 08/05/2013   Procedure: REPEAT CESAREAN SECTION WITH BILATERAL TUBAL LIGATION;  Surgeon: Harland JAYSON Birkenhead, MD;  Location: [facility];  Service: Obstetrics;  Laterality: Bilateral;   ENDOMETRIAL ABLATION W/ NOVASURE  09/2014   ESOPHAGOGASTRODUODENOSCOPY N/A 08/19/2024   Procedure: EGD (ESOPHAGOGASTRODUODENOSCOPY);  Surgeon: Cinderella Deatrice FALCON, MD;  Location: [facility];  Service: Endoscopy;  Laterality: N/A;  11:30am, asa 1   LAPAROSCOPIC VAGINAL HYSTERECTOMY WITH SALPINGECTOMY N/A 04/01/2022   Procedure: LAPAROSCOPIC ASSISTED VAGINAL  HYSTERECTOMY WITH SALPINGECTOMY;  Surgeon: Connell Davies, MD;  Location: [facility];  Service: Gynecology;  Laterality: N/A;   TUBAL LIGATION      Current Outpatient Medications  Medication Sig Dispense Refill   buPROPion  (WELLBUTRIN  SR) 150 MG 12 hr tablet TAKE 1 TABLET BY MOUTH TWICE A DAY 180 tablet 2   busPIRone  (BUSPAR ) 10 MG tablet Take 1 tablet (10 mg total) by mouth 3 (three) times daily. 270 tablet 1   fluticasone  (FLONASE ) 50 MCG/ACT nasal spray Place 1 spray into both nostrils 2 (two) times daily. (Patient taking differently: Place 1 spray into both nostrils as needed.) 16 g 2   hydrOXYzine  (VISTARIL ) 50 MG capsule TAKE 1 CAPSULE BY MOUTH AT BEDTIME AS NEEDED FOR ANXIETY/INSOMNIA 90 capsule 1   LORazepam  (ATIVAN ) 0.5 MG tablet Take 1 tablet (0.5 mg total) by mouth 2 (two) times daily as needed for anxiety. 20 tablet 0   lubiprostone  (AMITIZA ) 24 MCG capsule TAKE 1 CAPSULE (24 MCG TOTAL) BY MOUTH 2 (TWO) TIMES DAILY WITH A MEAL. 180 capsule 1   minoxidil  (LONITEN ) 2.5 MG tablet Take 0.5 tablets (1.25 mg total) by mouth daily. Take half tablet (1.25 mg) daily 45 tablet 1   Multiple Vitamins-Minerals (MULTIVITAMIN WITH MINERALS) tablet Take 1 tablet by mouth daily.     pantoprazole  (PROTONIX ) 40 MG tablet Take 1 tablet (40 mg total) by mouth 2 (two) times daily. 90 tablet 1   phentermine  (ADIPEX-P ) 37.5 MG tablet Take 0.5 tablets (18.75 mg total) by mouth daily before breakfast. 30 tablet 1   rizatriptan  (MAXALT ) 10 MG tablet TAKE 1 TABLET BY MOUTH AS NEEDED FOR MIGRAINE. MAY REPEAT IN 2 HOURS IF NEEDED 10 tablet 1   sucralfate  (CARAFATE ) 1 g tablet Take 1 tablet (1 g total) by mouth 4 (four) times daily -  with meals and at bedtime for 21 days. (Patient taking differently: Take 1 g by mouth as needed.) 84 tablet 0   topiramate  (TOPAMAX ) 50 MG tablet Take 1 tablet (50 mg total) by mouth 2 (two) times daily. 180 tablet 1   valACYclovir  (VALTREX ) 1000 MG tablet TAKE 1 TABLET (1,000 MG TOTAL)  BY MOUTH 2 (TWO) TIMES DAILY. TAKE FOR TEN DAYS. (Patient taking differently: Take  1,000 mg by mouth 2 (two) times daily.  As needed per patient) 20 tablet 3   zolpidem  (AMBIEN ) 10 MG tablet Take 1 tablet (10 mg total) by mouth at bedtime as needed for sleep. 30 tablet 3   No current facility-administered medications for this visit.    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   Richelle gums allergy] Anaphylaxis 07/26/2016    Social History   Socioeconomic History   Marital status: Legally Separated    Spouse name: Not on file   Number of children: Not on file   Years of education: Not on file   Highest education level: Not on file  Occupational History   Not on file  Tobacco Use   Smoking status: Never   Smokeless tobacco: Never  Vaping Use   Vaping status: Never Used  Substance and Sexual Activity   Alcohol use: Not Currently    Comment: occasionally   Drug use: Never   Sexual activity: Yes    Birth control/protection: Surgical  Other Topics Concern   Not on file  Social History Narrative   Live at home with children.     Social Drivers of Corporate Investment Banker Strain: Not on file  Food Insecurity: Not on file  Transportation Needs: Not on file  Physical Activity: Not on file  Stress: Not on file  Social Connections: Not on file    Review of systems General: negative for malaise, night sweats, fever, chills, weight loss Neck: Negative for lumps, goiter, pain and significant neck swelling Resp: Negative for cough, wheezing, dyspnea at rest CV: Negative for chest pain, leg swelling, palpitations, orthopnea GI: denies melena, hematochezia, nausea, vomiting, diarrhea, constipation, dysphagia, odyonophagia, early satiety or unintentional weight loss. +occasional abdominal pain  MSK: Negative for joint pain or swelling, back pain, and muscle pain. Derm: Negative for itching or rash Psych:  Denies depression, anxiety, memory loss, confusion. No homicidal or suicidal ideation.  Heme: Negative for prolonged bleeding, bruising easily, and swollen nodes. Endocrine: Negative for cold or heat intolerance, polyuria, polydipsia and goiter. Neuro: negative for tremor, gait imbalance, syncope and seizures. The remainder of the review of systems is noncontributory.  Physical Exam: BP (!) 129/90 (BP Location: Left Arm, Patient Position: Sitting, Cuff Size: Normal)   Pulse 91   Temp 98.2 F (36.8 C) (Oral)   Ht 5' 2 (1.575 m)   Wt 170 lb (77.1 kg)   LMP 02/16/2022   BMI 31.09 kg/m  General:   Alert and oriented. No distress noted. Pleasant and cooperative.  Head:  Normocephalic and atraumatic. Eyes:  Conjuctiva clear without scleral icterus. Mouth:  Oral mucosa pink and moist. Good dentition. No lesions. Heart: Normal rate and rhythm, s1 and s2 heart sounds present.  Lungs: Clear lung sounds in all lobes. Respirations equal and unlabored. Abdomen:  +BS, soft, non-tender and non-distended. No rebound or guarding. No HSM or masses noted. Derm: No palmar erythema or jaundice Msk:  Symmetrical without gross deformities. Normal posture. Extremities:  Without edema. Neurologic:  Alert and  oriented x4 Psych:  Alert and cooperative. Normal mood and affect.  Invalid input(s): 6 MONTHS   ASSESSMENT: Sarah Eaton is a 38 y.o. female presenting today for follow up of EOE, GERD, dysphagia and constipation   GERD: much improved on protonix  40mg  BID. Rarely needing any tums now for breakthrough. We will continue with PPI BID for now, continue to avoid trigger  foods, practice good reflux precautions  EOE/Dysphagia: recent EGD as above with EOE, PPI was increased to BID and she was advised to try and avoid cow milk protein which she has been doing. Feels dysphagia is much better. She is due for repeat EGD to evaluate for healing, we will get her scheduled for this. Indications, risks and  benefits of procedure discussed in detail with patient. Patient verbalized understanding and is in agreement to proceed with EGD at this time.   Constipation: resolved with use of amitiza  24mcg BID. Having a BM daily with easy to pass stools. Drinking a lot of water. No rectal bleeding or melena. Will continue with current bowel regimen, good water intake, high fiber diet.   PLAN:  -continue PPI BID -schedule EGD ASA II  -continue to avoid cow milk protein  -continue with amitiza  24mcg BID -continue good water intake, high fiber diet   All questions were answered, patient verbalized understanding and is in agreement with plan as outlined above.   Follow Up: 3 months   Sheketa Ende L. Amily Depp, MSN, APRN, AGNP-C Adult-Gerontology Nurse Practitioner [facility]

## 2024-10-26 NOTE — H&P (View-Only) (Signed)
 Referring Provider: Tobie Suzzane POUR, MD Primary Care Physician:  Tobie Suzzane POUR, MD Primary GI Physician: Dr. Cinderella   Chief Complaint  Patient presents with   Follow-up    Patient here today for a follow up on her hematemesis. She says this is doing well as long as she does not eat the wrong food.  She is still taking Protonix  40 mg bid, and Carafate  prn. Constipation doing well on Amitiza  24 mcg bid and gets plenty of liquids through out the day.   HPI:   Sarah Eaton is a 38 y.o. female with past medical history of anemia, HTN, ovarian cyst   Patient presenting today for:  Follow up of EOE, GERD,  dysphagia, constipation  Last seen September, at that time reported hematemesis x1 month, some chest pain, nausea. Started protonix  and carafate  which helped some. Feeling foods sitting in throat. Chronic constipation, possible black stools at time of hematemesis. On linzess  but going 2-3 weeks with a BM, though usually twice per week.   Recommended stop linzess , start amitiza  24mcg BID, schedule EGD, continue protonix  and carafate , avoid NSAIDs.  EGD 10/2  - Esophageal mucosal changes suspicious for                            eosinophilic esophagitis. Dilated.                           - 2 cm hiatal hernia.                           - Gastritis. Biopsied.                           - Normal duodenal bulb and second portion of the                            duodenum. Biopsied.                           - Biopsies were taken with a cold forceps for                            evaluation of eosinophilic esophagitis.  A. SMALL BOWEL BIOPSY:  -  Duodenal mucosa with no significant pathology.   B. STOMACH BIOPSY:  -  Antral mucosa with chemical/reactive change.  -  Oxyntic mucosa with no significant pathology.  -  No Helicobacter pylori organisms identified on HE stained slide.   C. DISTAL ESOPHAGEAL BIOPSY:  -  Squamous mucosa with basal cell hyperplasia, papillary elongation,   spongiosis and increased intraepithelial eosinophils (focally too  numerous to count, see part D.   D. PROXIMAL ESOPHAGEAL BIOPSY:  -  Squamous mucosa with basal cell hyperplasia, papillary elongation,  spongiosis and increased intraepithelial eosinophils (focally too  numerous to count, consistent with eosinophilic esophagitis in the  appropriate clinical/endoscopic setting.    Recommended repeat EGD 12 weeks, PPI BID, eliminate cow milk protein in diet  Present:  Doing much better on protonix  40mg  BID. She states she is using tums on rare occasion. Swallowing is improved as well. She is trying to avoid milk protein in her diet.   Constipation is doing better  on amitiza  24mcg BID. Trying to drink a lot of water. Having a BM atleast once per day with easy to pass stools. No rectal bleeding or melena. She has some occasional intermittent bloating/cramping, this is not often though. She is unsure if this is secondary to something she is eating or needing to have a BM, sometimes improves with defecation but not always.   Last Colonoscopy: never   Past Medical History:  Diagnosis Date   Anemia    Hypertension    Obesity    Ovarian cyst    Pyelonephritis 05/04/2021   Sepsis due to Escherichia coli (E. coli) (HCC) 05/04/2021    Past Surgical History:  Procedure Laterality Date   CESAREAN SECTION     CESAREAN SECTION WITH BILATERAL TUBAL LIGATION Bilateral 08/05/2013   Procedure: REPEAT CESAREAN SECTION WITH BILATERAL TUBAL LIGATION;  Surgeon: Harland JAYSON Birkenhead, MD;  Location: [facility];  Service: Obstetrics;  Laterality: Bilateral;   ENDOMETRIAL ABLATION W/ NOVASURE  09/2014   ESOPHAGOGASTRODUODENOSCOPY N/A 08/19/2024   Procedure: EGD (ESOPHAGOGASTRODUODENOSCOPY);  Surgeon: Cinderella Deatrice FALCON, MD;  Location: [facility];  Service: Endoscopy;  Laterality: N/A;  11:30am, asa 1   LAPAROSCOPIC VAGINAL HYSTERECTOMY WITH SALPINGECTOMY N/A 04/01/2022   Procedure: LAPAROSCOPIC ASSISTED VAGINAL  HYSTERECTOMY WITH SALPINGECTOMY;  Surgeon: Connell Davies, MD;  Location: [facility];  Service: Gynecology;  Laterality: N/A;   TUBAL LIGATION      Current Outpatient Medications  Medication Sig Dispense Refill   buPROPion  (WELLBUTRIN  SR) 150 MG 12 hr tablet TAKE 1 TABLET BY MOUTH TWICE A DAY 180 tablet 2   busPIRone  (BUSPAR ) 10 MG tablet Take 1 tablet (10 mg total) by mouth 3 (three) times daily. 270 tablet 1   fluticasone  (FLONASE ) 50 MCG/ACT nasal spray Place 1 spray into both nostrils 2 (two) times daily. (Patient taking differently: Place 1 spray into both nostrils as needed.) 16 g 2   hydrOXYzine  (VISTARIL ) 50 MG capsule TAKE 1 CAPSULE BY MOUTH AT BEDTIME AS NEEDED FOR ANXIETY/INSOMNIA 90 capsule 1   LORazepam  (ATIVAN ) 0.5 MG tablet Take 1 tablet (0.5 mg total) by mouth 2 (two) times daily as needed for anxiety. 20 tablet 0   lubiprostone  (AMITIZA ) 24 MCG capsule TAKE 1 CAPSULE (24 MCG TOTAL) BY MOUTH 2 (TWO) TIMES DAILY WITH A MEAL. 180 capsule 1   minoxidil  (LONITEN ) 2.5 MG tablet Take 0.5 tablets (1.25 mg total) by mouth daily. Take half tablet (1.25 mg) daily 45 tablet 1   Multiple Vitamins-Minerals (MULTIVITAMIN WITH MINERALS) tablet Take 1 tablet by mouth daily.     pantoprazole  (PROTONIX ) 40 MG tablet Take 1 tablet (40 mg total) by mouth 2 (two) times daily. 90 tablet 1   phentermine  (ADIPEX-P ) 37.5 MG tablet Take 0.5 tablets (18.75 mg total) by mouth daily before breakfast. 30 tablet 1   rizatriptan  (MAXALT ) 10 MG tablet TAKE 1 TABLET BY MOUTH AS NEEDED FOR MIGRAINE. MAY REPEAT IN 2 HOURS IF NEEDED 10 tablet 1   sucralfate  (CARAFATE ) 1 g tablet Take 1 tablet (1 g total) by mouth 4 (four) times daily -  with meals and at bedtime for 21 days. (Patient taking differently: Take 1 g by mouth as needed.) 84 tablet 0   topiramate  (TOPAMAX ) 50 MG tablet Take 1 tablet (50 mg total) by mouth 2 (two) times daily. 180 tablet 1   valACYclovir  (VALTREX ) 1000 MG tablet TAKE 1 TABLET (1,000 MG TOTAL)  BY MOUTH 2 (TWO) TIMES DAILY. TAKE FOR TEN DAYS. (Patient taking differently: Take  1,000 mg by mouth 2 (two) times daily.  As needed per patient) 20 tablet 3   zolpidem  (AMBIEN ) 10 MG tablet Take 1 tablet (10 mg total) by mouth at bedtime as needed for sleep. 30 tablet 3   No current facility-administered medications for this visit.    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   Richelle gums allergy] Anaphylaxis 07/26/2016    Social History   Socioeconomic History   Marital status: Legally Separated    Spouse name: Not on file   Number of children: Not on file   Years of education: Not on file   Highest education level: Not on file  Occupational History   Not on file  Tobacco Use   Smoking status: Never   Smokeless tobacco: Never  Vaping Use   Vaping status: Never Used  Substance and Sexual Activity   Alcohol use: Not Currently    Comment: occasionally   Drug use: Never   Sexual activity: Yes    Birth control/protection: Surgical  Other Topics Concern   Not on file  Social History Narrative   Live at home with children.     Social Drivers of Corporate Investment Banker Strain: Not on file  Food Insecurity: Not on file  Transportation Needs: Not on file  Physical Activity: Not on file  Stress: Not on file  Social Connections: Not on file    Review of systems General: negative for malaise, night sweats, fever, chills, weight loss Neck: Negative for lumps, goiter, pain and significant neck swelling Resp: Negative for cough, wheezing, dyspnea at rest CV: Negative for chest pain, leg swelling, palpitations, orthopnea GI: denies melena, hematochezia, nausea, vomiting, diarrhea, constipation, dysphagia, odyonophagia, early satiety or unintentional weight loss. +occasional abdominal pain  MSK: Negative for joint pain or swelling, back pain, and muscle pain. Derm: Negative for itching or rash Psych:  Denies depression, anxiety, memory loss, confusion. No homicidal or suicidal ideation.  Heme: Negative for prolonged bleeding, bruising easily, and swollen nodes. Endocrine: Negative for cold or heat intolerance, polyuria, polydipsia and goiter. Neuro: negative for tremor, gait imbalance, syncope and seizures. The remainder of the review of systems is noncontributory.  Physical Exam: BP (!) 129/90 (BP Location: Left Arm, Patient Position: Sitting, Cuff Size: Normal)   Pulse 91   Temp 98.2 F (36.8 C) (Oral)   Ht 5' 2 (1.575 m)   Wt 170 lb (77.1 kg)   LMP 02/16/2022   BMI 31.09 kg/m  General:   Alert and oriented. No distress noted. Pleasant and cooperative.  Head:  Normocephalic and atraumatic. Eyes:  Conjuctiva clear without scleral icterus. Mouth:  Oral mucosa pink and moist. Good dentition. No lesions. Heart: Normal rate and rhythm, s1 and s2 heart sounds present.  Lungs: Clear lung sounds in all lobes. Respirations equal and unlabored. Abdomen:  +BS, soft, non-tender and non-distended. No rebound or guarding. No HSM or masses noted. Derm: No palmar erythema or jaundice Msk:  Symmetrical without gross deformities. Normal posture. Extremities:  Without edema. Neurologic:  Alert and  oriented x4 Psych:  Alert and cooperative. Normal mood and affect.  Invalid input(s): 6 MONTHS   ASSESSMENT: Sarah Eaton is a 38 y.o. female presenting today for follow up of EOE, GERD, dysphagia and constipation   GERD: much improved on protonix  40mg  BID. Rarely needing any tums now for breakthrough. We will continue with PPI BID for now, continue to avoid trigger  foods, practice good reflux precautions  EOE/Dysphagia: recent EGD as above with EOE, PPI was increased to BID and she was advised to try and avoid cow milk protein which she has been doing. Feels dysphagia is much better. She is due for repeat EGD to evaluate for healing, we will get her scheduled for this. Indications, risks and  benefits of procedure discussed in detail with patient. Patient verbalized understanding and is in agreement to proceed with EGD at this time.   Constipation: resolved with use of amitiza  24mcg BID. Having a BM daily with easy to pass stools. Drinking a lot of water. No rectal bleeding or melena. Will continue with current bowel regimen, good water intake, high fiber diet.   PLAN:  -continue PPI BID -schedule EGD ASA II  -continue to avoid cow milk protein  -continue with amitiza  24mcg BID -continue good water intake, high fiber diet   All questions were answered, patient verbalized understanding and is in agreement with plan as outlined above.   Follow Up: 3 months   Sarah Ende L. Amily Depp, MSN, APRN, AGNP-C Adult-Gerontology Nurse Practitioner [facility]

## 2024-10-29 ENCOUNTER — Other Ambulatory Visit: Payer: Self-pay | Admitting: Internal Medicine

## 2024-10-29 DIAGNOSIS — B009 Herpesviral infection, unspecified: Secondary | ICD-10-CM

## 2024-11-08 ENCOUNTER — Telehealth (INDEPENDENT_AMBULATORY_CARE_PROVIDER_SITE_OTHER): Payer: Self-pay

## 2024-11-08 NOTE — Telephone Encounter (Signed)
 Spoke with patient, scheduled EGD for 11/23/2024 at 2:00pm. Instructions sent to [facility].

## 2024-11-08 NOTE — Telephone Encounter (Signed)
 Prior auth not required on [employer] for EGD

## 2024-11-14 ENCOUNTER — Other Ambulatory Visit: Payer: Self-pay | Admitting: Internal Medicine

## 2024-11-14 DIAGNOSIS — G43009 Migraine without aura, not intractable, without status migrainosus: Secondary | ICD-10-CM

## 2024-11-23 ENCOUNTER — Encounter (HOSPITAL_COMMUNITY)
Admission: RE | Disposition: A | Discharge status: Home / Self Care | Payer: Self-pay | Source: Home / Self Care | Attending: Gastroenterology

## 2024-11-23 ENCOUNTER — Other Ambulatory Visit: Payer: Self-pay

## 2024-11-23 ENCOUNTER — Ambulatory Visit (HOSPITAL_COMMUNITY)
Admission: RE | Admit: 2024-11-23 | Discharge: 2024-11-23 | Disposition: A | Discharge status: Home / Self Care | Attending: Gastroenterology | Admitting: Gastroenterology

## 2024-11-23 ENCOUNTER — Ambulatory Visit (HOSPITAL_COMMUNITY): Admitting: Anesthesiology

## 2024-11-23 ENCOUNTER — Encounter (HOSPITAL_COMMUNITY): Payer: Self-pay | Admitting: Gastroenterology

## 2024-11-23 DIAGNOSIS — K219 Gastro-esophageal reflux disease without esophagitis: Secondary | ICD-10-CM | POA: Insufficient documentation

## 2024-11-23 DIAGNOSIS — I10 Essential (primary) hypertension: Secondary | ICD-10-CM | POA: Insufficient documentation

## 2024-11-23 DIAGNOSIS — R1310 Dysphagia, unspecified: Secondary | ICD-10-CM | POA: Diagnosis not present

## 2024-11-23 DIAGNOSIS — K2970 Gastritis, unspecified, without bleeding: Secondary | ICD-10-CM | POA: Insufficient documentation

## 2024-11-23 DIAGNOSIS — K200 Eosinophilic esophagitis: Secondary | ICD-10-CM | POA: Diagnosis present

## 2024-11-23 DIAGNOSIS — K449 Diaphragmatic hernia without obstruction or gangrene: Secondary | ICD-10-CM | POA: Insufficient documentation

## 2024-11-23 HISTORY — PX: ESOPHAGOGASTRODUODENOSCOPY: SHX5428

## 2024-11-23 MED ORDER — PROPOFOL 10 MG/ML IV BOLUS
INTRAVENOUS | Status: DC | PRN
  Administered 2024-11-23: 100 mg via INTRAVENOUS

## 2024-11-23 MED ORDER — PROPOFOL 500 MG/50ML IV EMUL
INTRAVENOUS | Status: DC | PRN
  Administered 2024-11-23: 200 ug/kg/min via INTRAVENOUS

## 2024-11-23 MED ORDER — DUPIXENT 300 MG/2ML ~~LOC~~ SOAJ: 300.0000 mg | SUBCUTANEOUS | 2 refills | Status: AC

## 2024-11-23 MED ORDER — LIDOCAINE 2% (20 MG/ML) 5 ML SYRINGE
INTRAMUSCULAR | Status: DC | PRN
  Administered 2024-11-23: 60 mg via INTRAVENOUS

## 2024-11-23 MED ORDER — LACTATED RINGERS IV SOLN
INTRAVENOUS | Status: DC
  Administered 2024-11-23: 500 mL via INTRAVENOUS

## 2024-11-23 MED ORDER — LACTATED RINGERS IV SOLN: INTRAVENOUS | Status: DC

## 2024-11-23 NOTE — Transfer of Care (Signed)
 Immediate Anesthesia Transfer of Care Note  Patient: Sarah Eaton  Procedure(s) Performed: EGD (ESOPHAGOGASTRODUODENOSCOPY)  Patient Location: Endoscopy Unit  Anesthesia Type:General  Level of Consciousness: awake, alert , oriented, and patient cooperative  Airway & Oxygen  Therapy: Patient Spontanous Breathing  Post-op Assessment: Report given to RN, Post -op Vital signs reviewed and stable, and Patient moving all extremities X 4  Post vital signs: Reviewed and stable  Last Vitals:  Vitals Value Taken Time  BP 94/59 11/23/24 14:26  Temp 36.5 C 11/23/24 14:26  Pulse 89 11/23/24 14:26  Resp 20 11/23/24 14:26  SpO2 100 % 11/23/24 14:26    Last Pain:  Vitals:   11/23/24 1426  TempSrc: Oral  PainSc: 0-No pain      Patients Stated Pain Goal: 10 (11/23/24 1245)  Complications: No notable events documented.

## 2024-11-23 NOTE — Progress Notes (Signed)
 Sarah Eaton had a medical procedure on 11/23/2024 requiring anesthesia.  She may return to work on 11/25/2024.  Thank you [name].

## 2024-11-23 NOTE — Interval H&P Note (Signed)
 History and Physical Interval Note:  11/23/2024 12:55 PM  Sarah Eaton Sprang  has presented today for surgery, with the diagnosis of eosinophilic esophagitis.  The various methods of treatment have been discussed with the patient and family. After consideration of risks, benefits and other options for treatment, the patient has consented to  Procedures with comments: EGD (ESOPHAGOGASTRODUODENOSCOPY) (N/A) - 2:00pm, ASA 2 as a surgical intervention.  The patient's history has been reviewed, patient examined, no change in status, stable for surgery.  I have reviewed the patient's chart and labs.  Questions were answered to the patient's satisfaction.     Deatrice FALCON Kamaree Wheatley

## 2024-11-23 NOTE — Op Note (Signed)
 [facility] Patient Name: Sarah Eaton Procedure Date: 11/23/2024 2:05 PM MRN: [id number] Date of Birth: 1986/04/18 Attending MD: Deatrice Dine , MD, [id number] CSN: [id number] Age: 39 Admit Type: Outpatient Procedure:                Upper GI endoscopy Indications:              Follow-up of eosinophilic esophagitis, For therapy                            of eosinophilic esophagitis Providers:                Deatrice Dine, MD, Jon LABOR. Gerome RN, RN,                            Dorcas Lenis, Technician Referring MD:              Medicines:                Monitored Anesthesia Care Complications:            No immediate complications. Estimated Blood Loss:     Estimated blood loss was minimal. Procedure:                Pre-Anesthesia Assessment:                           - Prior to the procedure, a History and Physical                            was performed, and patient medications and                            allergies were reviewed. The patient's tolerance of                            previous anesthesia was also reviewed. The risks                            and benefits of the procedure and the sedation                            options and risks were discussed with the patient.                            All questions were answered, and informed consent                            was obtained. Prior Anticoagulants: The patient has                            taken no anticoagulant or antiplatelet agents. ASA                            Grade Assessment: II - A patient with mild systemic  disease. After reviewing the risks and benefits,                            the patient was deemed in satisfactory condition to                            undergo the procedure.                           After obtaining informed consent, the endoscope was                            passed under direct vision. Throughout the                            procedure,  the patient's blood pressure, pulse, and                            oxygen  saturations were monitored continuously. The                            [id number] ([id number]) Upper was introduced through                            the mouth, and advanced to the second part of                            duodenum. The upper GI endoscopy was accomplished                            without difficulty. The patient tolerated the                            procedure well. Scope In: 2:16:38 PM Scope Out: 2:21:59 PM Total Procedure Duration: 0 hours 5 minutes 21 seconds  Findings:      Mucosal changes including ringed esophagus, feline appearance,       longitudinal furrows and white plaques were found in the entire       esophagus. Esophageal findings were graded using the Eosinophilic       Esophagitis Endoscopic Reference Score (EoE-EREFS) as: Edema Grade 1       Present (decreased clarity or absence of vascular markings), Rings Grade       2 Moderate (distinct rings that do not occlude passage of diagnostic       8-10 mm endoscope), Exudates Grade 1 Mild (scattered white lesions       involving less than 10 percent of the esophageal surface area) and       Furrows Grade 1 Mild (vertical lines without visible depth). Biopsies       were obtained from the proximal and distal esophagus with cold forceps       for histology of suspected eosinophilic esophagitis.      A 2 cm hiatal hernia was present.      The gastric body was normal.      The duodenal bulb and second portion of the duodenum were normal. Impression:               -  Esophageal mucosal changes secondary to                            eosinophilic esophagitis.                           - 2 cm hiatal hernia.                           - Normal gastric body.                           - Normal duodenal bulb and second portion of the                            duodenum.                           - Biopsies were taken with a cold forceps for                             evaluation of eosinophilic esophagitis.                           -Persistent EOE despite high dose proton pump                            inhibitor therapy Moderate Sedation:      Per Anesthesia Care Recommendation:           - Patient has a contact number available for                            emergencies. The signs and symptoms of potential                            delayed complications were discussed with the                            patient. Return to normal activities tomorrow.                            Written discharge instructions were provided to the                            patient.                           - Resume previous diet.                           - Continue present medications.                           - Await pathology results.                           - Repeat upper endoscopy in 3  months for                            surveillance based on pathology results.                           - Return to GI clinic as previously scheduled.                           -Patient has failed high dose proton pump inhibitor                            therapy for EOE, next step would be to start                            Dupilumab  as her 2025 ACG EOE guidelines for PPI                            non-responders . Procedure Code(s):        --- Professional ---                           [phone], Esophagogastroduodenoscopy, flexible,                            transoral; with biopsy, single or multiple Diagnosis Code(s):        --- Professional ---                           K20.0, Eosinophilic esophagitis                           K44.9, Diaphragmatic hernia without obstruction or                            gangrene CPT copyright 2022 American Medical Association. All rights reserved. The codes documented in this report are preliminary and upon coder review may  be revised to meet current compliance requirements. Deatrice Dine, MD Deatrice Dine,  MD 11/23/2024 2:27:05 PM This report has been signed electronically. Number of Addenda: 0

## 2024-11-23 NOTE — Anesthesia Preprocedure Evaluation (Signed)
"                                    Anesthesia Evaluation  Patient identified by MRN, date of birth, ID band Patient awake    Reviewed: Allergy & Precautions, H&P , NPO status , Patient's Chart, lab work & pertinent test results, reviewed documented beta blocker date and time   Airway Mallampati: II  TM Distance: >3 FB Neck ROM: full    Dental no notable dental hx.    Pulmonary neg pulmonary ROS   Pulmonary exam normal breath sounds clear to auscultation       Cardiovascular Exercise Tolerance: Good hypertension,  Rhythm:regular Rate:Normal     Neuro/Psych  Headaches PSYCHIATRIC DISORDERS Anxiety        GI/Hepatic Neg liver ROS,GERD  ,,  Endo/Other  negative endocrine ROS    Renal/GU Renal disease  negative genitourinary   Musculoskeletal   Abdominal   Peds  Hematology  (+) Blood dyscrasia, anemia   Anesthesia Other Findings   Reproductive/Obstetrics negative OB ROS                              Anesthesia Physical Anesthesia Plan  ASA: 2  Anesthesia Plan: MAC   Post-op Pain Management:    Induction:   PONV Risk Score and Plan: Propofol  infusion  Airway Management Planned:   Additional Equipment:   Intra-op Plan:   Post-operative Plan:   Informed Consent: I have reviewed the patients History and Physical, chart, labs and discussed the procedure including the risks, benefits and alternatives for the proposed anesthesia with the patient or authorized representative who has indicated his/her understanding and acceptance.     Dental Advisory Given  Plan Discussed with: CRNA  Anesthesia Plan Comments:         Anesthesia Quick Evaluation  "

## 2024-11-23 NOTE — Discharge Instructions (Signed)
" °  Discharge instructions Please read the instructions outlined below and refer to this sheet in the next few weeks. These discharge instructions provide you with general information on caring for yourself after you leave the hospital. Your doctor may also give you specific instructions. While your treatment has been planned according to the most current medical practices available, unavoidable complications occasionally occur. If you have any problems or questions after discharge, please call your doctor. ACTIVITY You may resume your regular activity but move at a slower pace for the next 24 hours.  Take frequent rest periods for the next 24 hours.  Walking will help expel (get rid of) the air and reduce the bloated feeling in your abdomen.  No driving for 24 hours (because of the anesthesia (medicine) used during the test).  You may shower.  Do not sign any important legal documents or operate any machinery for 24 hours (because of the anesthesia used during the test).  NUTRITION Drink plenty of fluids.  You may resume your normal diet.  Begin with a light meal and progress to your normal diet.  Avoid alcoholic beverages for 24 hours or as instructed by your caregiver.  MEDICATIONS You may resume your normal medications unless your caregiver tells you otherwise.  WHAT YOU CAN EXPECT TODAY You may experience abdominal discomfort such as a feeling of fullness or gas pains.  FOLLOW-UP Your doctor will discuss the results of your test with you.  SEEK IMMEDIATE MEDICAL ATTENTION IF ANY OF THE FOLLOWING OCCUR: Excessive nausea (feeling sick to your stomach) and/or vomiting.  Severe abdominal pain and distention (swelling).  Trouble swallowing.  Temperature over 101 F (37.8 C).  Rectal bleeding or vomiting of blood.     Will Prescribe DUPIXENT   I hope you have a great rest of your week!   Aliyha Fornes Faizan Tytionna Cloyd , M.D.. Gastroenterology and Hepatology [facility]    "

## 2024-11-24 ENCOUNTER — Encounter (HOSPITAL_COMMUNITY): Payer: Self-pay | Admitting: Gastroenterology

## 2024-11-24 ENCOUNTER — Telehealth (INDEPENDENT_AMBULATORY_CARE_PROVIDER_SITE_OTHER): Payer: Self-pay | Admitting: Gastroenterology

## 2024-11-24 NOTE — Telephone Encounter (Signed)
 PA received for Dupixent  300mg /53mL. Attempted to complete PA; message popped up stating: Warning Our records indicate that this request has already been sent to the patient's insurance plan. To follow up, please call the plan directly.  Will call plan to follow up

## 2024-11-25 LAB — SURGICAL PATHOLOGY

## 2024-11-26 ENCOUNTER — Ambulatory Visit (INDEPENDENT_AMBULATORY_CARE_PROVIDER_SITE_OTHER): Payer: Self-pay | Admitting: Gastroenterology

## 2024-11-26 NOTE — Telephone Encounter (Signed)
 Contacted [facility] and spoke with Lexi. Lexi states there is a PA pending that was initiated yesterday. I asked was there anything that I needed to do at this time-Lexi states I could send any clinical information over to [phone]. Will fax recent office visit and procedure note.

## 2024-11-28 ENCOUNTER — Encounter (INDEPENDENT_AMBULATORY_CARE_PROVIDER_SITE_OTHER): Payer: Self-pay

## 2024-11-29 NOTE — Anesthesia Postprocedure Evaluation (Signed)
"   Anesthesia Post Note  Patient: Sarah Eaton  Procedure(s) Performed: EGD (ESOPHAGOGASTRODUODENOSCOPY)  Patient location during evaluation: Phase II Anesthesia Type: MAC Level of consciousness: awake Pain management: pain level controlled Vital Signs Assessment: post-procedure vital signs reviewed and stable Respiratory status: spontaneous breathing and respiratory function stable Cardiovascular status: blood pressure returned to baseline and stable Postop Assessment: no headache and no apparent nausea or vomiting Anesthetic complications: no Comments: Late entry   No notable events documented.   Last Vitals:  Vitals:   11/23/24 1431 11/23/24 1448  BP: 104/68 109/81  Pulse: 83 82  Resp: (!) 22 19  Temp:    SpO2: 100% 100%    Last Pain:  Vitals:   11/23/24 1431  TempSrc:   PainSc: 0-No pain                 Yvonna PARAS [place]      "

## 2024-11-29 NOTE — Telephone Encounter (Signed)
 PA updated via cover my meds

## 2024-11-29 NOTE — Telephone Encounter (Signed)
 Updated PA via cover my meds and clinical documentation sent.

## 2024-11-29 NOTE — Telephone Encounter (Signed)
 My chart message from patient received:  Good morning! [facility] reached out to me yesterday and left a message requesting I reach out because they have not had a response from you guys regarding requested information.  I also know how the pharmacies are as well. I was trying to get in touch with Dr Cinderella, but if you guys can handle it thats perfect. Also I think he sent it to [facility], I would like to pick it up in [place] once its approved but if its already started in [place] thats fine. I will make the trip I just live in [place] and work in [place]. Please let me know if there is anything I need to do on my end thanks!  Replied back to patient. Also re-faxed all information that was sent on Friday.

## 2024-11-29 NOTE — Telephone Encounter (Signed)
 Fax from [facility] stating please provide name of requested medication and refax Refax notes with note stating Patient is needing Dupixent  300mg /42mL

## 2024-11-30 ENCOUNTER — Encounter (INDEPENDENT_AMBULATORY_CARE_PROVIDER_SITE_OTHER): Payer: Self-pay

## 2024-11-30 NOTE — Telephone Encounter (Signed)
 Approval from [facility]:     Were pleased to let you know that weve approved your or your doctors request for coverage for Dupixent  Syn 300. You can now fill your prescription, and it will be covered according to your plan. As long as you remain covered by your prescription drug plan and there are no changes to your plan benefits, this request is approved from 11/30/2024 to 05/30/2025. When this approval expires, please speak to your doctor about your treatment. You will need to fill your specialty medications at a select participating pharmacy in your plans network.  Contacted [facility] and there will be a $300 copay. I have sent the patient a my chart message letting her know

## 2024-12-02 NOTE — Progress Notes (Signed)
 3 mth EGD noted in recall Patient result letter mailed Patient's PCP is on EPIC

## 2024-12-03 ENCOUNTER — Encounter: Payer: Self-pay | Admitting: Internal Medicine

## 2024-12-03 ENCOUNTER — Encounter (INDEPENDENT_AMBULATORY_CARE_PROVIDER_SITE_OTHER): Payer: Self-pay

## 2024-12-03 NOTE — Telephone Encounter (Signed)
 Sent pt my chart message checking to see if pt was able to reach [facility]

## 2024-12-03 NOTE — Telephone Encounter (Signed)
 Pt replied via my chart stating Yes I did! Thank you so much, per the pharmacy medication will be delivered today.

## 2024-12-09 ENCOUNTER — Encounter (INDEPENDENT_AMBULATORY_CARE_PROVIDER_SITE_OTHER): Payer: Self-pay

## 2024-12-20 ENCOUNTER — Ambulatory Visit: Payer: Self-pay | Admitting: Internal Medicine

## 2024-12-29 ENCOUNTER — Ambulatory Visit (INDEPENDENT_AMBULATORY_CARE_PROVIDER_SITE_OTHER): Admitting: Gastroenterology

## 2025-01-18 ENCOUNTER — Ambulatory Visit: Admitting: Internal Medicine

## 2025-01-19 ENCOUNTER — Ambulatory Visit
